# Patient Record
Sex: Female | Born: 1935
Health system: Southern US, Community
[De-identification: ages and names within clinical notes are randomized; demographics above are authoritative.]

## PROBLEM LIST (undated history)

## (undated) DIAGNOSIS — I4819 Other persistent atrial fibrillation: Secondary | ICD-10-CM

## (undated) DIAGNOSIS — E785 Hyperlipidemia, unspecified: Secondary | ICD-10-CM

## (undated) DIAGNOSIS — Z1211 Encounter for screening for malignant neoplasm of colon: Secondary | ICD-10-CM

## (undated) DIAGNOSIS — F329 Major depressive disorder, single episode, unspecified: Secondary | ICD-10-CM

## (undated) DIAGNOSIS — C801 Malignant (primary) neoplasm, unspecified: Secondary | ICD-10-CM

## (undated) DIAGNOSIS — R011 Cardiac murmur, unspecified: Secondary | ICD-10-CM

## (undated) DIAGNOSIS — K5732 Diverticulitis of large intestine without perforation or abscess without bleeding: Secondary | ICD-10-CM

## (undated) DIAGNOSIS — F32A Depression, unspecified: Secondary | ICD-10-CM

## (undated) HISTORY — DX: Encounter for screening for malignant neoplasm of colon: Z12.11

## (undated) HISTORY — DX: Diverticulitis of large intestine without perforation or abscess without bleeding: K57.32

## (undated) HISTORY — DX: Cardiac murmur, unspecified: R01.1

## (undated) HISTORY — DX: Malignant (primary) neoplasm, unspecified: C80.1

## (undated) HISTORY — DX: Depression, unspecified: F32.A

## (undated) HISTORY — DX: Major depressive disorder, single episode, unspecified: F32.9

## (undated) HISTORY — PX: CARDIAC CATHETERIZATION: SHX172

## (undated) HISTORY — DX: Hyperlipidemia, unspecified: E78.5

## (undated) HISTORY — PX: EYE SURGERY: SHX253

## (undated) HISTORY — DX: Other persistent atrial fibrillation: I48.19

---
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
## 1977-04-30 HISTORY — PX: ABDOMINAL HYSTERECTOMY: SHX81

## 1987-05-01 DIAGNOSIS — C801 Malignant (primary) neoplasm, unspecified: Secondary | ICD-10-CM

## 1987-05-01 HISTORY — DX: Malignant (primary) neoplasm, unspecified: C80.1

## 1987-05-01 HISTORY — PX: BREAST SURGERY: SHX581

## 2006-01-28 DIAGNOSIS — K5732 Diverticulitis of large intestine without perforation or abscess without bleeding: Secondary | ICD-10-CM

## 2006-01-28 HISTORY — DX: Diverticulitis of large intestine without perforation or abscess without bleeding: K57.32

## 2007-01-28 ENCOUNTER — Ambulatory Visit: Payer: Self-pay | Admitting: Internal Medicine

## 2007-04-09 ENCOUNTER — Ambulatory Visit: Payer: Self-pay | Admitting: Internal Medicine

## 2007-12-23 ENCOUNTER — Ambulatory Visit: Payer: Self-pay | Admitting: Internal Medicine

## 2008-03-11 ENCOUNTER — Ambulatory Visit: Payer: Self-pay | Admitting: Cardiovascular Disease

## 2008-03-12 ENCOUNTER — Emergency Department: Payer: Self-pay | Admitting: Emergency Medicine

## 2008-10-15 LAB — HM COLONOSCOPY

## 2010-01-24 ENCOUNTER — Ambulatory Visit: Payer: Self-pay | Admitting: Internal Medicine

## 2010-04-30 HISTORY — PX: CATARACT EXTRACTION, BILATERAL: SHX1313

## 2010-09-17 ENCOUNTER — Emergency Department: Payer: Self-pay | Admitting: Unknown Physician Specialty

## 2011-02-16 ENCOUNTER — Other Ambulatory Visit: Payer: Self-pay | Admitting: Internal Medicine

## 2011-03-02 ENCOUNTER — Other Ambulatory Visit (INDEPENDENT_AMBULATORY_CARE_PROVIDER_SITE_OTHER): Payer: Medicare Other | Admitting: *Deleted

## 2011-03-02 ENCOUNTER — Telehealth: Payer: Self-pay | Admitting: Internal Medicine

## 2011-03-02 DIAGNOSIS — N390 Urinary tract infection, site not specified: Secondary | ICD-10-CM

## 2011-03-02 LAB — POCT URINALYSIS DIPSTICK
Protein, UA: NEGATIVE
Spec Grav, UA: 1.025
Urobilinogen, UA: 0.2

## 2011-03-02 NOTE — Telephone Encounter (Signed)
Please call her back let her know that it is not a urinary tract infection.  If the back pain is severe and radiates to either leg,  She should be given an acute slot for next week. If it is not severe, and does not radiate, ask her to try tylenol 500 mg every 6 hours as needed for pain and one alleve daily and keep apt for 11/12

## 2011-03-02 NOTE — Telephone Encounter (Signed)
See below. Pt is coming in today to leave urine sample but wanted an appointment for her back pain.  She has not been the office here i made her an appointment on 03/12/11 pt wanted to know if she could be seen sooner

## 2011-03-02 NOTE — Telephone Encounter (Signed)
Patient came in for urine specimen, results have been entered and  it was neg.  Tried calling patient, but the only number that is listed in the chart has been disconnected.

## 2011-03-02 NOTE — Telephone Encounter (Signed)
[phone]   Pt called lower back pain.  Pt thinks it might be kidney infection

## 2011-03-05 NOTE — Telephone Encounter (Signed)
Patient notified. She says that the pain is too severe for her to wait until the 12th to be seen. I \\scheduled  for her to come in on wed this week to see Dr. Darrick Huntsman

## 2011-03-07 ENCOUNTER — Encounter: Payer: Self-pay | Admitting: Internal Medicine

## 2011-03-07 ENCOUNTER — Ambulatory Visit: Payer: Self-pay | Admitting: Internal Medicine

## 2011-03-07 ENCOUNTER — Ambulatory Visit: Payer: Medicare Other | Admitting: Internal Medicine

## 2011-03-07 ENCOUNTER — Ambulatory Visit (INDEPENDENT_AMBULATORY_CARE_PROVIDER_SITE_OTHER): Payer: Medicare Other | Admitting: Internal Medicine

## 2011-03-07 DIAGNOSIS — M25559 Pain in unspecified hip: Secondary | ICD-10-CM

## 2011-03-07 DIAGNOSIS — M545 Low back pain, unspecified: Secondary | ICD-10-CM

## 2011-03-07 DIAGNOSIS — E785 Hyperlipidemia, unspecified: Secondary | ICD-10-CM

## 2011-03-07 DIAGNOSIS — I10 Essential (primary) hypertension: Secondary | ICD-10-CM | POA: Insufficient documentation

## 2011-03-07 DIAGNOSIS — M810 Age-related osteoporosis without current pathological fracture: Secondary | ICD-10-CM | POA: Insufficient documentation

## 2011-03-07 DIAGNOSIS — M25551 Pain in right hip: Secondary | ICD-10-CM

## 2011-03-07 MED ORDER — TRAMADOL HCL 50 MG PO TABS: 50.0000 mg | ORAL_TABLET | Freq: Four times a day (QID) | ORAL | Status: AC | PRN

## 2011-03-07 MED ORDER — TRAMADOL HCL 50 MG PO TABS: 50.0000 mg | ORAL_TABLET | Freq: Four times a day (QID) | ORAL | Status: DC | PRN

## 2011-03-07 NOTE — Progress Notes (Signed)
  Subjective:    Patient ID: [name], female    DOB: [birth year], 77 y.o.   MRN: [id number]  HPI 77YO female with h/o osteoporosis presents with right hip and lower back pain. Pain had sudden onset when pt was sitting in reclining chair 1 week ago.  No known trauma to area. Pain in right lower back and posterior right hip. Radiates down right leg. No numbness or weakness noted. No loss of bowel or bladder continence. Pain described as severe, sharp, alleviated partially by ibuprofen.  No improvement in symptoms over last week. No other symptoms. No fever, chills.   Outpatient Encounter Prescriptions as of 03/07/2011  Medication Sig Dispense Refill  . acetaminophen (TYLENOL) 325 MG tablet Take 325 mg by mouth 3 (three) times daily.        Marland Kitchen alendronate (FOSAMAX) 70 MG tablet Take 70 mg by mouth every 7 (seven) days. Take with a full glass of water on an empty stomach.       Marland Kitchen amLODipine (NORVASC) 5 MG tablet Take 5 mg by mouth daily.        Marland Kitchen docusate sodium (COLACE) 100 MG capsule Take 100 mg by mouth daily.        . enalapril (VASOTEC) 20 MG tablet Take 20 mg by mouth daily.        Marland Kitchen ibuprofen (ADVIL,MOTRIN) 600 MG tablet Take 600 mg by mouth 3 (three) times daily as needed.        . NON FORMULARY Fiber cap one daily       . pravastatin (PRAVACHOL) 40 MG tablet Take 40 mg by mouth daily.        . benzonatate (TESSALON) 200 MG capsule TAKE ONE CAPSULE BY MOUTH THREE TIMES DAILY AS NEEDED FOR COUGH  60 capsule  1     Review of Systems  Constitutional: Negative for fever, chills and fatigue.  Respiratory: Negative for shortness of breath.   Cardiovascular: Negative for chest pain, palpitations and leg swelling.  Gastrointestinal: Negative for abdominal pain, diarrhea and constipation.  Genitourinary: Negative for dysuria, frequency and difficulty urinating.  Musculoskeletal: Positive for myalgias, back pain and arthralgias.  Skin: Negative for rash and wound.  Neurological: Negative  for dizziness and weakness.   BP 140/78  Pulse 95  Temp(Src) 97.7 F (36.5 C) (Oral)  Wt 126 lb (57.153 kg)  SpO2 96%     Objective:   Physical Exam  Constitutional: She appears well-developed and well-nourished. No distress.  HENT:  Head: Normocephalic and atraumatic.  Eyes: EOM are normal.  Neck: Normal range of motion.  Pulmonary/Chest: Effort normal and breath sounds normal.  Musculoskeletal:       Lumbar back: She exhibits decreased range of motion, tenderness and pain.       Back:  Neurological: She is alert. She has normal strength.  Reflex Scores:      Patellar reflexes are 2+ on the right side and 2+ on the left side. Skin: She is not diaphoretic.          Assessment & Plan:  1. Right hip and lower back pain - Sudden onset concerning for vertebral compression fracture. Will get plain films of lumbosacral spine and right hip.  Will add tramadol for breakthrough pain.  Will call pt with results.

## 2011-03-08 ENCOUNTER — Telehealth: Payer: Self-pay | Admitting: Internal Medicine

## 2011-03-08 DIAGNOSIS — R109 Unspecified abdominal pain: Secondary | ICD-10-CM

## 2011-03-08 NOTE — Telephone Encounter (Signed)
Xray of right hip and lumbar spine showed mild degenerative changes.  It also showed kidney stones in the right kidney. I question whether this might be causing her right hip/flank pain. I would like to get a urinalysis and a non-contrast CT abdomen to look at where this stone is and see if obstructive.

## 2011-03-08 NOTE — Telephone Encounter (Signed)
Patient informed, she wants to get urine at f/u OV on Monday. Referral entered.

## 2011-03-09 ENCOUNTER — Encounter: Payer: Self-pay | Admitting: Internal Medicine

## 2011-03-12 ENCOUNTER — Ambulatory Visit (INDEPENDENT_AMBULATORY_CARE_PROVIDER_SITE_OTHER): Payer: Medicare Other | Admitting: Internal Medicine

## 2011-03-12 ENCOUNTER — Encounter: Payer: Self-pay | Admitting: Internal Medicine

## 2011-03-12 VITALS — BP 156/68 | HR 90 | Temp 98.0°F | Resp 14 | Ht 67.0 in | Wt 124.5 lb

## 2011-03-12 DIAGNOSIS — M533 Sacrococcygeal disorders, not elsewhere classified: Secondary | ICD-10-CM

## 2011-03-12 DIAGNOSIS — Z1211 Encounter for screening for malignant neoplasm of colon: Secondary | ICD-10-CM | POA: Insufficient documentation

## 2011-03-12 DIAGNOSIS — F32A Depression, unspecified: Secondary | ICD-10-CM

## 2011-03-12 DIAGNOSIS — Z853 Personal history of malignant neoplasm of breast: Secondary | ICD-10-CM | POA: Insufficient documentation

## 2011-03-12 DIAGNOSIS — F329 Major depressive disorder, single episode, unspecified: Secondary | ICD-10-CM

## 2011-03-12 MED ORDER — CITALOPRAM HYDROBROMIDE 10 MG PO TABS: 10.0000 mg | ORAL_TABLET | Freq: Every day | ORAL | Status: DC

## 2011-03-12 NOTE — Patient Instructions (Signed)
Your pain is coming from your sacroiliac joint which appears to be having arthritis .  There are no signs of cancer on your x rays.   I suggest taking one ibuprofen 600 mg  And one acetominophen 325 mg every 6 hours if needed during the day .Marland Kitchen And save the tramadol for bedtime, which you may cut in half.   We are going to try low dose celexa to improve your appetite and mood.  Start with 1/2 tablet daily for one week, then increase to 1 tablet daily,     As long as you are not taking a laxative called bisacodyl,  Your bowel will not get addicted , but the only thing you should take on a daily basis is a fiber supplement like Metamucil, Fibercon, Miralax, , citrucel or Benefiber.  Try daily regimen of Fibercon and prunes for your bowels.

## 2011-03-12 NOTE — Progress Notes (Signed)
Subjective:    Patient ID: [name], female    DOB: 1931/10/16, 77 y.o.   MRN: [id number]  HPI  77 yo white female with histoyr of osteoporosis presents with sudden onset of severe stabbing pain in the Right flank/hip which occurring while sitting in her rocking chair 10 days ago,   Saw Dr. Dan Humphreys on Thursday and she was given tramadol for pain which has made her very drowsy but  allowed her to rest.  However she was having trouble functioning  during the day,  Has been using acetominophen alternatively .  Her pain has improved since last week and is episodic now, aggravated by prolonged stooping and sitting, and in the back of her mind is concerned about bone cancer since she has a history of breast cancer and has a strong FH of CA.  Her second complaint is lack of appetite. It is chronic and accompanied by dysphoric mood.  Past Medical History  Diagnosis Date  . Heart murmur   . Diverticulitis of colon Oct 2007    hosptialized at [facility], no surgery  . Cardiac arrhythmia   . Screening for colon cancer     last colonoscopy 2008: 2 polyps found, repeat due 2012  . Hyperlipidemia   . breast cancer 1989    s/p left mastectomy/chemo/tamoxifen x 6 yrs  . Osteoporosis 2005    Current Outpatient Prescriptions on File Prior to Visit  Medication Sig Dispense Refill  . acetaminophen (TYLENOL) 325 MG tablet Take 325 mg by mouth 3 (three) times daily.        Marland Kitchen alendronate (FOSAMAX) 70 MG tablet Take 70 mg by mouth every 7 (seven) days. Take with a full glass of water on an empty stomach.       Marland Kitchen amLODipine (NORVASC) 5 MG tablet Take 5 mg by mouth daily.        Marland Kitchen aspirin 81 MG tablet Take 81 mg by mouth daily.        . benzonatate (TESSALON) 200 MG capsule TAKE ONE CAPSULE BY MOUTH THREE TIMES DAILY AS NEEDED FOR COUGH  60 capsule  1  . calcium carbonate (OS-CAL) 600 MG TABS Take 600 mg by mouth 2 (two) times daily with a meal.        . cholecalciferol (VITAMIN D) 1000 UNITS tablet Take 1,000  Units by mouth daily.        Marland Kitchen docusate sodium (COLACE) 100 MG capsule Take 100 mg by mouth daily.        . enalapril (VASOTEC) 20 MG tablet Take 20 mg by mouth daily.        . eszopiclone (LUNESTA) 2 MG TABS Take 2 mg by mouth at bedtime. Take immediately before bedtime       . ibuprofen (ADVIL,MOTRIN) 600 MG tablet Take 600 mg by mouth 3 (three) times daily as needed.        . NON FORMULARY Fiber cap one daily       . nystatin (MYCOSTATIN) ointment Apply 1 application topically as needed.        Marland Kitchen omeprazole (PRILOSEC) 20 MG capsule Take 20 mg by mouth daily.        . pravastatin (PRAVACHOL) 40 MG tablet Take 40 mg by mouth daily.        Marland Kitchen triamcinolone (KENALOG) 0.1 % cream Apply 1 application topically 2 (two) times daily.          Review of Systems  Constitutional: Negative for fever, chills and fatigue.  Respiratory: Negative for shortness of breath.   Cardiovascular: Negative for chest pain, palpitations and leg swelling.  Gastrointestinal: Negative for abdominal pain, diarrhea and constipation.  Genitourinary: Negative for dysuria, frequency and difficulty urinating.  Musculoskeletal: Positive for myalgias, back pain and arthralgias.  Skin: Negative for rash and wound.  Neurological: Negative for dizziness and weakness.     BP 156/68  Pulse 90  Temp(Src) 98 F (36.7 C) (Oral)  Resp 14  Ht 5\' 7"  (1.702 m)  Wt 124 lb 8 oz (56.473 kg)  BMI 19.50 kg/m2  SpO2 97%  Objective:   Physical Exam  Constitutional: She appears well-developed and well-nourished. No distress.  HENT:  Head: Normocephalic and atraumatic.  Eyes: EOM are normal.  Neck: Normal range of motion.  Pulmonary/Chest: Effort normal and breath sounds normal.  Musculoskeletal:       Lumbar back: She exhibits decreased range of motion, tenderness and pain.       Back:  Neurological: She is alert. She has normal strength.  Reflex Scores:      Patellar reflexes are 2+ on the right side and 2+ on the left  side. Skin: She is not diaphoretic.          Assessment & Plan:  Sacroiliac joint pain:  Reassurance provided that there were no signs of cancer on her x ray, which was her primary concern.  Recommended regimen of analgesics and walking,  Avoidance of activities that require stopping or bending over. PT referral offered.   depressionL with anhedonia, loss of appetite.  Trial of celexa low dose. Return in one month.

## 2011-03-19 ENCOUNTER — Encounter: Payer: Self-pay | Admitting: Internal Medicine

## 2011-03-19 ENCOUNTER — Other Ambulatory Visit: Payer: Self-pay | Admitting: Internal Medicine

## 2011-03-19 DIAGNOSIS — R109 Unspecified abdominal pain: Secondary | ICD-10-CM

## 2011-03-21 ENCOUNTER — Telehealth: Payer: Self-pay | Admitting: Internal Medicine

## 2011-03-21 NOTE — Telephone Encounter (Signed)
A CT of what?

## 2011-03-21 NOTE — Telephone Encounter (Signed)
I have spoke with Cheyenne Torres, and she states she isn't having any pain so I will cancel her CT.

## 2011-03-21 NOTE — Telephone Encounter (Signed)
Sorry per the phone message on 03/08/11 Dr. Dan Humphreys, had sent her for an  Xray of right hip and lumbar spine showed mild degenerative changes. It also showed kidney stones in the right kidney. I question whether this might be causing her right hip/flank pain. I would like to get a urinalysis and a non-contrast CT abdomen to look at where this stone is and see if obstructive.  The patient wanted to make sure that you also agree with Dr. Dan Humphreys.

## 2011-03-21 NOTE — Telephone Encounter (Signed)
If she continues to have pain that is on one side or the other that is crampy, I would go ahead with the Ct abdomen and prlvis w/o contrast to rule out stones.

## 2011-03-28 ENCOUNTER — Encounter: Payer: Self-pay | Admitting: Internal Medicine

## 2011-04-05 ENCOUNTER — Other Ambulatory Visit: Payer: Self-pay | Admitting: Internal Medicine

## 2011-04-16 ENCOUNTER — Encounter: Payer: Self-pay | Admitting: Internal Medicine

## 2011-04-16 ENCOUNTER — Ambulatory Visit (INDEPENDENT_AMBULATORY_CARE_PROVIDER_SITE_OTHER): Payer: Medicare Other | Admitting: Internal Medicine

## 2011-04-16 VITALS — BP 138/72 | HR 84 | Temp 97.7°F | Ht 67.0 in | Wt 119.8 lb

## 2011-04-16 DIAGNOSIS — Z79899 Other long term (current) drug therapy: Secondary | ICD-10-CM

## 2011-04-16 DIAGNOSIS — E785 Hyperlipidemia, unspecified: Secondary | ICD-10-CM

## 2011-04-16 DIAGNOSIS — F320 Major depressive disorder, single episode, mild: Secondary | ICD-10-CM

## 2011-04-16 DIAGNOSIS — R5383 Other fatigue: Secondary | ICD-10-CM

## 2011-04-16 DIAGNOSIS — F329 Major depressive disorder, single episode, unspecified: Secondary | ICD-10-CM

## 2011-04-16 DIAGNOSIS — F32A Depression, unspecified: Secondary | ICD-10-CM

## 2011-04-16 DIAGNOSIS — R5381 Other malaise: Secondary | ICD-10-CM

## 2011-04-16 DIAGNOSIS — I10 Essential (primary) hypertension: Secondary | ICD-10-CM

## 2011-04-16 LAB — LIPID PANEL
Cholesterol: 139 mg/dL (ref 0–200)
HDL: 57.2 mg/dL
LDL Cholesterol: 60 mg/dL (ref 0–99)
Total CHOL/HDL Ratio: 2
Triglycerides: 107 mg/dL (ref 0.0–149.0)
VLDL: 21.4 mg/dL (ref 0.0–40.0)

## 2011-04-16 LAB — COMPREHENSIVE METABOLIC PANEL WITH GFR
ALT: 12 U/L (ref 0–35)
AST: 17 U/L (ref 0–37)
Albumin: 4.4 g/dL (ref 3.5–5.2)
Alkaline Phosphatase: 61 U/L (ref 39–117)
BUN: 27 mg/dL — ABNORMAL HIGH (ref 6–23)
CO2: 28 meq/L (ref 19–32)
Calcium: 9.9 mg/dL (ref 8.4–10.5)
Chloride: 103 meq/L (ref 96–112)
Creatinine, Ser: 0.9 mg/dL (ref 0.4–1.2)
GFR: 68.87 mL/min
Glucose, Bld: 132 mg/dL — ABNORMAL HIGH (ref 70–99)
Potassium: 4.2 meq/L (ref 3.5–5.1)
Sodium: 140 meq/L (ref 135–145)
Total Bilirubin: 0.5 mg/dL (ref 0.3–1.2)
Total Protein: 8.1 g/dL (ref 6.0–8.3)

## 2011-04-16 LAB — CBC WITH DIFFERENTIAL/PLATELET
Basophils Absolute: 0 10*3/uL (ref 0.0–0.1)
Eosinophils Relative: 0.8 % (ref 0.0–5.0)
HCT: 35.5 % — ABNORMAL LOW (ref 36.0–46.0)
Lymphocytes Relative: 31.4 % (ref 12.0–46.0)
Monocytes Relative: 5.7 % (ref 3.0–12.0)
Neutrophils Relative %: 61.6 % (ref 43.0–77.0)
Platelets: 231 10*3/uL (ref 150.0–400.0)
RDW: 13.5 % (ref 11.5–14.6)
WBC: 8.1 10*3/uL (ref 4.5–10.5)

## 2011-04-16 MED ORDER — TRIAMCINOLONE ACETONIDE 0.1 % EX CREA: 1.0000 "application " | TOPICAL_CREAM | Freq: Two times a day (BID) | CUTANEOUS | Status: DC

## 2011-04-16 MED ORDER — CITALOPRAM HYDROBROMIDE 10 MG PO TABS: 10.0000 mg | ORAL_TABLET | Freq: Every day | ORAL | Status: DC

## 2011-04-16 MED ORDER — CYANOCOBALAMIN 1000 MCG/ML IJ SOLN: 1000.0000 ug | INTRAMUSCULAR | Status: DC

## 2011-04-16 NOTE — Assessment & Plan Note (Addendum)
Managed with pravastatin, goal LDL < 100.  Repeat lipids and CMET  are due today.

## 2011-04-16 NOTE — Patient Instructions (Signed)
If you decided to taper yourself off oc citalopram this is how you do it:   Your blood pressure is fine,  No refills needed.  If the B12 shots give you energy,  Keep getting them once a month at the health Dept .  Ask the health Dept about a TDaP vaccine.

## 2011-04-16 NOTE — Assessment & Plan Note (Addendum)
improved symptomatology with trial of celexa.  She is sleeping better but continues to have fatigue.  Contineu celexa, encouraged tiral of b12 injections and daily exercise.

## 2011-04-16 NOTE — Progress Notes (Signed)
Subjective:    Patient ID: Cheyenne Torres, female    DOB: 1931-05-04, 77 y.o.   MRN: [id number]  HPI  Cheyenne Torres  returns for follow up on multiple chronic issues including hypertension,  DHD hip, and chronic fatigue with normla serologic workup.  At last visit I recommended a trial of celexa to see if the fatigue was really depression with anxiety features.  She is toleraing the celexa and feels tha it is helping her sleep.  With Christmas next week, she has No holiday optimism or cheer bc she always  Has to feed 20 realtives a  breakfast on Christmas Eve and worries that her daughter won't get to her seconda stop in time afterward.  States that it tires her out just thinking about it and she always sleeps the resot of the day.  Her hip pain has resolved, and is no longer giving her tourble.   She now worried about the present of asymptomatic renal calculi seen on recent study..   Past Medical History  Diagnosis Date  . Heart murmur   . Diverticulitis of colon Oct 2007    hosptialized at [facility], no surgery  . Cardiac arrhythmia   . Screening for colon cancer     last colonoscopy 2008: 2 polyps found, repeat due 2012  . Hyperlipidemia   . breast cancer 1989    s/p left mastectomy/chemo/tamoxifen x 6 yrs  . Osteoporosis 2005   Current Outpatient Prescriptions on File Prior to Visit  Medication Sig Dispense Refill  . acetaminophen (TYLENOL) 325 MG tablet Take 325 mg by mouth as needed.       Marland Kitchen alendronate (FOSAMAX) 70 MG tablet Take 70 mg by mouth every 7 (seven) days. Take with a full glass of water on an empty stomach.       Marland Kitchen amLODipine (NORVASC) 5 MG tablet Take 5 mg by mouth daily.        Marland Kitchen aspirin 81 MG tablet Take 81 mg by mouth daily.        . calcium carbonate (OS-CAL) 600 MG TABS Take 600 mg by mouth 2 (two) times daily with a meal.        . cholecalciferol (VITAMIN D) 1000 UNITS tablet Take 1,000 Units by mouth daily.        Marland Kitchen docusate sodium (COLACE) 100 MG capsule Take 100 mg by  mouth daily.        . enalapril (VASOTEC) 20 MG tablet Take 20 mg by mouth daily.        . NON FORMULARY Fiber cap one daily       . nystatin (MYCOSTATIN) ointment Apply 1 application topically as needed.        . pravastatin (PRAVACHOL) 40 MG tablet TAKE ONE TABLET BY MOUTH EVERY DAY AT BEDTIME  90 tablet  3  . benzonatate (TESSALON) 200 MG capsule TAKE ONE CAPSULE BY MOUTH THREE TIMES DAILY AS NEEDED FOR COUGH  60 capsule  1  . eszopiclone (LUNESTA) 2 MG TABS Take 2 mg by mouth at bedtime. Take immediately before bedtime       . ibuprofen (ADVIL,MOTRIN) 600 MG tablet Take 600 mg by mouth 3 (three) times daily as needed.        Marland Kitchen omeprazole (PRILOSEC) 20 MG capsule Take 20 mg by mouth daily.            Review of Systems  Constitutional: Positive for fatigue. Negative for fever, chills and unexpected weight change.  HENT: Negative  for hearing loss, ear pain, nosebleeds, congestion, sore throat, facial swelling, rhinorrhea, sneezing, mouth sores, trouble swallowing, neck pain, neck stiffness, voice change, postnasal drip, sinus pressure, tinnitus and ear discharge.   Eyes: Negative for pain, discharge, redness and visual disturbance.  Respiratory: Negative for cough, chest tightness, shortness of breath, wheezing and stridor.   Cardiovascular: Negative for chest pain, palpitations and leg swelling.  Musculoskeletal: Negative for myalgias and arthralgias.  Skin: Negative for color change and rash.  Neurological: Negative for dizziness, weakness, light-headedness and headaches.  Hematological: Negative for adenopathy.  Psychiatric/Behavioral: Negative for sleep disturbance.        BP 138/72  Pulse 84  Temp(Src) 97.7 F (36.5 C) (Oral)  Ht 5\' 7"  (1.702 m)  Wt 119 lb 12 oz (54.318 kg)  BMI 18.76 kg/m2     Objective:   Physical Exam  Constitutional: She is oriented to person, place, and time. She appears well-developed and well-nourished.  HENT:  Mouth/Throat: Oropharynx is clear  and moist.  Eyes: EOM are normal. Pupils are equal, round, and reactive to light. No scleral icterus.  Neck: Normal range of motion. Neck supple. No JVD present. No thyromegaly present.  Cardiovascular: Normal rate, regular rhythm, normal heart sounds and intact distal pulses.   Pulmonary/Chest: Effort normal and breath sounds normal.  Abdominal: Soft. Bowel sounds are normal. She exhibits no mass. There is no tenderness.  Musculoskeletal: Normal range of motion. She exhibits no edema.  Lymphadenopathy:    She has no cervical adenopathy.  Neurological: She is alert and oriented to person, place, and time.  Skin: Skin is warm and dry.  Psychiatric: She has a normal mood and affect.       Assessment & Plan:   Hyperlipidemia Managed with pravastatin, goal LDL < 100.  Repeat lipids and CMET  are due today.   Hypertension Well controlled with no side effects and ACE INhibitor and amlodipine  Depression, major, single episode, mild improved symptomatology with trial of celexa.  She is sleeping better but continues to have fatigue.  Contineu celexa, encouraged tiral of b12 injections and daily exercise.     Updated Medication List Outpatient Encounter Prescriptions as of 04/16/2011  Medication Sig Dispense Refill  . acetaminophen (TYLENOL) 325 MG tablet Take 325 mg by mouth as needed.       Marland Kitchen alendronate (FOSAMAX) 70 MG tablet Take 70 mg by mouth every 7 (seven) days. Take with a full glass of water on an empty stomach.       Marland Kitchen amLODipine (NORVASC) 5 MG tablet Take 5 mg by mouth daily.        Marland Kitchen aspirin 81 MG tablet Take 81 mg by mouth daily.        . calcium carbonate (OS-CAL) 600 MG TABS Take 600 mg by mouth 2 (two) times daily with a meal.        . cholecalciferol (VITAMIN D) 1000 UNITS tablet Take 1,000 Units by mouth daily.        . citalopram (CELEXA) 10 MG tablet Take 1 tablet (10 mg total) by mouth daily.  30 tablet  6  . docusate sodium (COLACE) 100 MG capsule Take 100 mg by  mouth daily.        . enalapril (VASOTEC) 20 MG tablet Take 20 mg by mouth daily.        . NON FORMULARY Fiber cap one daily       . nystatin (MYCOSTATIN) ointment Apply 1 application topically as needed.        Marland Kitchen  pravastatin (PRAVACHOL) 40 MG tablet TAKE ONE TABLET BY MOUTH EVERY DAY AT BEDTIME  90 tablet  3  . triamcinolone cream (KENALOG) 0.1 % Apply 1 application topically 2 (two) times daily. As needed for itching  30 g  3  . DISCONTD: citalopram (CELEXA) 10 MG tablet Take 1 tablet (10 mg total) by mouth daily.  30 tablet  2  . DISCONTD: triamcinolone (KENALOG) 0.1 % cream Apply 1 application topically 2 (two) times daily.        . benzonatate (TESSALON) 200 MG capsule TAKE ONE CAPSULE BY MOUTH THREE TIMES DAILY AS NEEDED FOR COUGH  60 capsule  1  . cyanocobalamin (,VITAMIN B-12,) 1000 MCG/ML injection Inject 1 mL (1,000 mcg total) into the muscle every 30 (thirty) days.  10 mL  2  . eszopiclone (LUNESTA) 2 MG TABS Take 2 mg by mouth at bedtime. Take immediately before bedtime       . ibuprofen (ADVIL,MOTRIN) 600 MG tablet Take 600 mg by mouth 3 (three) times daily as needed.        Marland Kitchen omeprazole (PRILOSEC) 20 MG capsule Take 20 mg by mouth daily.

## 2011-04-16 NOTE — Assessment & Plan Note (Signed)
Well controlled with no side effects and ACE INhibitor and amlodipine

## 2011-04-17 ENCOUNTER — Encounter: Payer: Self-pay | Admitting: Internal Medicine

## 2011-05-02 ENCOUNTER — Other Ambulatory Visit (INDEPENDENT_AMBULATORY_CARE_PROVIDER_SITE_OTHER): Payer: Medicare Other | Admitting: *Deleted

## 2011-05-02 DIAGNOSIS — E119 Type 2 diabetes mellitus without complications: Secondary | ICD-10-CM

## 2011-05-02 LAB — HEMOGLOBIN A1C
Hgb A1c MFr Bld: 6.5 % — ABNORMAL HIGH (ref <5.7)
Mean Plasma Glucose: 140 mg/dL — ABNORMAL HIGH (ref <117)

## 2011-05-02 NOTE — Progress Notes (Signed)
Addended by: Jobie Quaker on: 05/02/2011 10:53 AM   Modules accepted: Orders

## 2011-05-03 LAB — URINALYSIS
Glucose, UA: NEGATIVE mg/dL
Leukocytes, UA: NEGATIVE
Protein, ur: NEGATIVE mg/dL
pH: 6 (ref 5.0–8.0)

## 2011-05-03 LAB — MICROALBUMIN / CREATININE URINE RATIO
Creatinine, Urine: 189.8 mg/dL
Microalb Creat Ratio: 23.6 mg/g (ref 0.0–30.0)
Microalb, Ur: 4.47 mg/dL — ABNORMAL HIGH (ref 0.00–1.89)

## 2011-05-07 ENCOUNTER — Ambulatory Visit: Payer: Self-pay | Admitting: Internal Medicine

## 2011-06-09 ENCOUNTER — Emergency Department: Payer: Self-pay | Admitting: Emergency Medicine

## 2011-06-09 LAB — CBC
HCT: 33.3 % — ABNORMAL LOW (ref 35.0–47.0)
HGB: 11.2 g/dL — ABNORMAL LOW (ref 12.0–16.0)
MCHC: 33.5 g/dL (ref 32.0–36.0)
MCV: 87 fL (ref 80–100)
Platelet: 196 10*3/uL (ref 150–440)
RBC: 3.85 10*6/uL (ref 3.80–5.20)
WBC: 8.6 10*3/uL (ref 3.6–11.0)

## 2011-06-09 LAB — URINALYSIS, COMPLETE
Bacteria: NONE SEEN
Ketone: NEGATIVE
Ph: 5 (ref 4.5–8.0)
Protein: 30
RBC,UR: 35 /HPF (ref 0–5)
Specific Gravity: 1.018 (ref 1.003–1.030)

## 2011-06-09 LAB — COMPREHENSIVE METABOLIC PANEL
Albumin: 4 g/dL (ref 3.4–5.0)
Alkaline Phosphatase: 58 U/L (ref 50–136)
Anion Gap: 11 (ref 7–16)
BUN: 20 mg/dL — ABNORMAL HIGH (ref 7–18)
Bilirubin,Total: 0.4 mg/dL (ref 0.2–1.0)
Calcium, Total: 8.8 mg/dL (ref 8.5–10.1)
Co2: 28 mmol/L (ref 21–32)
Creatinine: 0.82 mg/dL (ref 0.60–1.30)
EGFR (Non-African Amer.): >60
Glucose: 123 mg/dL — ABNORMAL HIGH (ref 65–99)
Osmolality: 285 (ref 275–301)
Potassium: 3.4 mmol/L — ABNORMAL LOW (ref 3.5–5.1)
Sodium: 141 mmol/L (ref 136–145)
Total Protein: 7.9 g/dL (ref 6.4–8.2)

## 2011-06-09 LAB — LIPASE, BLOOD: Lipase: 67 U/L — ABNORMAL LOW (ref 73–393)

## 2011-06-18 ENCOUNTER — Ambulatory Visit (INDEPENDENT_AMBULATORY_CARE_PROVIDER_SITE_OTHER): Payer: Medicare Other | Admitting: Internal Medicine

## 2011-06-18 ENCOUNTER — Encounter: Payer: Self-pay | Admitting: Internal Medicine

## 2011-06-18 VITALS — BP 122/50 | HR 80 | Temp 98.2°F | Wt 119.0 lb

## 2011-06-18 DIAGNOSIS — F32A Depression, unspecified: Secondary | ICD-10-CM

## 2011-06-18 DIAGNOSIS — F329 Major depressive disorder, single episode, unspecified: Secondary | ICD-10-CM

## 2011-06-18 DIAGNOSIS — N200 Calculus of kidney: Secondary | ICD-10-CM

## 2011-06-18 DIAGNOSIS — K8020 Calculus of gallbladder without cholecystitis without obstruction: Secondary | ICD-10-CM

## 2011-06-18 HISTORY — DX: Calculus of kidney: N20.0

## 2011-06-18 MED ORDER — ESZOPICLONE 2 MG PO TABS: 2.0000 mg | ORAL_TABLET | Freq: Every day | ORAL | Status: DC

## 2011-06-18 MED ORDER — AMLODIPINE BESYLATE 5 MG PO TABS: 5.0000 mg | ORAL_TABLET | Freq: Every day | ORAL | Status: DC

## 2011-06-18 MED ORDER — CITALOPRAM HYDROBROMIDE 10 MG PO TABS: 10.0000 mg | ORAL_TABLET | Freq: Every day | ORAL | Status: DC

## 2011-06-18 NOTE — Progress Notes (Signed)
Subjective:    Patient ID: Cheyenne Torres, female    DOB: [birth year], 78 y.o.   MRN: [id number]  HPI  Ms. Cheyenne Torres is a 78 yr old white female with a history of hypertension and diabetes who presents for an ER followup.  On Saturday Feb 9she went to the ER with suprapubic pain and dysuria and was evaluated  For presumed kidney stone.  She was treated with tramadol and ciprofloxacin for pyuria.  Noncontrasted CT did not reveal any renal calculi or diverticulitis.  Gallstones were noted but her pain was not RUQ and LFTs were normal .   Past Medical History  Diagnosis Date  . Heart murmur   . Diverticulitis of colon Oct 2007    hosptialized at [facility], no surgery  . Cardiac arrhythmia   . Screening for colon cancer     last colonoscopy 2008: 2 polyps found, repeat due 2012  . Hyperlipidemia   . breast cancer 1989    s/p left mastectomy/chemo/tamoxifen x 6 yrs  . Osteoporosis 2005   Current Outpatient Prescriptions on File Prior to Visit  Medication Sig Dispense Refill  . acetaminophen (TYLENOL) 325 MG tablet Take 325 mg by mouth as needed.       Marland Kitchen alendronate (FOSAMAX) 70 MG tablet Take 70 mg by mouth every 7 (seven) days. Take with a full glass of water on an empty stomach.       Marland Kitchen aspirin 81 MG tablet Take 81 mg by mouth daily.        . benzonatate (TESSALON) 200 MG capsule TAKE ONE CAPSULE BY MOUTH THREE TIMES DAILY AS NEEDED FOR COUGH  60 capsule  1  . calcium carbonate (OS-CAL) 600 MG TABS Take 600 mg by mouth 2 (two) times daily with a meal.        . cholecalciferol (VITAMIN D) 1000 UNITS tablet Take 1,000 Units by mouth daily.        . cyanocobalamin (,VITAMIN B-12,) 1000 MCG/ML injection Inject 1 mL (1,000 mcg total) into the muscle every 30 (thirty) days.  10 mL  2  . docusate sodium (COLACE) 100 MG capsule Take 100 mg by mouth daily.        . enalapril (VASOTEC) 20 MG tablet Take 20 mg by mouth daily.        Marland Kitchen ibuprofen (ADVIL,MOTRIN) 600 MG tablet Take 600 mg by mouth 3 (three) times  daily as needed.        . NON FORMULARY Fiber cap one daily       . nystatin (MYCOSTATIN) ointment Apply 1 application topically as needed.        Marland Kitchen omeprazole (PRILOSEC) 20 MG capsule Take 20 mg by mouth daily.        . pravastatin (PRAVACHOL) 40 MG tablet TAKE ONE TABLET BY MOUTH EVERY DAY AT BEDTIME  90 tablet  3  . triamcinolone cream (KENALOG) 0.1 % Apply 1 application topically 2 (two) times daily. As needed for itching  30 g  3    Review of Systems  Constitutional: Negative for fever, chills and unexpected weight change.  HENT: Negative for hearing loss, ear pain, nosebleeds, congestion, sore throat, facial swelling, rhinorrhea, sneezing, mouth sores, trouble swallowing, neck pain, neck stiffness, voice change, postnasal drip, sinus pressure, tinnitus and ear discharge.   Eyes: Negative for pain, discharge, redness and visual disturbance.  Respiratory: Negative for cough, chest tightness, shortness of breath, wheezing and stridor.   Cardiovascular: Negative for chest pain, palpitations and leg  swelling.  Musculoskeletal: Negative for myalgias and arthralgias.  Skin: Negative for color change and rash.  Neurological: Negative for dizziness, weakness, light-headedness and headaches.  Hematological: Negative for adenopathy.       Objective:   Physical Exam  Constitutional: She is oriented to person, place, and time. She appears well-developed and well-nourished.  HENT:  Mouth/Throat: Oropharynx is clear and moist.  Eyes: EOM are normal. Pupils are equal, round, and reactive to light. No scleral icterus.  Neck: Normal range of motion. Neck supple. No JVD present. No thyromegaly present.  Cardiovascular: Normal rate, regular rhythm, normal heart sounds and intact distal pulses.   Pulmonary/Chest: Effort normal and breath sounds normal.  Abdominal: Soft. Bowel sounds are normal. She exhibits no mass. There is no tenderness.  Musculoskeletal: Normal range of motion. She exhibits no  edema.  Lymphadenopathy:    She has no cervical adenopathy.  Neurological: She is alert and oriented to person, place, and time.  Skin: Skin is warm and dry.  Psychiatric: She has a normal mood and affect.      Assessment & Plan:   Nephrolithiasis Suspected.  I reviewed the CT scan and labs done by ER physician, She was presumed to have passed the stone prior to ER evaluation with CT scan given abnormal UA , signs and symptoms and negative urine culture. Advised to maintain hydration.  Gallstones without obstruction of gallbladder Incidental finding on CT done Feb 9 during ER visit for suprapubic pain .  Reassurance provided.    Updated Medication List Outpatient Encounter Prescriptions as of 06/18/2011  Medication Sig Dispense Refill  . acetaminophen (TYLENOL) 325 MG tablet Take 325 mg by mouth as needed.       Marland Kitchen alendronate (FOSAMAX) 70 MG tablet Take 70 mg by mouth every 7 (seven) days. Take with a full glass of water on an empty stomach.       Marland Kitchen amLODipine (NORVASC) 5 MG tablet Take 1 tablet (5 mg total) by mouth daily.  90 tablet  3  . aspirin 81 MG tablet Take 81 mg by mouth daily.        . benzonatate (TESSALON) 200 MG capsule TAKE ONE CAPSULE BY MOUTH THREE TIMES DAILY AS NEEDED FOR COUGH  60 capsule  1  . calcium carbonate (OS-CAL) 600 MG TABS Take 600 mg by mouth 2 (two) times daily with a meal.        . cholecalciferol (VITAMIN D) 1000 UNITS tablet Take 1,000 Units by mouth daily.        . ciprofloxacin (CIPRO) 500 MG tablet Take 500 mg by mouth 2 (two) times daily.      . citalopram (CELEXA) 10 MG tablet Take 1 tablet (10 mg total) by mouth daily.  90 tablet  3  . cyanocobalamin (,VITAMIN B-12,) 1000 MCG/ML injection Inject 1 mL (1,000 mcg total) into the muscle every 30 (thirty) days.  10 mL  2  . docusate sodium (COLACE) 100 MG capsule Take 100 mg by mouth daily.        . enalapril (VASOTEC) 20 MG tablet Take 20 mg by mouth daily.        . eszopiclone (LUNESTA) 2 MG TABS  Take 1 tablet (2 mg total) by mouth at bedtime. Take immediately before bedtime  90 tablet  2  . ibuprofen (ADVIL,MOTRIN) 600 MG tablet Take 600 mg by mouth 3 (three) times daily as needed.        . NON FORMULARY Fiber cap one daily       .  nystatin (MYCOSTATIN) ointment Apply 1 application topically as needed.        Marland Kitchen omeprazole (PRILOSEC) 20 MG capsule Take 20 mg by mouth daily.        . pravastatin (PRAVACHOL) 40 MG tablet TAKE ONE TABLET BY MOUTH EVERY DAY AT BEDTIME  90 tablet  3  . traMADol (ULTRAM) 50 MG tablet Take 50 mg by mouth every 6 (six) hours as needed.      . triamcinolone cream (KENALOG) 0.1 % Apply 1 application topically 2 (two) times daily. As needed for itching  30 g  3  . DISCONTD: amLODipine (NORVASC) 5 MG tablet Take 5 mg by mouth daily.        Marland Kitchen DISCONTD: citalopram (CELEXA) 10 MG tablet Take 1 tablet (10 mg total) by mouth daily.  30 tablet  6  . DISCONTD: eszopiclone (LUNESTA) 2 MG TABS Take 2 mg by mouth at bedtime. Take immediately before bedtime

## 2011-06-18 NOTE — Assessment & Plan Note (Addendum)
Suspected.  I reviewed the CT scan and labs done by ER physician, She was presumed to have passed the stone prior to ER evaluation with CT scan given abnormal UA , signs and symptoms and negative urine culture. Advised to maintain hydration.

## 2011-06-18 NOTE — Assessment & Plan Note (Signed)
Incidental finding on CT done Feb 9 during ER visit for suprapubic pain .  Reassurance provided.

## 2011-06-18 NOTE — Patient Instructions (Signed)
You evidently passed a kidney stone before you had your CT scan. You can stop the ciprofloxacin now,  Hold onto those remaining tablets for  the next time .    Buy some G2 (it is gatorade without al  The key to preventing more kidney stones is staying well hydrated

## 2011-10-07 LAB — HM DIABETES EYE EXAM: HM Diabetic Eye Exam: NORMAL

## 2011-10-16 ENCOUNTER — Ambulatory Visit (INDEPENDENT_AMBULATORY_CARE_PROVIDER_SITE_OTHER): Payer: Medicare Other | Admitting: Internal Medicine

## 2011-10-16 ENCOUNTER — Encounter: Payer: Self-pay | Admitting: Internal Medicine

## 2011-10-16 VITALS — BP 140/66 | HR 79 | Temp 98.0°F | Resp 16 | Wt 112.5 lb

## 2011-10-16 DIAGNOSIS — E785 Hyperlipidemia, unspecified: Secondary | ICD-10-CM

## 2011-10-16 DIAGNOSIS — E559 Vitamin D deficiency, unspecified: Secondary | ICD-10-CM

## 2011-10-16 DIAGNOSIS — Z8719 Personal history of other diseases of the digestive system: Secondary | ICD-10-CM

## 2011-10-16 DIAGNOSIS — C801 Malignant (primary) neoplasm, unspecified: Secondary | ICD-10-CM

## 2011-10-16 DIAGNOSIS — R634 Abnormal weight loss: Secondary | ICD-10-CM

## 2011-10-16 DIAGNOSIS — N951 Menopausal and female climacteric states: Secondary | ICD-10-CM

## 2011-10-16 DIAGNOSIS — I10 Essential (primary) hypertension: Secondary | ICD-10-CM

## 2011-10-16 DIAGNOSIS — K8020 Calculus of gallbladder without cholecystitis without obstruction: Secondary | ICD-10-CM

## 2011-10-16 DIAGNOSIS — E119 Type 2 diabetes mellitus without complications: Secondary | ICD-10-CM

## 2011-10-16 DIAGNOSIS — F320 Major depressive disorder, single episode, mild: Secondary | ICD-10-CM

## 2011-10-16 DIAGNOSIS — R232 Flushing: Secondary | ICD-10-CM

## 2011-10-16 LAB — CBC WITH DIFFERENTIAL/PLATELET
Basophils Relative: 0.7 % (ref 0.0–3.0)
Eosinophils Relative: 2.3 % (ref 0.0–5.0)
Hemoglobin: 11.7 g/dL — ABNORMAL LOW (ref 12.0–15.0)
Lymphocytes Relative: 34.8 % (ref 12.0–46.0)
MCHC: 33.2 g/dL (ref 30.0–36.0)
Monocytes Relative: 6.3 % (ref 3.0–12.0)
Neutro Abs: 3.8 10*3/uL (ref 1.4–7.7)
RBC: 4.06 Mil/uL (ref 3.87–5.11)

## 2011-10-16 LAB — COMPLETE METABOLIC PANEL WITH GFR
ALT: 9 U/L (ref 0–35)
CO2: 27 mEq/L (ref 19–32)
Creat: 0.83 mg/dL (ref 0.50–1.10)
GFR, Est African American: 79 mL/min
Total Bilirubin: 0.5 mg/dL (ref 0.3–1.2)

## 2011-10-16 LAB — TSH: TSH: 2.14 u[IU]/mL (ref 0.35–5.50)

## 2011-10-16 LAB — LIPID PANEL
HDL: 59.2 mg/dL (ref >39.00)
LDL Cholesterol: 45 mg/dL (ref 0–99)
Total CHOL/HDL Ratio: 2
Triglycerides: 137 mg/dL (ref 0.0–149.0)
VLDL: 27.4 mg/dL (ref 0.0–40.0)

## 2011-10-16 MED ORDER — ENALAPRIL MALEATE 20 MG PO TABS: 20.0000 mg | ORAL_TABLET | Freq: Every day | ORAL | Status: DC

## 2011-10-16 MED ORDER — CITALOPRAM HYDROBROMIDE 20 MG PO TABS: 20.0000 mg | ORAL_TABLET | Freq: Every day | ORAL | Status: DC

## 2011-10-16 MED ORDER — ALENDRONATE SODIUM 70 MG PO TABS: 70.0000 mg | ORAL_TABLET | ORAL | Status: DC

## 2011-10-16 NOTE — Patient Instructions (Addendum)
Increase your citalopram from 10 mg daily to 20 mg daily   I am sending you for a chest x ray  Because of your weight loss

## 2011-10-16 NOTE — Assessment & Plan Note (Addendum)
We discussing increasing her italopram to 20 mg daily and will have her return in one month

## 2011-10-16 NOTE — Progress Notes (Signed)
Patient ID: Cheyenne Torres, female   DOB: 09-08-1931, 78 y.o.   MRN: [id number] Patient Active Problem List  Diagnosis  . Osteoporosis  . Hypertension  . Hyperlipidemia  . Screening for colon cancer  . breast cancer  . Depression, major, single episode, mild  . Nephrolithiasis  . Gallstones without obstruction of gallbladder  . Weight loss, unintentional  . Diabetes mellitus type 2, diet-controlled    Subjective:  CC:   Chief Complaint  Patient presents with  . Follow-up    6 month    HPI:   Cheyenne Torres a 78 y.o. female who presents for 6 month follow up on depression/anxiety, hypertensio, diabetes, diet controlled and hyperlipidemia.   She has lost another 5 lbs,  Total of 14 since November.  Her appetite is  poor.  Chronic insomnia still a problem, she is reluctant to take her lunesta daily due to fear of addiction.  She is tolerating citalopram dose at 10 mg daily.  Started having hot flashes 2 months ago, and wakes up in the middle of the night. .  She had cataract surgery bilaterlaly with good results, by her eye  Dr. [place].   Past Medical History  Diagnosis Date  . Heart murmur   . Diverticulitis of colon Oct 2007    hosptialized at [facility], no surgery  . Cardiac arrhythmia   . Screening for colon cancer     last colonoscopy 2008: 2 polyps found, repeat due 2012  . Hyperlipidemia   . breast cancer 1989    s/p left mastectomy/chemo/tamoxifen x 6 yrs  . Osteoporosis 2005    Past Surgical History  Procedure Date  . Cosmetic surgery   . Breast surgery 1989    benign biopsy  . Appendectomy 1950  . Abdominal hysterectomy 1979         The following portions of the patient's history were reviewed and updated as appropriate: Allergies, current medications, and problem list.    Review of Systems:   12 Pt  review of systems was negative except those addressed in the HPI,     History   Social History  . Marital Status: Married    Spouse  Name: N/A    Number of Children: N/A  . Years of Education: N/A   Occupational History  . Not on file.   Social History Main Topics  . Smoking status: Never Smoker   . Smokeless tobacco: Never Used  . Alcohol Use: No  . Drug Use: No  . Sexually Active: Not on file   Other Topics Concern  . Not on file   Social History Narrative   Daily Caffeine Use:  NONERegular Exercise -  NO    Objective:  BP 140/66  Pulse 79  Temp 98 F (36.7 C) (Oral)  Resp 16  Wt 112 lb 8 oz (51.03 kg)  SpO2 96%  General appearance: alert, cooperative and appears stated age Ears: normal TM's and external ear canals both ears Throat: lips, mucosa, and tongue normal; teeth and gums normal Neck: no adenopathy, no carotid bruit, supple, symmetrical, trachea midline and thyroid not enlarged, symmetric, no tenderness/mass/nodules Back: symmetric, no curvature. ROM normal. No CVA tenderness. Lungs: clear to auscultation bilaterally Heart: regular rate and rhythm, S1, S2 normal, no murmur, click, rub or gallop Abdomen: soft, non-tender; bowel sounds normal; no masses,  no organomegaly Pulses: 2+ and symmetric Skin: Skin color, texture, turgor normal. No rashes or lesions Lymph nodes: Cervical, supraclavicular, and axillary nodes  normal.  Assessment and Plan: Depression, major, single episode, mild We discussed increasing  Her citalopram to 20 mg daily and will have her return in one month  breast cancer s/p left mastectomy/chemo/tamoxifen x 6 yrs.  Given her rapid wt loss I am concerned about metastasis and will order chest, abdomen and pelvic CT. Last known mammogram was Sept 2011   Weight loss, unintentional Given her history of  breast cancer 6 years ago, gallstones and rapid wt loss I am concerned about metastasis vs chronci cholecystitisand will order chest, abdomen and pelvic CT.    Hypertension Well controlled for age on current medications.  No changes today.  Hyperlipidemia Well  controlled on pravastatin,  LDL is 45.  No changes today.  Diabetes mellitus type 2, diet-controlled hgba1c was < 7.0 in January and is 6.5 today.   Gallstones without obstruction of gallbladder By CT done in Feb 2013 , noncontrasted, done due to history of right flank pain.  LFTS are normal today but with wt loss I am concerned that chronic cholecystitis may be occurring.  Ct ordered.    Updated Medication List Outpatient Encounter Prescriptions as of 10/16/2011  Medication Sig Dispense Refill  . acetaminophen (TYLENOL) 325 MG tablet Take 325 mg by mouth as needed.       Marland Kitchen alendronate (FOSAMAX) 70 MG tablet Take 1 tablet (70 mg total) by mouth every 7 (seven) days. Take with a full glass of water on an empty stomach.  4 tablet  6  . amLODipine (NORVASC) 5 MG tablet Take 1 tablet (5 mg total) by mouth daily.  90 tablet  3  . aspirin 81 MG tablet Take 81 mg by mouth daily.        . benzonatate (TESSALON) 200 MG capsule TAKE ONE CAPSULE BY MOUTH THREE TIMES DAILY AS NEEDED FOR COUGH  60 capsule  1  . calcium carbonate (OS-CAL) 600 MG TABS Take 600 mg by mouth 2 (two) times daily with a meal.        . cholecalciferol (VITAMIN D) 1000 UNITS tablet Take 1,000 Units by mouth daily.        Marland Kitchen docusate sodium (COLACE) 100 MG capsule Take 100 mg by mouth daily.        . enalapril (VASOTEC) 20 MG tablet Take 1 tablet (20 mg total) by mouth daily.  30 tablet  6  . eszopiclone (LUNESTA) 2 MG TABS Take 1 tablet (2 mg total) by mouth at bedtime. Take immediately before bedtime  90 tablet  2  . ibuprofen (ADVIL,MOTRIN) 600 MG tablet Take 600 mg by mouth 3 (three) times daily as needed.        . NON FORMULARY Fiber cap one daily       . nystatin (MYCOSTATIN) ointment Apply 1 application topically as needed.        Marland Kitchen omeprazole (PRILOSEC) 20 MG capsule Take 20 mg by mouth daily.        . pravastatin (PRAVACHOL) 40 MG tablet TAKE ONE TABLET BY MOUTH EVERY DAY AT BEDTIME  90 tablet  3  . traMADol (ULTRAM) 50  MG tablet Take 50 mg by mouth every 6 (six) hours as needed.      . triamcinolone cream (KENALOG) 0.1 % Apply 1 application topically 2 (two) times daily. As needed for itching  30 g  3  . DISCONTD: alendronate (FOSAMAX) 70 MG tablet Take 70 mg by mouth every 7 (seven) days. Take with a full glass of water on  an empty stomach.       . DISCONTD: citalopram (CELEXA) 10 MG tablet Take 1 tablet (10 mg total) by mouth daily.  90 tablet  3  . DISCONTD: enalapril (VASOTEC) 20 MG tablet Take 20 mg by mouth daily.        . citalopram (CELEXA) 20 MG tablet Take 1 tablet (20 mg total) by mouth daily.  90 tablet  3  . ketorolac (ACULAR) 0.4 % SOLN       . prednisoLONE acetate (PRED FORTE) 1 % ophthalmic suspension       . ZYMAXID 0.5 % SOLN       . DISCONTD: ciprofloxacin (CIPRO) 500 MG tablet Take 500 mg by mouth 2 (two) times daily.      Marland Kitchen DISCONTD: cyanocobalamin (,VITAMIN B-12,) 1000 MCG/ML injection Inject 1 mL (1,000 mcg total) into the muscle every 30 (thirty) days.  10 mL  2

## 2011-10-17 ENCOUNTER — Encounter: Payer: Self-pay | Admitting: Internal Medicine

## 2011-10-17 DIAGNOSIS — R634 Abnormal weight loss: Secondary | ICD-10-CM | POA: Insufficient documentation

## 2011-10-17 DIAGNOSIS — E559 Vitamin D deficiency, unspecified: Secondary | ICD-10-CM | POA: Insufficient documentation

## 2011-10-17 DIAGNOSIS — E1121 Type 2 diabetes mellitus with diabetic nephropathy: Secondary | ICD-10-CM | POA: Insufficient documentation

## 2011-10-17 MED ORDER — ERGOCALCIFEROL 1.25 MG (50000 UT) PO CAPS: 50000.0000 [IU] | ORAL_CAPSULE | ORAL | Status: AC

## 2011-10-17 NOTE — Assessment & Plan Note (Addendum)
s/p left mastectomy/chemo/tamoxifen x 6 yrs.  Given her rapid wt loss I am concerned about metastasis and will order chest, abdomen and pelvic CT. Last known mammogram was Sept 2011

## 2011-10-17 NOTE — Assessment & Plan Note (Signed)
Well controlled for age on current medications.  No changes today.

## 2011-10-17 NOTE — Addendum Note (Signed)
Addended by: Duncan Dull on: 10/17/2011 07:21 AM   Modules accepted: Orders

## 2011-10-17 NOTE — Assessment & Plan Note (Signed)
Well controlled on pravastatin,  LDL is 45.  No changes today.

## 2011-10-17 NOTE — Assessment & Plan Note (Signed)
By CT done in Feb 2013 , noncontrasted, done due to history of right flank pain.  LFTS are normal today but with wt loss I am concerned that chronic cholecystitis may be occurring.  Ct ordered.

## 2011-10-17 NOTE — Assessment & Plan Note (Addendum)
Given her histro fo breast cancer 6 years ago, gallstones and rapid wt loss I am concerned about metastasis vs chronci cholecystitisand will order chest, abdomen and pelvic CT.

## 2011-10-17 NOTE — Assessment & Plan Note (Addendum)
hgba1c was < 7.0 in January and is 6.5 today.

## 2011-10-19 ENCOUNTER — Telehealth: Payer: Self-pay | Admitting: Internal Medicine

## 2011-10-19 NOTE — Telephone Encounter (Signed)
Pt called checking on referral appointment wanted to know if the will cover her lower back..  She is having a lot of lower back pain

## 2011-10-22 ENCOUNTER — Telehealth: Payer: Self-pay | Admitting: Internal Medicine

## 2011-10-22 NOTE — Telephone Encounter (Signed)
Caller: Phyllis/Other, calling from [facility]; Phone Number: [phone]; Message from caller: States that pt has already had one CT scan that has been authorized, needs another CT scan authorized.  Please call Jamesetta So regarding this.

## 2011-10-23 NOTE — Telephone Encounter (Signed)
Patient went for her CT today, not sure that they will cover her lower back. Please advise patient.

## 2011-10-23 NOTE — Telephone Encounter (Signed)
I called and spoke with Cheyenne Torres at [phone] I advised her that when I called the insurance company they told me I only had to get the CT chest authorized, and that the CT Pelvis and Abdomen didn't require pre-cert.  I advised her that I had to fax in the CPT code and DX code along with office notes to [place] [facility].  She stated that on the website it did show that the CT Abd. And Pelvis did need authorization.  She said that she would call them instead of looking at the website to see if it did need a pre-cert.  She said that she will call me back if it did, if I don't hear from here then I don't have to get a pre-cert.

## 2011-10-24 ENCOUNTER — Ambulatory Visit: Payer: Self-pay | Admitting: Internal Medicine

## 2011-10-24 NOTE — Telephone Encounter (Signed)
I tried calling patient but her number seemed like it was disconnected.  I will try again.

## 2011-10-24 NOTE — Telephone Encounter (Signed)
Patient walked in the office and stated she was going to get the CT today and would talk to the tech about the scan.

## 2011-10-26 ENCOUNTER — Telehealth: Payer: Self-pay | Admitting: Internal Medicine

## 2011-10-26 ENCOUNTER — Other Ambulatory Visit: Payer: Self-pay | Admitting: Internal Medicine

## 2011-10-26 DIAGNOSIS — K6289 Other specified diseases of anus and rectum: Secondary | ICD-10-CM

## 2011-10-26 DIAGNOSIS — R634 Abnormal weight loss: Secondary | ICD-10-CM

## 2011-10-26 NOTE — Telephone Encounter (Signed)
Her ct detected a possible mass in the rectal wall.  I am referreing her to Dr Lemar Livings Evette Cristal [facility] ) for urgent colonoscopy.

## 2011-10-26 NOTE — Telephone Encounter (Signed)
Patient notified of message, I have requested the colonoscopy from [facility].

## 2011-10-26 NOTE — Assessment & Plan Note (Signed)
CT concerning for rectal mass.  Referral to DR. Sankar made.

## 2011-10-26 NOTE — Telephone Encounter (Signed)
See prior  message .  I have made her an appt for Tuesday July 2  With Dr Evette Cristal.,  Arrive at 8:15  Bring DL., insurance card and list of meds.  We need to get her last colonoscopy report from [facility] with the path reports ASAP so Dr Evette Cristal can reivew it .

## 2011-10-31 ENCOUNTER — Ambulatory Visit: Payer: Self-pay | Admitting: General Surgery

## 2011-11-16 ENCOUNTER — Encounter: Payer: Self-pay | Admitting: Internal Medicine

## 2011-11-16 ENCOUNTER — Ambulatory Visit (INDEPENDENT_AMBULATORY_CARE_PROVIDER_SITE_OTHER): Payer: Medicare Other | Admitting: Internal Medicine

## 2011-11-16 VITALS — BP 112/74 | HR 73 | Temp 97.6°F | Wt 114.0 lb

## 2011-11-16 DIAGNOSIS — F320 Major depressive disorder, single episode, mild: Secondary | ICD-10-CM

## 2011-11-16 DIAGNOSIS — C801 Malignant (primary) neoplasm, unspecified: Secondary | ICD-10-CM

## 2011-11-16 DIAGNOSIS — R634 Abnormal weight loss: Secondary | ICD-10-CM

## 2011-11-16 NOTE — Progress Notes (Addendum)
Patient ID: Cheyenne Torres, female   DOB: 1931/07/19, 78 y.o.   MRN: [id number] Patient Active Problem List  Diagnosis  . Osteoporosis  . Hypertension  . Hyperlipidemia  . Screening for colon cancer  . breast cancer  . Depression, major, single episode, mild  . Nephrolithiasis  . Gallstones without obstruction of gallbladder  . Weight loss, unintentional  . Diabetes mellitus type 2, diet-controlled  . Vitamin d deficiency    Subjective:  CC:   Chief Complaint  Patient presents with  . Follow-up    1 month F/U    HPI:   Cheyenne Torres a 78 y.o. female who presents for follow up on abnormal CT of abdomen and pelvis, which was done because of a 14 pound weight loss since November 2012 in the setting of a remote history of breast cancer and colonic polyps by colonoscopy in 2008 at done at [facility].. CT scan suggested a possible malignancy in the rectum due to rectal wall thickening. She was referred to Dr. Marlyce Huge for evaluation with colonoscopy. She has met with him and her her fecal occult blood test was positive. She is scheduled for colonoscopy on Wednesday. She is understandably anxious but her husband is even more anxious and she has resorted to withholding information from him since he reacts so  Dramatically. She has actually gained 2 pounds since her last visit which is encouraging. She is making a concerted effort to supplement her meals with Ensure and boost other she continues to have anorexia. She has had no change in her bowel movements, no hot flashes no pain and no obvious blood in her stools.   Past Medical History  Diagnosis Date  . Heart murmur   . Diverticulitis of colon Oct 2007    hosptialized at [facility], no surgery  . Cardiac arrhythmia   . Screening for colon cancer     last colonoscopy 2008: 2 polyps found, repeat due 2012  . Hyperlipidemia   . breast cancer 1989    s/p left mastectomy/chemo/tamoxifen x 6 yrs  . Osteoporosis 2005    Past Surgical History    Procedure Date  . Appendectomy 1950  . Abdominal hysterectomy 1979  . Cataract extraction, bilateral 2012  . Eye surgery   . Breast surgery 1989    mastectomy,         The following portions of the patient's history were reviewed and updated as appropriate: Allergies, current medications, and problem list.    Review of Systems:   Review of Systems  Constitutional: Negative for fever and weight loss.  Eyes: Negative.   Respiratory: Negative for cough, hemoptysis and shortness of breath.   Cardiovascular: Negative for chest pain and orthopnea.  Gastrointestinal: Negative for heartburn, abdominal pain, constipation and blood in stool.  Genitourinary: Negative.   Musculoskeletal: Negative.   Skin: Negative.   Neurological: Positive for headaches.  Endo/Heme/Allergies: Negative.   Psychiatric/Behavioral: Positive for depression. The patient has insomnia.         History   Social History  . Marital Status: Married    Spouse Name: N/A    Number of Children: N/A  . Years of Education: N/A   Occupational History  . Not on file.   Social History Main Topics  . Smoking status: Never Smoker   . Smokeless tobacco: Never Used  . Alcohol Use: No  . Drug Use: No  . Sexually Active: Not on file   Other Topics Concern  . Not on file  Social History Narrative   Daily Caffeine Use:  NONERegular Exercise -  NO    Objective:  BP 112/74  Pulse 73  Temp 97.6 F (36.4 C) (Oral)  Wt 114 lb (51.71 kg)  SpO2 97%  General appearance: alert, cooperative and appears stated age Ears: normal TM's and external ear canals both ears Throat: lips, mucosa, and tongue normal; teeth and gums normal Neck: no adenopathy, no carotid bruit, supple, symmetrical, trachea midline and thyroid not enlarged, symmetric, no tenderness/mass/nodules Back: symmetric, no curvature. ROM normal. No CVA tenderness. Lungs: clear to auscultation bilaterally Heart: regular rate and rhythm, S1, S2  normal, no murmur, click, rub or gallop Abdomen: soft, non-tender; bowel sounds normal; no masses,  no organomegaly Pulses: 2+ and symmetric Skin: Skin color, texture, turgor normal. No rashes or lesions Lymph nodes: Cervical, supraclavicular, and axillary nodes normal.  Assessment and Plan:  Weight loss, unintentional With unintentional weight loss of 12 pounds since November, mild anemia positive FOBT and a CT concerning for rectal malignancy . She does have a history of polyps by her last colonoscopy in 2008 she is scheduled to have a colonoscopy this week with Dr. Evette Cristal .  Depression, major, single episode, mild Currently managed with SSRI of. Symptoms are stable her appetite is not improving but she has gained some weight. Continue current therapy.  breast cancer Remote diagnosed in 1989. Status post left mastectomy chemotherapy and tamoxifen x6 years. She is up-to-date on annual mammograms on the right side   Updated Medication List Outpatient Encounter Prescriptions as of 11/16/2011  Medication Sig Dispense Refill  . acetaminophen (TYLENOL) 325 MG tablet Take 325 mg by mouth as needed.       Marland Kitchen alendronate (FOSAMAX) 70 MG tablet Take 1 tablet (70 mg total) by mouth every 7 (seven) days. Take with a full glass of water on an empty stomach.  4 tablet  6  . amLODipine (NORVASC) 5 MG tablet Take 1 tablet (5 mg total) by mouth daily.  90 tablet  3  . aspirin 81 MG tablet Take 81 mg by mouth daily.        . benzonatate (TESSALON) 200 MG capsule TAKE ONE CAPSULE BY MOUTH THREE TIMES DAILY AS NEEDED FOR COUGH  60 capsule  1  . calcium carbonate (OS-CAL) 600 MG TABS Take 600 mg by mouth 2 (two) times daily with a meal.        . cholecalciferol (VITAMIN D) 1000 UNITS tablet Take 1,000 Units by mouth daily.        . citalopram (CELEXA) 20 MG tablet Take 1 tablet (20 mg total) by mouth daily.  90 tablet  3  . docusate sodium (COLACE) 100 MG capsule Take 100 mg by mouth daily.        .  enalapril (VASOTEC) 20 MG tablet Take 1 tablet (20 mg total) by mouth daily.  30 tablet  6  . eszopiclone (LUNESTA) 2 MG TABS Take 1 tablet (2 mg total) by mouth at bedtime. Take immediately before bedtime  90 tablet  2  . ibuprofen (ADVIL,MOTRIN) 600 MG tablet Take 600 mg by mouth 3 (three) times daily as needed.        Marland Kitchen ketorolac (ACULAR) 0.4 % SOLN       . NON FORMULARY Fiber cap one daily       . nystatin (MYCOSTATIN) ointment Apply 1 application topically as needed.        Marland Kitchen omeprazole (PRILOSEC) 20 MG capsule Take 20 mg  by mouth daily.        . pravastatin (PRAVACHOL) 40 MG tablet TAKE ONE TABLET BY MOUTH EVERY DAY AT BEDTIME  90 tablet  3  . prednisoLONE acetate (PRED FORTE) 1 % ophthalmic suspension       . traMADol (ULTRAM) 50 MG tablet Take 50 mg by mouth every 6 (six) hours as needed.      . triamcinolone cream (KENALOG) 0.1 % Apply 1 application topically 2 (two) times daily. As needed for itching  30 g  3  . ZYMAXID 0.5 % SOLN       . ergocalciferol (DRISDOL) 50000 UNITS capsule Take 1 capsule (50,000 Units total) by mouth once a week.  4 capsule  0     Orders Placed This Encounter  Procedures  . HM PAP SMEAR    No Follow-up on file.

## 2011-11-18 ENCOUNTER — Encounter: Payer: Self-pay | Admitting: Internal Medicine

## 2011-11-18 NOTE — Assessment & Plan Note (Addendum)
With unintentional weight loss of 12 pounds since November, mild anemia positive FOBT and a CT concerning for rectal malignancy . She does have a history of polyps by her last colonoscopy in 2008 she is scheduled to have a colonoscopy this week with Dr. Evette Cristal .

## 2011-11-18 NOTE — Assessment & Plan Note (Signed)
Currently managed with SSRI of. Symptoms are stable her appetite is not improving but she has gained some weight. Continue current therapy.

## 2011-11-18 NOTE — Assessment & Plan Note (Signed)
Remote diagnosed in 1989. Status post left mastectomy chemotherapy and tamoxifen x6 years. She is up-to-date on annual mammograms on the right side

## 2011-11-21 ENCOUNTER — Ambulatory Visit: Payer: Self-pay | Admitting: General Surgery

## 2011-11-22 LAB — PATHOLOGY REPORT

## 2011-12-04 ENCOUNTER — Encounter: Payer: Self-pay | Admitting: Internal Medicine

## 2012-01-24 ENCOUNTER — Ambulatory Visit (INDEPENDENT_AMBULATORY_CARE_PROVIDER_SITE_OTHER): Payer: Medicare Other | Admitting: Internal Medicine

## 2012-01-24 ENCOUNTER — Encounter: Payer: Self-pay | Admitting: Internal Medicine

## 2012-01-24 VITALS — BP 104/68 | HR 64 | Temp 97.6°F | Resp 14 | Wt 118.5 lb

## 2012-01-24 DIAGNOSIS — L900 Lichen sclerosus et atrophicus: Secondary | ICD-10-CM

## 2012-01-24 DIAGNOSIS — R300 Dysuria: Secondary | ICD-10-CM

## 2012-01-24 DIAGNOSIS — L940 Localized scleroderma [morphea]: Secondary | ICD-10-CM

## 2012-01-24 DIAGNOSIS — R634 Abnormal weight loss: Secondary | ICD-10-CM

## 2012-01-24 LAB — POCT URINALYSIS DIPSTICK
Bilirubin, UA: NEGATIVE
Glucose, UA: NEGATIVE
Ketones, UA: NEGATIVE
Leukocytes, UA: NEGATIVE
Nitrite, UA: NEGATIVE
pH, UA: 7

## 2012-01-24 MED ORDER — CLOBETASOL PROPIONATE 0.05 % EX OINT: TOPICAL_OINTMENT | Freq: Two times a day (BID) | CUTANEOUS | Status: DC

## 2012-01-24 MED ORDER — DICYCLOMINE HCL 10 MG PO CAPS: 10.0000 mg | ORAL_CAPSULE | Freq: Every day | ORAL | Status: DC

## 2012-01-24 NOTE — Patient Instructions (Signed)
Check to see if the health dept or a local pharmacy can give you your TdaP for less than $110.00; that's what it will cost here. It is $40 to administer the flu shot and $20 for the actual vaccine  I will send a prescirpiton for the abdominal cramping and a topical sterid to your pharmacy.    Lichen Sclerosus Lichen sclerosus is a skin problem. It can happen on any part of the body, but it commonly involves the anal or genital areas. Lichen sclerosus is not an infection or a fungus. Girls and women are more commonly affected than boys and men. CAUSES The cause is not known. It could be the result of an overactive immune system or a lack of certain hormones. Lichen sclerosus is not passed from one person to another (not contagious). SYMPTOMS Your skin may have:  Thin, wrinkled, white areas.   Thickened white areas.   Red and swollen patches.   Tears or cracks.   Bruising.   Blood blisters.   Severe itching.  You may also have pain, itching, or burning with urination. Constipation is also common in people with lichen sclerosus. DIAGNOSIS Your caregiver will do a physical exam. Sometimes, a tissue sample (biopsy) may be sent for testing. TREATMENT Treatment may involve putting a thin layer of medicated cream (topical steroid) over the areas with lichen sclerosus. Use the cream only as directed by your caregiver.   HOME CARE INSTRUCTIONS  Only take over-the-counter or prescription medicines as directed by your caregiver.   Keep the vaginal area as clean and dry as possible.  SEEK MEDICAL CARE IF: You develop increasing pain, swelling, or redness. Document Released: 09/06/2010 Document Revised: 04/05/2011 Document Reviewed: 09/06/2010 [facility] Patient Information 2012 [place], [place].

## 2012-01-24 NOTE — Progress Notes (Signed)
Patient ID: Cheyenne Torres, female   DOB: 1932/03/16, 78 y.o.   MRN: [id number]  Patient Active Problem List  Diagnosis  . Osteoporosis  . Hypertension  . Hyperlipidemia  . Screening for colon cancer  . breast cancer  . Depression, major, single episode, mild  . Nephrolithiasis  . Gallstones without obstruction of gallbladder  . Weight loss, unintentional  . Diabetes mellitus type 2, diet-controlled  . Vitamin d deficiency  . Lichen sclerosus et atrophicus    Subjective:  CC:   Chief Complaint  Patient presents with  . Urinary Burning    HPI:   Cheyenne Torres a 78 y.o. female who presents Followup on recent evaluation for weight loss. She had a CT of the abdomen and pelvis was which suggested rectal wall thickening. She was sent for diagnostic  colonoscopy and Dr. Evette Cristal found a small polyp and diverticulosis only. She has been having recurrent lower abdominal cramping since her colonoscopy which was July 24. The cramping is mild brought daily and is relieved by stooling. She has not seen any blood in her stools. she has a new complaint of vaginal itching and burning which has been going on for over a week. She does not have any vaginal discharge. She is not sexually active. She has no history diabetes but she is postmenopausal She has gained 4 lbs since last visit by adding daily Ensure to diet.   She is worried about her husband who has continually refused medications for hyperlipidemia and hypertension. His brother recently dropped dead of a heart attack.    Past Medical History  Diagnosis Date  . Heart murmur   . Diverticulitis of colon Oct 2007    hosptialized at [facility], no surgery  . Cardiac arrhythmia   . Screening for colon cancer     last colonoscopy 2008: 2 polyps found, repeat due 2012  . Hyperlipidemia   . breast cancer 1989    s/p left mastectomy/chemo/tamoxifen x 6 yrs  . Osteoporosis 2005    Past Surgical History  Procedure Date  . Appendectomy 1950  .  Abdominal hysterectomy 1979  . Cataract extraction, bilateral 2012  . Eye surgery   . Breast surgery 1989    mastectomy,         The following portions of the patient's history were reviewed and updated as appropriate: Allergies, current medications, and problem list.    Review of Systems:   12 Pt  review of systems was negative except those addressed in the HPI,     History   Social History  . Marital Status: Married    Spouse Name: N/A    Number of Children: N/A  . Years of Education: N/A   Occupational History  . Not on file.   Social History Main Topics  . Smoking status: Never Smoker   . Smokeless tobacco: Never Used  . Alcohol Use: No  . Drug Use: No  . Sexually Active: Not on file   Other Topics Concern  . Not on file   Social History Narrative   Daily Caffeine Use:  NONERegular Exercise -  NO    Objective:  BP 104/68  Pulse 64  Temp 97.6 F (36.4 C) (Oral)  Resp 14  Wt 118 lb 8 oz (53.751 kg)  SpO2 96%  General appearance: alert, cooperative and appears stated age Ears: normal TM's and external ear canals both ears Throat: lips, mucosa, and tongue normal; teeth and gums normal Neck: no adenopathy, no carotid  bruit, supple, symmetrical, trachea midline and thyroid not enlarged, symmetric, no tenderness/mass/nodules Back: symmetric, no curvature. ROM normal. No CVA tenderness. Lungs: clear to auscultation bilaterally Heart: regular rate and rhythm, S1, S2 normal, no murmur, click, rub or gallop Abdomen: soft, non-tender; bowel sounds normal; no masses,  no organomegaly Pulses: 2+ and symmetric GYN: vaginal atrophy, lichenification of vulva.  Skin: Skin color, texture, turgor normal. No rashes or lesions Lymph nodes: Cervical, supraclavicular, and axillary nodes normal.  Assessment and Plan:  Lichen sclerosus et atrophicus Suggested by pelvic exam done today for persistent symptoms of itching and burning. Vaginal vault  was without  discharge but did have the  characteristics of atrophy and lichenification. Trial of clobetasol ointment twice daily as needed.  Weight loss, unintentional She has gained 4 pounds since her weight loss was addressed with a comprehensive evaluation including abdominal and pelvic CT, followed by colonoscopy. Her abdominal cramping this far out from her colonoscopy may be due to irritable bowel syndrome. I suggested a trial of  Bentyl to take once daily at bedtime since her symptoms are only occurring in the morning prior to her bowel movement. Updated Medication List Outpatient Encounter Prescriptions as of 01/24/2012  Medication Sig Dispense Refill  . acetaminophen (TYLENOL) 325 MG tablet Take 325 mg by mouth as needed.       Marland Kitchen alendronate (FOSAMAX) 70 MG tablet Take 1 tablet (70 mg total) by mouth every 7 (seven) days. Take with a full glass of water on an empty stomach.  4 tablet  6  . amLODipine (NORVASC) 5 MG tablet Take 1 tablet (5 mg total) by mouth daily.  90 tablet  3  . aspirin 81 MG tablet Take 81 mg by mouth daily.        . benzonatate (TESSALON) 200 MG capsule TAKE ONE CAPSULE BY MOUTH THREE TIMES DAILY AS NEEDED FOR COUGH  60 capsule  1  . calcium carbonate (OS-CAL) 600 MG TABS Take 600 mg by mouth 2 (two) times daily with a meal.        . cholecalciferol (VITAMIN D) 1000 UNITS tablet Take 1,000 Units by mouth daily.        . citalopram (CELEXA) 20 MG tablet Take 1 tablet (20 mg total) by mouth daily.  90 tablet  3  . docusate sodium (COLACE) 100 MG capsule Take 100 mg by mouth daily.        . enalapril (VASOTEC) 20 MG tablet Take 1 tablet (20 mg total) by mouth daily.  30 tablet  6  . ergocalciferol (DRISDOL) 50000 UNITS capsule Take 1 capsule (50,000 Units total) by mouth once a week.  4 capsule  0  . eszopiclone (LUNESTA) 2 MG TABS Take 1 tablet (2 mg total) by mouth at bedtime. Take immediately before bedtime  90 tablet  2  . ibuprofen (ADVIL,MOTRIN) 600 MG tablet Take 600 mg by  mouth 3 (three) times daily as needed.        Marland Kitchen ketorolac (ACULAR) 0.4 % SOLN       . NON FORMULARY Fiber cap one daily       . nystatin (MYCOSTATIN) ointment Apply 1 application topically as needed.        Marland Kitchen omeprazole (PRILOSEC) 20 MG capsule Take 20 mg by mouth daily.        . pravastatin (PRAVACHOL) 40 MG tablet TAKE ONE TABLET BY MOUTH EVERY DAY AT BEDTIME  90 tablet  3  . prednisoLONE acetate (PRED FORTE) 1 %  ophthalmic suspension       . traMADol (ULTRAM) 50 MG tablet Take 50 mg by mouth every 6 (six) hours as needed.      . triamcinolone cream (KENALOG) 0.1 % Apply 1 application topically 2 (two) times daily. As needed for itching  30 g  3  . ZYMAXID 0.5 % SOLN       . clobetasol ointment (TEMOVATE) 0.05 % Apply topically 2 (two) times daily.  30 g  0  . dicyclomine (BENTYL) 10 MG capsule Take 1 capsule (10 mg total) by mouth daily before breakfast.  30 capsule  3     Orders Placed This Encounter  Procedures  . POCT urinalysis dipstick  . HM COLONOSCOPY    No Follow-up on file.

## 2012-01-27 DIAGNOSIS — L900 Lichen sclerosus et atrophicus: Secondary | ICD-10-CM | POA: Insufficient documentation

## 2012-01-27 NOTE — Assessment & Plan Note (Signed)
She has gained 4 pounds since her weight loss was addressed with a comprehensive evaluation including abdominal and pelvic CT, followed by colonoscopy.

## 2012-01-27 NOTE — Assessment & Plan Note (Signed)
Suggested by pelvic exam done today for persistent symptoms of itching and burning. Vaginal vault  was without discharge but did have the  characteristics of atrophy and lichenification. Trial of clobetasol ointment twice daily as needed.

## 2012-04-05 IMAGING — CT CT STONE STUDY
1 of 2 series · 15 of 32 positions shown, 19 images · non-contrast
Comparison: none

REASON FOR EXAM: right flank pain
COMMENTS:

[Series 2: stone · axial · 0.60mm/px · z∈[-436,-70]mm · 15 of 139 slices shown, 19 images]
[im 11/139  soft-tissue]
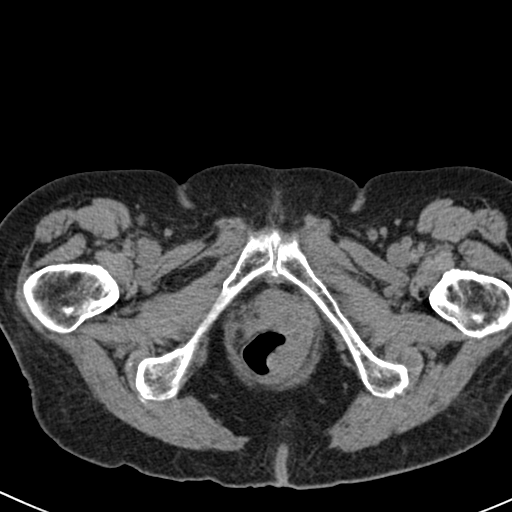
[im 11/139  bone]
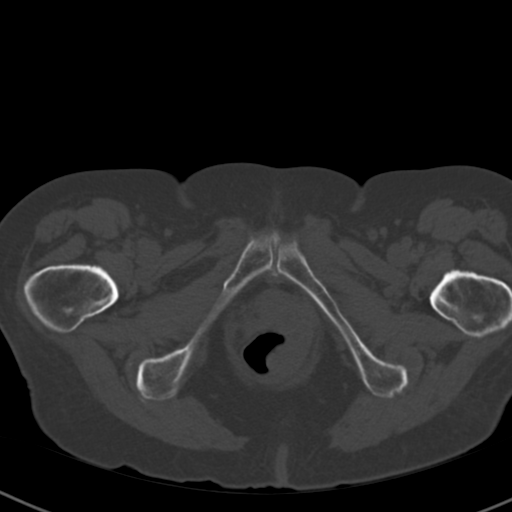
[im 21/139  soft-tissue]
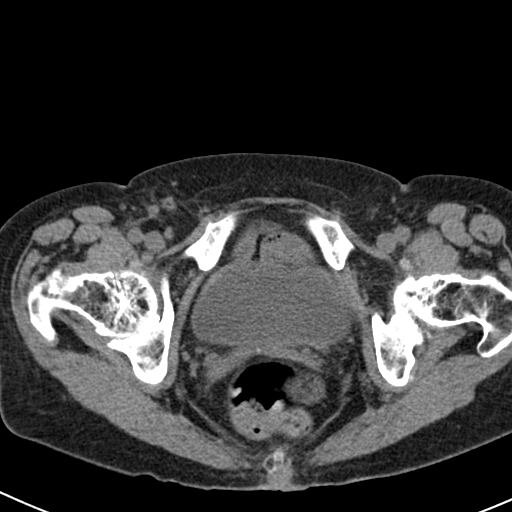
[im 31/139  soft-tissue]
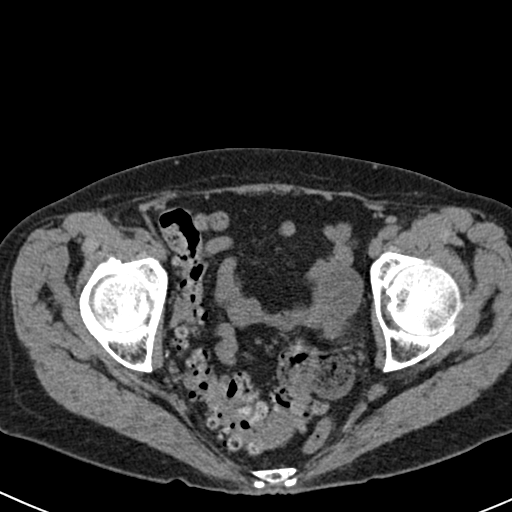
[im 41/139  soft-tissue]
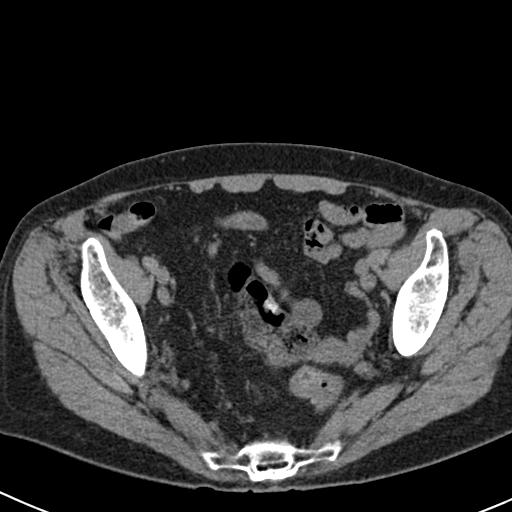
[im 52/139  soft-tissue]
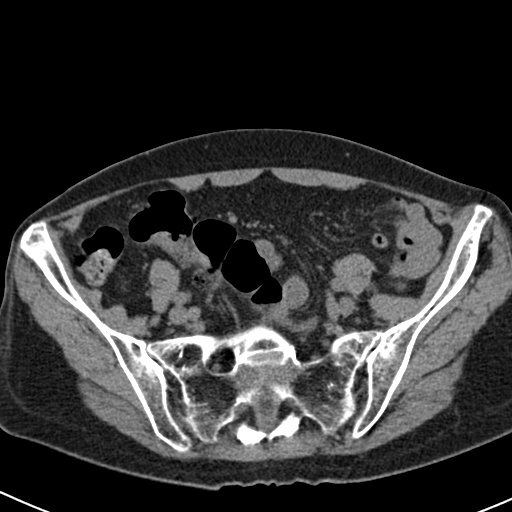
[im 62/139  soft-tissue]
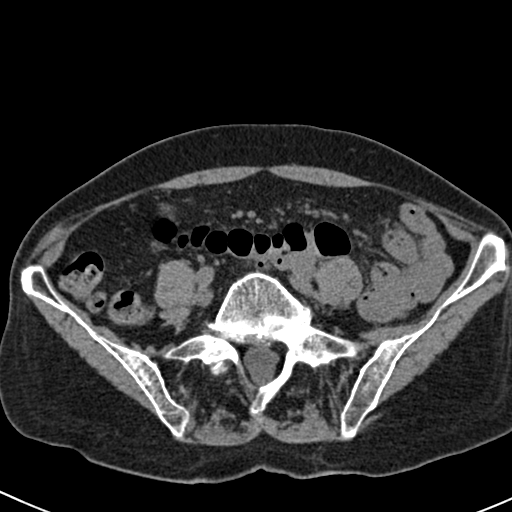
[im 72/139  soft-tissue]
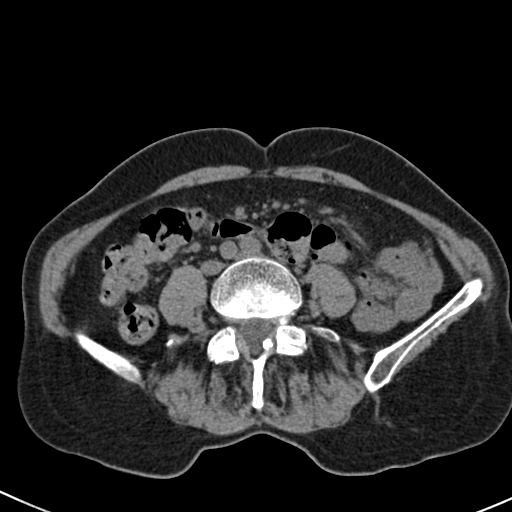
[im 82/139  soft-tissue]
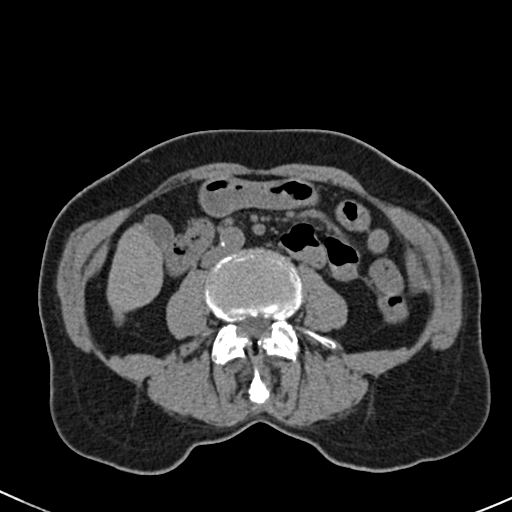
[im 93/139  soft-tissue]
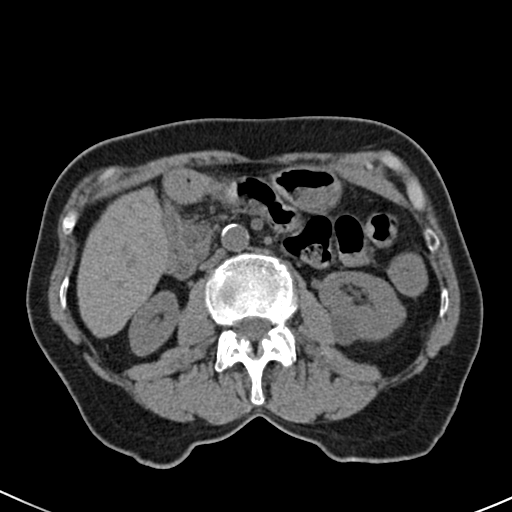
[im 93/139  bone]
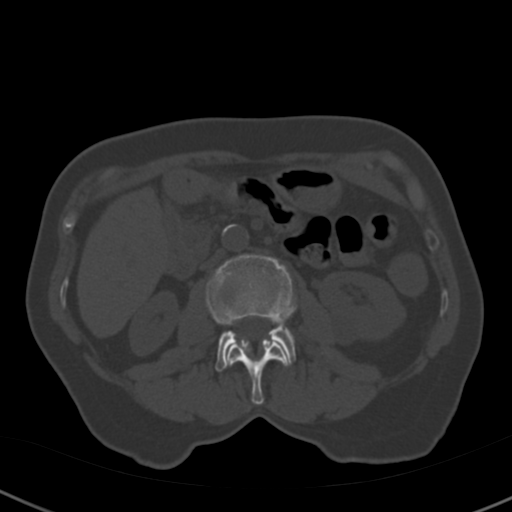
[im 103/139  soft-tissue]
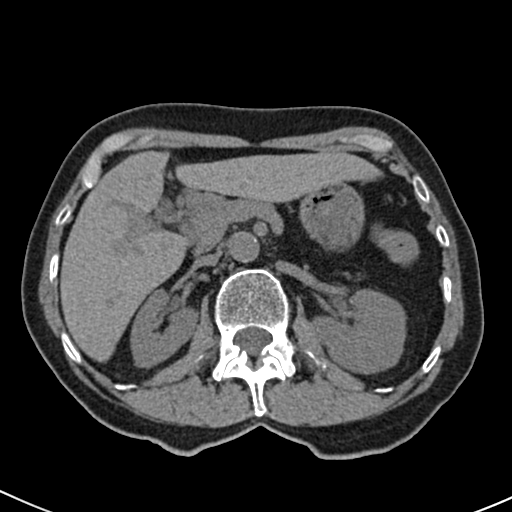
[im 113/139  soft-tissue]
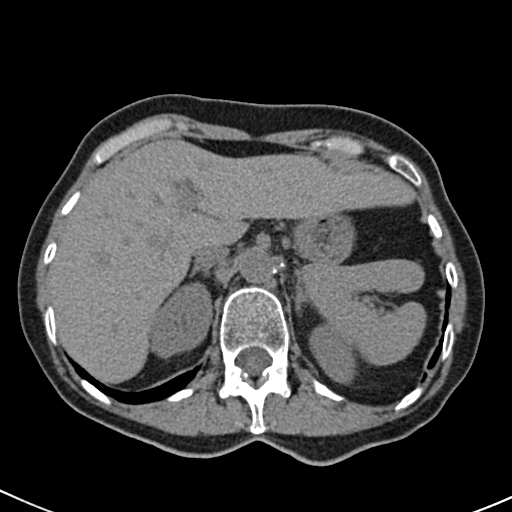
[im 118/139  lung]
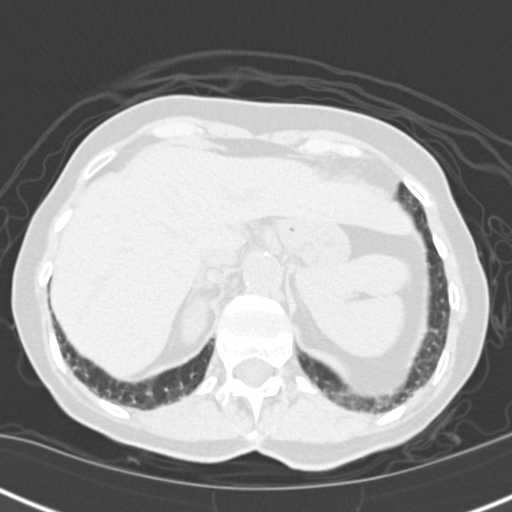
[im 123/139  soft-tissue]
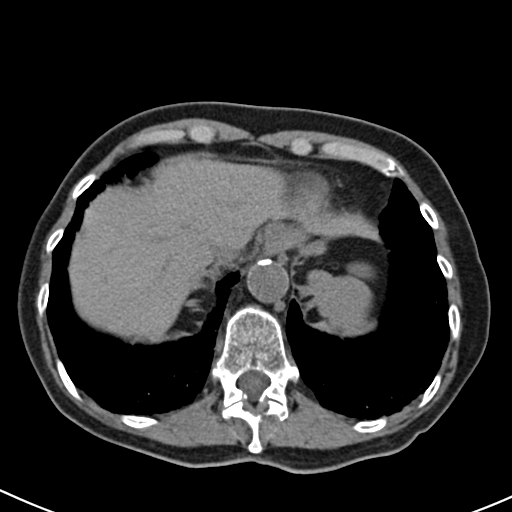
[im 123/139  lung]
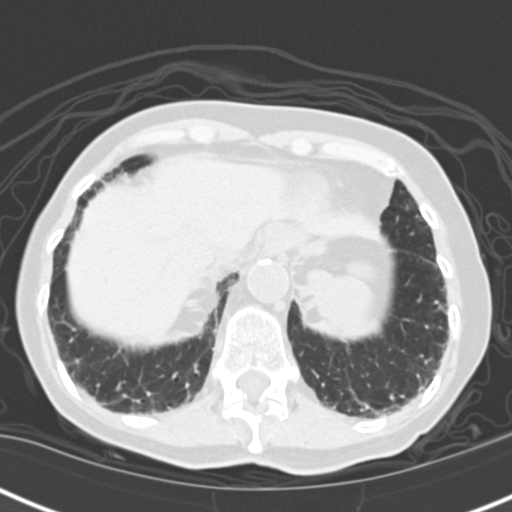
[im 128/139  lung]
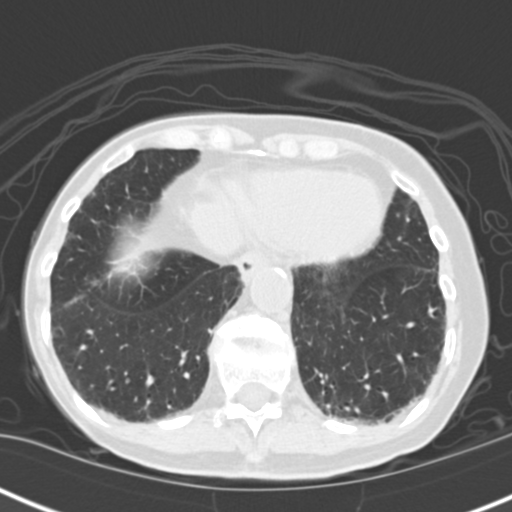
[im 133/139  soft-tissue]
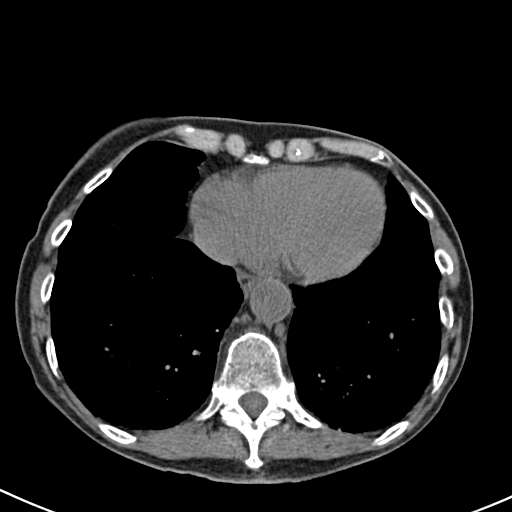
[im 133/139  lung]
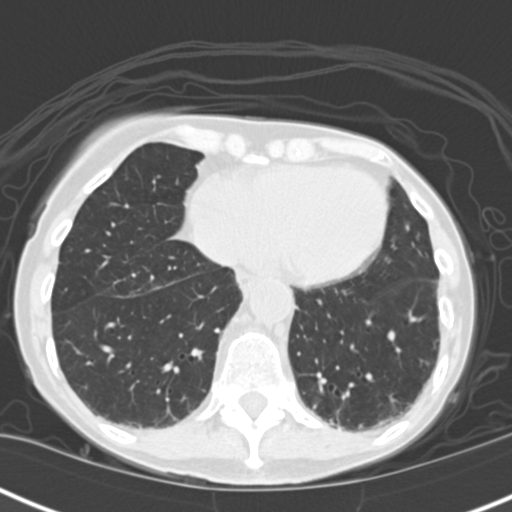

[15 of 32 positions shown; findings below may reference images not displayed]

PROCEDURE:     CT  - CT ABDOMEN /PELVIS WO (STONE)  - June 09, 2011 [DATE]

RESULT:     Axial noncontrast CT scanning was performed through the abdomen
and pelvis with reconstructions at 3 mm intervals and slice thicknesses.
Review of multiplanar reconstructed images was performed separately on the
VIA monitor.

The patient has undergone previous left mastectomy. The lung bases exhibit
mild emphysematous changes. The kidneys exhibit no evidence of obstruction.
I do not see calcified stones. On the left there is a cortically based
hypodensity exophytic from the the lower pole most compatible with a cyst.
Along the expected course of the ureters I do not see abnormal
calcifications. The partially distended urinary bladder is normal in
appearance.

Within the pelvis there are exuberant sigmoid diverticula. I do not see
objective evidence of acute diverticulitis. There is no evidence of an
abscess or free fluid or free air. The remainder of the colon exhibits no
significant abnormality. I see no small bowel abnormality.

The liver, gallbladder, spleen, and adrenal glandsThe pancreas does appear
largely fatty replaced. The pancreatic head is poorly defined . The
gallbladder is somewhat inferiorly positioned and contains radiodense
material most compatible with stones. There are calcifications at the
origins of the superior mesenteric artery and renal arteries. The caliber of
the abdominal aorta is normal. I see no evidence of ascites. The lumbar
vertebral bodies are preserved in height.
IMPRESSION: 1. The study is limited without IV and oral contrast. There is sigmoid
diverticulosis without objective evidence of acute diverticulitis. I see no
evidence of colitis or enteritis.
2. I do not see evidence of urinary tract stones nor obstruction.
3. There do appear to be coarse calcified gallstones.
4. The pancreatic is poorly defined and the body and tail may be atrophic.
Correlation with any clinical or laboratory findings that may reflect focal
pancreatitis is needed.

## 2012-04-16 ENCOUNTER — Ambulatory Visit (INDEPENDENT_AMBULATORY_CARE_PROVIDER_SITE_OTHER): Payer: Medicare Other | Admitting: Internal Medicine

## 2012-04-16 ENCOUNTER — Encounter: Payer: Self-pay | Admitting: Internal Medicine

## 2012-04-16 VITALS — BP 118/52 | HR 73 | Temp 97.4°F | Resp 12 | Ht 67.5 in | Wt 119.0 lb

## 2012-04-16 DIAGNOSIS — Z1331 Encounter for screening for depression: Secondary | ICD-10-CM

## 2012-04-16 DIAGNOSIS — E559 Vitamin D deficiency, unspecified: Secondary | ICD-10-CM

## 2012-04-16 DIAGNOSIS — R5383 Other fatigue: Secondary | ICD-10-CM

## 2012-04-16 DIAGNOSIS — I10 Essential (primary) hypertension: Secondary | ICD-10-CM

## 2012-04-16 DIAGNOSIS — F329 Major depressive disorder, single episode, unspecified: Secondary | ICD-10-CM | POA: Insufficient documentation

## 2012-04-16 DIAGNOSIS — F320 Major depressive disorder, single episode, mild: Secondary | ICD-10-CM

## 2012-04-16 DIAGNOSIS — R110 Nausea: Secondary | ICD-10-CM

## 2012-04-16 DIAGNOSIS — R5381 Other malaise: Secondary | ICD-10-CM

## 2012-04-16 DIAGNOSIS — E119 Type 2 diabetes mellitus without complications: Secondary | ICD-10-CM

## 2012-04-16 DIAGNOSIS — F32A Depression, unspecified: Secondary | ICD-10-CM | POA: Insufficient documentation

## 2012-04-16 DIAGNOSIS — R634 Abnormal weight loss: Secondary | ICD-10-CM

## 2012-04-16 LAB — POCT URINALYSIS DIPSTICK
Blood, UA: NEGATIVE
Glucose, UA: NEGATIVE
Ketones, UA: NEGATIVE
Protein, UA: NEGATIVE
Spec Grav, UA: 1.015

## 2012-04-16 MED ORDER — PRAVASTATIN SODIUM 40 MG PO TABS: 40.0000 mg | ORAL_TABLET | Freq: Every day | ORAL | Status: DC

## 2012-04-16 MED ORDER — FLUTICASONE PROPIONATE 50 MCG/ACT NA SUSP: 2.0000 | Freq: Every day | NASAL | Status: DC

## 2012-04-16 MED ORDER — ALENDRONATE SODIUM 70 MG PO TABS: 70.0000 mg | ORAL_TABLET | ORAL | Status: DC

## 2012-04-16 MED ORDER — PROMETHAZINE HCL 12.5 MG PO TABS: 12.5000 mg | ORAL_TABLET | Freq: Three times a day (TID) | ORAL | Status: DC | PRN

## 2012-04-16 NOTE — Patient Instructions (Addendum)
I am prescribing promethazine for your nausea to use as needed  I am prescribing flonase nasal spray for your congestion and dizziness  2 squirts on each side daily   If your urnie suggests and infection I will let you know and will call in an antibiotic

## 2012-04-16 NOTE — Progress Notes (Signed)
Patient ID: Cheyenne Torres, female   DOB: 1931-12-11, 78 y.o.   MRN: [id number]  Patient Active Problem List  Diagnosis  . Osteoporosis  . Hypertension  . Hyperlipidemia  . Screening for colon cancer  . breast cancer  . Depression, major, single episode, mild  . Nephrolithiasis  . Gallstones without obstruction of gallbladder  . Weight loss, unintentional  . Diabetes mellitus type 2, diet-controlled  . Vitamin D deficiency  . Lichen sclerosus et atrophicus  . Nausea in adult    Subjective:  CC:   Chief Complaint  Patient presents with  . Follow-up    HPI:   Cheyenne Torres a 78 y.o. female who presents 6 month follow up on chronic conditions including depression, diet controlled diabetes and hypertension.  She reports feeling poorly today secondary to sudden onset of nausea and chills which started this morning after waking up .  She denies flank pain, dysuria and myalgias.  She ae canned pears to help the nausea . No headaches but occasional left ear pain .    Past Medical History  Diagnosis Date  . Heart murmur   . Diverticulitis of colon Oct 2007    hosptialized at [facility], no surgery  . Cardiac arrhythmia   . Screening for colon cancer     last colonoscopy 2008: 2 polyps found, repeat due 2012  . Hyperlipidemia   . breast cancer 1989    s/p left mastectomy/chemo/tamoxifen x 6 yrs  . Osteoporosis 2005  . Depression     Past Surgical History  Procedure Date  . Appendectomy 1950  . Abdominal hysterectomy 1979  . Cataract extraction, bilateral 2012  . Eye surgery   . Breast surgery 1989    mastectomy,         The following portions of the patient's history were reviewed and updated as appropriate: Allergies, current medications, and problem list.    Review of Systems:   Patient denies headache, fevers, unintentional weight loss, skin rash, eye pain, sinus congestion and sinus pain, sore throat, dysphagia,  hemoptysis , cough, dyspnea, wheezing, chest  pain, palpitations, orthopnea, edema, abdominal pain, melena, diarrhea, constipation, flank pain, dysuria, hematuria, urinary  Frequency, nocturia, numbness, tingling, seizures,  Focal weakness, Loss of consciousness,  Tremor, insomnia, depression, anxiety, and suicidal ideation.       History   Social History  . Marital Status: Married    Spouse Name: N/A    Number of Children: N/A  . Years of Education: N/A   Occupational History  . Not on file.   Social History Main Topics  . Smoking status: Never Smoker   . Smokeless tobacco: Never Used  . Alcohol Use: No  . Drug Use: No  . Sexually Active: Not on file   Other Topics Concern  . Not on file   Social History Narrative   Daily Caffeine Use:  NONERegular Exercise -  NO    Objective:  BP 118/52  Pulse 73  Temp 97.4 F (36.3 C) (Oral)  Resp 12  Ht 5' 7.5" (1.715 m)  Wt 119 lb (53.978 kg)  BMI 18.36 kg/m2  SpO2 97%  General appearance: alert, cooperative and appears stated age Ears: right TM fullness without erythema, normal left TM and external ear canals both ears Throat: lips, mucosa, and tongue normal; teeth and gums normal Neck: no adenopathy, no carotid bruit, supple, symmetrical, trachea midline and thyroid not enlarged, symmetric, no tenderness/mass/nodules Back: symmetric, no curvature. ROM normal. No CVA tenderness. Lungs:  clear to auscultation bilaterally Heart: regular rate and rhythm, S1, S2 normal, no murmur, click, rub or gallop Abdomen: soft, non-tender; bowel sounds normal; no masses,  no organomegaly Pulses: 2+ and symmetric Skin: Skin color, texture, turgor normal. No rashes or lesions Lymph nodes: Cervical, supraclavicular, and axillary nodes normal.  Assessment and Plan:  Nausea in adult No signs of UTI or cholecystitis on exam.. symptoms are likely viral and may be due to inner ear.  flonase and phenergan prescribed.  Hypertension Well controlled on current regimen. Renal function stable,  no changes today.  Depression, major, single episode, mild Improved with SSRI.  Weight  Loss stopped and she is feeling better.   Diabetes mellitus type 2, diet-controlled Controlled with diet historically,  a1c was 6.5 in June.   On an aspirin,  Statin and ACE Inhibitor.   Weight loss, unintentional Secondary to depression.  CT of chest abdomen and pelvis was normal.    Updated Medication List Outpatient Encounter Prescriptions as of 04/16/2012  Medication Sig Dispense Refill  . acetaminophen (TYLENOL) 325 MG tablet Take 325 mg by mouth as needed.       Marland Kitchen alendronate (FOSAMAX) 70 MG tablet Take 1 tablet (70 mg total) by mouth every 7 (seven) days. Take with a full glass of water on an empty stomach.  4 tablet  3  . amLODipine (NORVASC) 5 MG tablet Take 1 tablet (5 mg total) by mouth daily.  90 tablet  3  . aspirin 81 MG tablet Take 81 mg by mouth daily.        . calcium carbonate (OS-CAL) 600 MG TABS Take 600 mg by mouth 2 (two) times daily with a meal.        . cholecalciferol (VITAMIN D) 1000 UNITS tablet Take 1,000 Units by mouth daily.        . citalopram (CELEXA) 20 MG tablet Take 1 tablet (20 mg total) by mouth daily.  90 tablet  3  . clobetasol ointment (TEMOVATE) 0.05 % Apply topically 2 (two) times daily.  30 g  0  . docusate sodium (COLACE) 100 MG capsule Take 100 mg by mouth daily.        . enalapril (VASOTEC) 20 MG tablet Take 1 tablet (20 mg total) by mouth daily.  30 tablet  6  . ergocalciferol (DRISDOL) 50000 UNITS capsule Take 1 capsule (50,000 Units total) by mouth once a week.  4 capsule  0  . ketorolac (ACULAR) 0.4 % SOLN       . NON FORMULARY Fiber cap one daily       . nystatin (MYCOSTATIN) ointment Apply 1 application topically as needed.        . pravastatin (PRAVACHOL) 40 MG tablet Take 1 tablet (40 mg total) by mouth daily.  90 tablet  3  . prednisoLONE acetate (PRED FORTE) 1 % ophthalmic suspension       . traMADol (ULTRAM) 50 MG tablet Take 50 mg by mouth  every 6 (six) hours as needed.      Marland Kitchen ZYMAXID 0.5 % SOLN       . [DISCONTINUED] alendronate (FOSAMAX) 70 MG tablet Take 1 tablet (70 mg total) by mouth every 7 (seven) days. Take with a full glass of water on an empty stomach.  4 tablet  6  . [DISCONTINUED] pravastatin (PRAVACHOL) 40 MG tablet TAKE ONE TABLET BY MOUTH EVERY DAY AT BEDTIME  90 tablet  3  . benzonatate (TESSALON) 200 MG capsule TAKE ONE CAPSULE BY  MOUTH THREE TIMES DAILY AS NEEDED FOR COUGH  60 capsule  1  . dicyclomine (BENTYL) 10 MG capsule Take 1 capsule (10 mg total) by mouth daily before breakfast.  30 capsule  3  . eszopiclone (LUNESTA) 2 MG TABS Take 1 tablet (2 mg total) by mouth at bedtime. Take immediately before bedtime  90 tablet  2  . fluticasone (FLONASE) 50 MCG/ACT nasal spray Place 2 sprays into the nose daily.  16 g  6  . ibuprofen (ADVIL,MOTRIN) 600 MG tablet Take 600 mg by mouth 3 (three) times daily as needed.        Marland Kitchen omeprazole (PRILOSEC) 20 MG capsule Take 20 mg by mouth daily.        . promethazine (PHENERGAN) 12.5 MG tablet Take 1 tablet (12.5 mg total) by mouth every 8 (eight) hours as needed for nausea.  20 tablet  0  . triamcinolone cream (KENALOG) 0.1 % Apply 1 application topically 2 (two) times daily. As needed for itching  30 g  3

## 2012-04-17 ENCOUNTER — Telehealth: Payer: Self-pay | Admitting: Internal Medicine

## 2012-04-17 DIAGNOSIS — E119 Type 2 diabetes mellitus without complications: Secondary | ICD-10-CM

## 2012-04-17 DIAGNOSIS — E559 Vitamin D deficiency, unspecified: Secondary | ICD-10-CM

## 2012-04-17 MED ORDER — VITAMIN D (ERGOCALCIFEROL) 1.25 MG (50000 UNIT) PO CAPS: 50000.0000 [IU] | ORAL_CAPSULE | ORAL | Status: DC

## 2012-04-17 NOTE — Telephone Encounter (Signed)
Cheyenne Torres can you add a [id number] to yesterday's lab draw/ thanks

## 2012-04-17 NOTE — Telephone Encounter (Signed)
Spoke to patient adivsed her normal lab

## 2012-04-17 NOTE — Telephone Encounter (Signed)
Labs normal except vit d is very low.  Will send rx for drisdol to take WEEKLY for the winter months

## 2012-04-18 LAB — CULTURE, URINE COMPREHENSIVE
Colony Count: NO GROWTH
Organism ID, Bacteria: NO GROWTH

## 2012-04-20 ENCOUNTER — Encounter: Payer: Self-pay | Admitting: Internal Medicine

## 2012-04-20 DIAGNOSIS — R110 Nausea: Secondary | ICD-10-CM | POA: Insufficient documentation

## 2012-04-20 NOTE — Assessment & Plan Note (Addendum)
No signs of UTI or cholecystitis on exam.. symptoms are likely viral and may be due to inner ear.  flonase and phenergan prescribed.

## 2012-04-20 NOTE — Assessment & Plan Note (Signed)
Improved with SSRI.  Weight  Loss stopped and she is feeling better.

## 2012-04-20 NOTE — Assessment & Plan Note (Addendum)
Controlled with diet historically,  a1c was 6.5 in June.   On an aspirin,  Statin and ACE Inhibitor.

## 2012-04-20 NOTE — Assessment & Plan Note (Signed)
Secondary to depression.  CT of chest abdomen and pelvis was normal.

## 2012-04-20 NOTE — Assessment & Plan Note (Signed)
Well controlled on current regimen. Renal function stable, no changes today. 

## 2012-09-16 ENCOUNTER — Other Ambulatory Visit: Payer: Self-pay | Admitting: Internal Medicine

## 2012-09-16 NOTE — Telephone Encounter (Signed)
Rx sent to pharmacy by escript. Has appt 10/06/12

## 2012-10-06 ENCOUNTER — Encounter: Payer: Self-pay | Admitting: Internal Medicine

## 2012-10-06 ENCOUNTER — Ambulatory Visit (INDEPENDENT_AMBULATORY_CARE_PROVIDER_SITE_OTHER): Payer: Medicare Other | Admitting: Internal Medicine

## 2012-10-06 VITALS — BP 128/58 | HR 77 | Temp 97.8°F | Resp 14 | Wt 118.5 lb

## 2012-10-06 DIAGNOSIS — R296 Repeated falls: Secondary | ICD-10-CM | POA: Insufficient documentation

## 2012-10-06 DIAGNOSIS — Z9181 History of falling: Secondary | ICD-10-CM

## 2012-10-06 DIAGNOSIS — D649 Anemia, unspecified: Secondary | ICD-10-CM

## 2012-10-06 DIAGNOSIS — R634 Abnormal weight loss: Secondary | ICD-10-CM

## 2012-10-06 DIAGNOSIS — Z79899 Other long term (current) drug therapy: Secondary | ICD-10-CM

## 2012-10-06 DIAGNOSIS — Z87828 Personal history of other (healed) physical injury and trauma: Secondary | ICD-10-CM

## 2012-10-06 DIAGNOSIS — S0990XA Unspecified injury of head, initial encounter: Secondary | ICD-10-CM | POA: Insufficient documentation

## 2012-10-06 DIAGNOSIS — R29898 Other symptoms and signs involving the musculoskeletal system: Secondary | ICD-10-CM | POA: Insufficient documentation

## 2012-10-06 DIAGNOSIS — E119 Type 2 diabetes mellitus without complications: Secondary | ICD-10-CM

## 2012-10-06 LAB — COMPREHENSIVE METABOLIC PANEL
AST: 14 U/L (ref 0–37)
Alkaline Phosphatase: 64 U/L (ref 39–117)
BUN: 25 mg/dL — ABNORMAL HIGH (ref 6–23)
Glucose, Bld: 110 mg/dL — ABNORMAL HIGH (ref 70–99)
Total Bilirubin: 0.5 mg/dL (ref 0.3–1.2)

## 2012-10-06 LAB — CBC WITH DIFFERENTIAL/PLATELET
Basophils Relative: 0.6 % (ref 0.0–3.0)
Eosinophils Relative: 1.5 % (ref 0.0–5.0)
HCT: 33.3 % — ABNORMAL LOW (ref 36.0–46.0)
Hemoglobin: 11.2 g/dL — ABNORMAL LOW (ref 12.0–15.0)
Lymphs Abs: 2.4 10*3/uL (ref 0.7–4.0)
MCV: 86.8 fl (ref 78.0–100.0)
Monocytes Relative: 5.6 % (ref 3.0–12.0)
Neutro Abs: 5 10*3/uL (ref 1.4–7.7)
RBC: 3.84 Mil/uL — ABNORMAL LOW (ref 3.87–5.11)
WBC: 8 10*3/uL (ref 4.5–10.5)

## 2012-10-06 LAB — TSH: TSH: 2.47 u[IU]/mL (ref 0.35–5.50)

## 2012-10-06 LAB — CK: Total CK: 28 U/L (ref 7–177)

## 2012-10-06 NOTE — Patient Instructions (Addendum)
Stop the Lunesta  No more couch potato behavior  Trial of alprazolam for early morning insomnia *you can  Take it at 2 am if needed)  Physical therapy  MRI of Brain   Labs today

## 2012-10-06 NOTE — Assessment & Plan Note (Signed)
She has had 3 falls in the last 6 weeks which resulted in blunt trauma to the head. She has a healing ecchymosis over her right eye. MRI of brain ordered to rule out subdural hematoma and space-occupying mass.

## 2012-10-06 NOTE — Assessment & Plan Note (Signed)
Normal DTRs, distal leg strength, and notable muscle atrophy in the hip flexors and extenders. Checking TSH muscle enzymes, CBC, and see metastases. Referral to [place] physical therapy for strength training. If she fails to gain strength with physical therapy she will neurology evaluation occult causes of falls.

## 2012-10-06 NOTE — Assessment & Plan Note (Signed)
Her gait is normal. She has a normal neurologic exam. I believe her falls are due to proximal muscle weakness due to inactivity. Refer to PT.

## 2012-10-06 NOTE — Progress Notes (Signed)
Patient ID: Cheyenne Torres, female   DOB: [birth year], 79 y.o.   MRN: [id number]   Patient Active Problem List   Diagnosis Date Noted  . Recurrent falls 10/06/2012  . Proximal leg weakness 10/06/2012  . Head injury, closed, without LOC 10/06/2012  . Nausea in adult 04/20/2012  . Lichen sclerosus et atrophicus 01/27/2012  . Weight loss, unintentional 10/17/2011  . Diabetes mellitus type 2, diet-controlled 10/17/2011  . Vitamin D deficiency 10/17/2011  . Nephrolithiasis 06/18/2011  . Gallstones without obstruction of gallbladder 06/18/2011  . Depression, major, single episode, mild 04/16/2011  . Screening for colon cancer   . breast cancer   . Osteoporosis 03/07/2011  . Hypertension 03/07/2011  . Hyperlipidemia 03/07/2011    Subjective:  CC:   Chief Complaint  Patient presents with  . Follow-up    6 month follow up, no appetite, weight loss,  . Fall    3 falls last 6 weeks    HPI:   Cheyenne Torres a 79 y.o. female who presents 6 month follow up.  She is accompanied by her son,  Laquonda Welby, who lives in [place] and is retired ([phone]). He is concerned because she has had 3 falls in the last 6 weeks at home.  1) middle of the night fell into the bathtub  4 to5 weeks ago .  Hit her  head iin bathtub 2) making the bed  Tripped on the bedsheets,  Hit the dresser.  Same week  3) fell up the stairs going to the back door from the patio. This occurred while carrying a tray of steaks and. She fell forward,  Struck eye on cornerpost .  Black eye  Last Sunday (1 week ago)  No LOC ,   Not witnessed but husband was in the house,  And found her conscious on all 3 occasions. She denies dizziness. She had a headache for an hour or 2 after the first fall into the bathtub but has not been plagued with headaches. No visual changes. Her son has noted decreased activityand a largely sedentary nature.  And finders her and and  Curled  up in the recliner with a blanket. He is  concerned that she has become weak.  Cataract extraction one year ago bilaterally.,  Some dimming noted of vision  Dry eyes  rx'd     Past Medical History  Diagnosis Date  . Heart murmur   . Diverticulitis of colon Oct 2007    hosptialized at [facility], no surgery  . Cardiac arrhythmia   . Screening for colon cancer     last colonoscopy 2008: 2 polyps found, repeat due 2012  . Hyperlipidemia   . breast cancer 1989    s/p left mastectomy/chemo/tamoxifen x 6 yrs  . Osteoporosis 2005  . Depression     Past Surgical History  Procedure Laterality Date  . Appendectomy  1950  . Abdominal hysterectomy  1979  . Cataract extraction, bilateral  2012  . Eye surgery    . Breast surgery  1989    mastectomy,       The following portions of the patient's history were reviewed and updated as appropriate: Allergies, current medications, and problem list.    Review of Systems:   Patient denies headache, fevers, malaise, unintentional weight loss, skin rash, eye pain, sinus congestion and sinus pain, sore throat, dysphagia,  hemoptysis , cough, dyspnea, wheezing, chest pain, palpitations, orthopnea, edema, abdominal pain, nausea, melena, diarrhea, constipation, flank pain, dysuria, hematuria,  urinary  Frequency, nocturia, numbness, tingling, seizures,  Focal weakness, Loss of consciousness,  Tremor, insomnia, depression, anxiety, and suicidal ideation.     History   Social History  . Marital Status: Married    Spouse Name: N/A    Number of Children: N/A  . Years of Education: N/A   Occupational History  . Not on file.   Social History Main Topics  . Smoking status: Never Smoker   . Smokeless tobacco: Never Used  . Alcohol Use: No  . Drug Use: No  . Sexually Active: Not on file   Other Topics Concern  . Not on file   Social History Narrative   Daily Caffeine Use:  NONE   Regular Exercise -  NO          Objective:  BP 128/58  Pulse 77  Temp(Src) 97.8 F (36.6 C)  (Oral)  Resp 14  Wt 118 lb 8 oz (53.751 kg)  BMI 18.28 kg/m2  SpO2 97%  General appearance: alert, cooperative and appears stated age Ears: normal TM's and external ear canals both ears Throat: lips, mucosa, and tongue normal; teeth and gums normal Neck: no adenopathy, no carotid bruit, supple, symmetrical, trachea midline and thyroid not enlarged, symmetric, no tenderness/mass/nodules Back: symmetric, no curvature. ROM normal. No CVA tenderness. Lungs: clear to auscultation bilaterally Heart: regular rate and rhythm, S1, S2 normal, no murmur, click, rub or gallop Abdomen: soft, non-tender; bowel sounds normal; no masses,  no organomegaly Pulses: 2+ and symmetric Skin: Skin color, texture, turgor normal. No rashes or lesions Lymph nodes: Cervical, supraclavicular, and axillary nodes normal. Neuro: Grossly nonfocal. Normal DTRs. Normal finger to nose. Negative pronator drift. Cannot rise from seated position without use of arms. Cannot do one legged calf raises  . Assessment and Plan:  Proximal leg weakness Normal DTRs, distal leg strength, and notable muscle atrophy in the hip flexors and extenders. Checking TSH muscle enzymes, CBC, and see metastases. Referral to [place] physical therapy for strength training. If she fails to gain strength with physical therapy she will neurology evaluation occult causes of falls.  Recurrent falls Her gait is normal. She has a normal neurologic exam. I believe her falls are due to proximal muscle weakness due to inactivity. Refer to PT.  Head injury, closed, without LOC She has had 3 falls in the last 6 weeks which resulted in blunt trauma to the head. She has a healing ecchymosis over her right eye. MRI of brain ordered to rule out subdural hematoma and space-occupying mass.   A total of 40 minutes was spent with patient more than half of which was spent in counseling, reviewing records from other prviders and coordination of care. Updated  Medication List Outpatient Encounter Prescriptions as of 10/06/2012  Medication Sig Dispense Refill  . acetaminophen (TYLENOL) 325 MG tablet Take 325 mg by mouth as needed.       Marland Kitchen alendronate (FOSAMAX) 70 MG tablet Take 1 tablet (70 mg total) by mouth every 7 (seven) days. Take with a full glass of water on an empty stomach.  4 tablet  3  . amLODipine (NORVASC) 5 MG tablet Take 1 tablet (5 mg total) by mouth daily.  90 tablet  3  . aspirin 81 MG tablet Take 81 mg by mouth daily.        . calcium carbonate (OS-CAL) 600 MG TABS Take 600 mg by mouth 2 (two) times daily with a meal.        .  citalopram (CELEXA) 20 MG tablet Take 1 tablet (20 mg total) by mouth daily.  90 tablet  3  . clobetasol ointment (TEMOVATE) 0.05 % Apply topically 2 (two) times daily.  30 g  0  . docusate sodium (COLACE) 100 MG capsule Take 100 mg by mouth daily.        . enalapril (VASOTEC) 20 MG tablet TAKE ONE TABLET BY MOUTH EVERY DAY  30 tablet  0  . eszopiclone (LUNESTA) 2 MG TABS Take 1 tablet (2 mg total) by mouth at bedtime. Take immediately before bedtime  90 tablet  2  . fluticasone (FLONASE) 50 MCG/ACT nasal spray Place 2 sprays into the nose daily.  16 g  6  . ibuprofen (ADVIL,MOTRIN) 600 MG tablet Take 600 mg by mouth 3 (three) times daily as needed.        . pravastatin (PRAVACHOL) 40 MG tablet Take 1 tablet (40 mg total) by mouth daily.  90 tablet  3  . promethazine (PHENERGAN) 12.5 MG tablet Take 1 tablet (12.5 mg total) by mouth every 8 (eight) hours as needed for nausea.  20 tablet  0  . traMADol (ULTRAM) 50 MG tablet Take 50 mg by mouth every 6 (six) hours as needed.      . benzonatate (TESSALON) 200 MG capsule TAKE ONE CAPSULE BY MOUTH THREE TIMES DAILY AS NEEDED FOR COUGH  60 capsule  1  . cholecalciferol (VITAMIN D) 1000 UNITS tablet Take 1,000 Units by mouth daily.        Marland Kitchen dicyclomine (BENTYL) 10 MG capsule Take 1 capsule (10 mg total) by mouth daily before breakfast.  30 capsule  3  . ergocalciferol  (DRISDOL) 50000 UNITS capsule Take 1 capsule (50,000 Units total) by mouth once a week.  4 capsule  0  . ketorolac (ACULAR) 0.4 % SOLN       . NON FORMULARY Fiber cap one daily       . nystatin (MYCOSTATIN) ointment Apply 1 application topically as needed.        Marland Kitchen omeprazole (PRILOSEC) 20 MG capsule Take 20 mg by mouth daily.        . prednisoLONE acetate (PRED FORTE) 1 % ophthalmic suspension       . triamcinolone cream (KENALOG) 0.1 % Apply 1 application topically 2 (two) times daily. As needed for itching  30 g  3  . Vitamin D, Ergocalciferol, (DRISDOL) 50000 UNITS CAPS Take 1 capsule (50,000 Units total) by mouth every 7 (seven) days.  4 capsule  3  . ZYMAXID 0.5 % SOLN        No facility-administered encounter medications on file as of 10/06/2012.     Orders Placed This Encounter  Procedures  . MR Brain Wo Contrast  . CBC with Differential  . Comprehensive metabolic panel  . TSH  . CK  . Ambulatory referral to Physical Therapy    No Follow-up on file.

## 2012-10-07 NOTE — Addendum Note (Signed)
Addended by: Sherlene Shams on: 10/07/2012 06:51 AM   Modules accepted: Orders

## 2012-10-09 ENCOUNTER — Telehealth: Payer: Self-pay | Admitting: *Deleted

## 2012-10-09 MED ORDER — ESZOPICLONE 2 MG PO TABS: 2.0000 mg | ORAL_TABLET | Freq: Every day | ORAL | Status: DC

## 2012-10-09 MED ORDER — PROMETHAZINE HCL 12.5 MG PO TABS: 12.5000 mg | ORAL_TABLET | Freq: Three times a day (TID) | ORAL | Status: DC | PRN

## 2012-10-09 NOTE — Telephone Encounter (Signed)
Patient con cernedabout MRI scheduling will notify patient waiting for insurance approval. Patient also called about husbands script for glipizide which is at [facility] confirmed. Cheyenne Torres stated you were going to refill her Lunesta and promethazine for nausea please advise.

## 2012-10-09 NOTE — Telephone Encounter (Signed)
Refills authorized,  You can call in lunesta or fax if signed.

## 2012-10-10 NOTE — Telephone Encounter (Signed)
Script called to pharmacy

## 2012-10-13 LAB — HM DIABETES EYE EXAM: HM Diabetic Eye Exam: NORMAL

## 2012-10-15 ENCOUNTER — Telehealth: Payer: Self-pay | Admitting: Internal Medicine

## 2012-10-15 MED ORDER — ALPRAZOLAM 0.5 MG PO TABS: 0.5000 mg | ORAL_TABLET | Freq: Every evening | ORAL | Status: DC | PRN

## 2012-10-15 NOTE — Telephone Encounter (Signed)
Patient son states you were going to try Ambien and Alfonso Patten was called in. After visit summary states to stop Lunesta but Ambien is not seen in med list. Please advise.

## 2012-10-15 NOTE — Telephone Encounter (Signed)
Patient notified script faxed.

## 2012-10-15 NOTE — Telephone Encounter (Signed)
Ok to call in alprazolam per chart to [place] [facility]

## 2012-10-15 NOTE — Telephone Encounter (Signed)
We discussed stopping the lunesta and using alprazolam, not ambien, for early  morning insomnia.  If she accepterd The lunesta, she can use it but it  was refilled bc a refill request was received. i will dc the refills on it. Call when she is runnign out and we will try the alprazolam as dicussed

## 2012-10-15 NOTE — Telephone Encounter (Signed)
The patient was switched to generic Ambien by Dr. Darrick Huntsman ,but the Alfonso Patten was called into the pharmacy.

## 2012-10-15 NOTE — Telephone Encounter (Signed)
Patient did not pick up the Lunesta , patient does need the Alprazolam for early morning insomnia.

## 2012-10-17 ENCOUNTER — Ambulatory Visit: Payer: Self-pay | Admitting: Internal Medicine

## 2012-10-17 ENCOUNTER — Other Ambulatory Visit: Payer: Medicare Other

## 2012-10-20 ENCOUNTER — Other Ambulatory Visit (INDEPENDENT_AMBULATORY_CARE_PROVIDER_SITE_OTHER): Payer: Medicare Other

## 2012-10-20 ENCOUNTER — Telehealth: Payer: Self-pay | Admitting: Internal Medicine

## 2012-10-20 ENCOUNTER — Ambulatory Visit: Payer: Self-pay | Admitting: Internal Medicine

## 2012-10-20 DIAGNOSIS — E119 Type 2 diabetes mellitus without complications: Secondary | ICD-10-CM

## 2012-10-20 DIAGNOSIS — D649 Anemia, unspecified: Secondary | ICD-10-CM

## 2012-10-20 LAB — VITAMIN B12: Vitamin B-12: 184 pg/mL — ABNORMAL LOW (ref 211–911)

## 2012-10-20 LAB — MICROALBUMIN / CREATININE URINE RATIO: Microalb Creat Ratio: 1.9 mg/g (ref 0.0–30.0)

## 2012-10-20 LAB — HEMOGLOBIN A1C: Hgb A1c MFr Bld: 6.6 % — ABNORMAL HIGH (ref 4.6–6.5)

## 2012-10-20 NOTE — Telephone Encounter (Signed)
Opened in error

## 2012-10-20 NOTE — Telephone Encounter (Signed)
MRI of the brain was normal.

## 2012-10-21 ENCOUNTER — Encounter: Payer: Self-pay | Admitting: Internal Medicine

## 2012-10-21 DIAGNOSIS — D519 Vitamin B12 deficiency anemia, unspecified: Secondary | ICD-10-CM | POA: Insufficient documentation

## 2012-10-21 LAB — IRON AND TIBC
Iron: 56 ug/dL (ref 42–145)
TIBC: 298 ug/dL (ref 250–470)
UIBC: 242 ug/dL (ref 125–400)

## 2012-10-22 ENCOUNTER — Other Ambulatory Visit: Payer: Self-pay | Admitting: Internal Medicine

## 2012-10-24 ENCOUNTER — Telehealth: Payer: Self-pay | Admitting: Internal Medicine

## 2012-10-24 NOTE — Telephone Encounter (Signed)
Maurine Minister called he wanted to leave info about Cheyenne Torres getting her b12 shots done locally where she lives [facility] [place] [address] [phone]  Cheyenne Lindie Spruce needs the rx sent to her to get Cheyenne Dooly started her on her shots

## 2012-10-24 NOTE — Telephone Encounter (Signed)
Patient notified

## 2012-10-24 NOTE — Telephone Encounter (Signed)
Left message to call back  

## 2012-10-25 ENCOUNTER — Other Ambulatory Visit: Payer: Self-pay | Admitting: Internal Medicine

## 2012-10-27 MED ORDER — CYANOCOBALAMIN 1000 MCG/ML IJ SOLN: 1000.0000 ug | Freq: Once | INTRAMUSCULAR | Status: DC

## 2012-10-27 MED ORDER — FERROUS FUM-IRON POLYSACCH 162-115.2 MG PO CAPS: 1.0000 | ORAL_CAPSULE | Freq: Every day | ORAL | Status: DC

## 2012-10-27 NOTE — Addendum Note (Signed)
Addended by: Sherlene Shams on: 10/27/2012 11:29 AM   Modules accepted: Orders

## 2012-10-27 NOTE — Telephone Encounter (Signed)
Did you want her to continue this? 

## 2012-10-30 NOTE — Telephone Encounter (Signed)
Script called in patient notified

## 2012-11-12 ENCOUNTER — Other Ambulatory Visit: Payer: Self-pay | Admitting: Internal Medicine

## 2012-11-13 ENCOUNTER — Telehealth: Payer: Self-pay | Admitting: Internal Medicine

## 2012-11-13 NOTE — Telephone Encounter (Signed)
Pt called questioning her appointment.  Pt stated on her after visit summary it says to come back in 6 months. But when she checked out her appointment was made for 01/06/18 for an annual wellness. Pt would like to know what appointment she needs

## 2012-11-14 NOTE — Telephone Encounter (Signed)
Left message for patient to return call, as to patient confusion on reason for visit on 01/06/13 is medicare wellness, patient is looking at cause for previous visit on 10/06/12

## 2012-11-26 ENCOUNTER — Telehealth: Payer: Self-pay | Admitting: *Deleted

## 2012-11-26 NOTE — Telephone Encounter (Signed)
Patient is taking iron supplement and is to do a FOB she is concerned it may cause a false positive.

## 2012-12-01 NOTE — Telephone Encounter (Signed)
It will not,  The new ones check for blood with DNA not iron

## 2012-12-02 NOTE — Telephone Encounter (Signed)
Patient notified patient also requested new kit for husband Zakeria Kulzer mailed this date to patient.

## 2012-12-17 ENCOUNTER — Encounter: Payer: Self-pay | Admitting: Internal Medicine

## 2012-12-17 ENCOUNTER — Other Ambulatory Visit: Payer: Medicare Other

## 2012-12-17 LAB — FECAL OCCULT BLOOD, IMMUNOCHEMICAL: Fecal Occult Bld: NEGATIVE

## 2013-01-05 ENCOUNTER — Encounter: Payer: Self-pay | Admitting: *Deleted

## 2013-01-06 ENCOUNTER — Ambulatory Visit (INDEPENDENT_AMBULATORY_CARE_PROVIDER_SITE_OTHER): Payer: Medicare Other | Admitting: Internal Medicine

## 2013-01-06 ENCOUNTER — Encounter: Payer: Self-pay | Admitting: Internal Medicine

## 2013-01-06 VITALS — BP 138/66 | HR 81 | Temp 98.0°F | Resp 12 | Ht 67.0 in | Wt 123.5 lb

## 2013-01-06 DIAGNOSIS — R29898 Other symptoms and signs involving the musculoskeletal system: Secondary | ICD-10-CM

## 2013-01-06 DIAGNOSIS — D519 Vitamin B12 deficiency anemia, unspecified: Secondary | ICD-10-CM

## 2013-01-06 DIAGNOSIS — D509 Iron deficiency anemia, unspecified: Secondary | ICD-10-CM

## 2013-01-06 DIAGNOSIS — Z0000 Encounter for general adult medical examination without abnormal findings: Secondary | ICD-10-CM

## 2013-01-06 DIAGNOSIS — Z1239 Encounter for other screening for malignant neoplasm of breast: Secondary | ICD-10-CM

## 2013-01-06 DIAGNOSIS — R634 Abnormal weight loss: Secondary | ICD-10-CM

## 2013-01-06 DIAGNOSIS — D518 Other vitamin B12 deficiency anemias: Secondary | ICD-10-CM

## 2013-01-06 DIAGNOSIS — E559 Vitamin D deficiency, unspecified: Secondary | ICD-10-CM

## 2013-01-06 LAB — CBC WITH DIFFERENTIAL/PLATELET
Basophils Relative: 0.6 % (ref 0.0–3.0)
Eosinophils Absolute: 0.1 10*3/uL (ref 0.0–0.7)
Eosinophils Relative: 0.9 % (ref 0.0–5.0)
HCT: 34.7 % — ABNORMAL LOW (ref 36.0–46.0)
Hemoglobin: 11.7 g/dL — ABNORMAL LOW (ref 12.0–15.0)
MCHC: 33.8 g/dL (ref 30.0–36.0)
MCV: 85.1 fl (ref 78.0–100.0)
Monocytes Absolute: 0.5 10*3/uL (ref 0.1–1.0)
Neutro Abs: 5.6 10*3/uL (ref 1.4–7.7)
RBC: 4.07 Mil/uL (ref 3.87–5.11)
WBC: 9.2 10*3/uL (ref 4.5–10.5)

## 2013-01-06 LAB — HM DIABETES FOOT EXAM: HM Diabetic Foot Exam: NORMAL

## 2013-01-06 LAB — FECAL OCCULT BLOOD, GUAIAC: Fecal Occult Blood: NEGATIVE

## 2013-01-06 MED ORDER — ZOSTER VACCINE LIVE 19400 UNT/0.65ML ~~LOC~~ SOLR: 0.6500 mL | Freq: Once | SUBCUTANEOUS | Status: DC

## 2013-01-06 MED ORDER — TETANUS-DIPHTH-ACELL PERTUSSIS 5-2.5-18.5 LF-MCG/0.5 IM SUSP: 0.5000 mL | Freq: Once | INTRAMUSCULAR | Status: DC

## 2013-01-06 MED ORDER — GLUCOSE BLOOD VI STRP: ORAL_STRIP | Status: DC

## 2013-01-06 NOTE — Assessment & Plan Note (Signed)
Improved with PT but she is not motivated to continue daily exercise

## 2013-01-06 NOTE — Assessment & Plan Note (Addendum)
Improved, with 5 lb wt gain since last visit, BMI now 19.

## 2013-01-06 NOTE — Assessment & Plan Note (Signed)
Advised to contineu monthly injections and increase to weekly if she perceives a benefit.

## 2013-01-06 NOTE — Assessment & Plan Note (Signed)
Annual comprehensive exam was done including breast, excluding pelvic and PAP smear. All screenings have been addressed .  

## 2013-01-06 NOTE — Progress Notes (Addendum)
Patient ID: Cheyenne Torres, female   DOB: [birth year], 79 y.o.   MRN: [id number]  The patient is here for annual Medicare wellness examination and management of other chronic and acute problems.  Feeling better since the B12 deficiency was identified and treated with weekly b12 injections .  Receiving monthly injectiions by local pharmacy.  She completed 4 weeks of physical therapy for proximal muscle weakness and history of falls but has not taken the initiative to started a walking program.  Her husband walks daily but she has not joined him.  She cites allergy to mosquito bites as the reason.   Weight loss:  She has gained 5 lbs since last visit by increasing her consumption of dairy products.    DM: her glucometer has not been working .   Her test strips are a year out of date.   The risk factors are reflected in the social history.  The roster of all physicians providing medical care to patient - is listed in the Snapshot section of the chart.  Activities of daily living:  The patient is 100% independent in all ADLs: dressing, toileting, feeding as well as independent mobility  Home safety : The patient has smoke detectors in the home. They wear seatbelts.  There are no firearms at home. There is no violence in the home.   There is no risks for hepatitis, STDs or HIV. There is no   history of blood transfusion. They have no travel history to infectious disease endemic areas of the world.  The patient has seen their dentist in the last six month. They have seen their eye doctor in the last year. They admit to slight hearing difficulty with regard to whispered voices and some television programs.  They have deferred audiologic testing in the last year.  They do not  have excessive sun exposure. Discussed the need for sun protection: hats, long sleeves and use of sunscreen if there is significant sun exposure.   Diet: the importance of a healthy diet is discussed. They do have a healthy  diet.  The benefits of regular aerobic exercise were discussed. She walks 4 times per week ,  20 minutes.   Depression screen: there are no signs or vegative symptoms of depression- irritability, change in appetite, anhedonia, sadness/tearfullness.  Cognitive assessment: the patient manages all their financial and personal affairs and is actively engaged. They could relate day,date,year and events; recalled 2/3 objects at 3 minutes; performed clock-face test normally.  The following portions of the patient's history were reviewed and updated as appropriate: allergies, current medications, past family history, past medical history,  past surgical history, past social history  and problem list.  Visual acuity was not assessed per patient preference since she has regular follow up with her ophthalmologist. Hearing and body mass index were assessed and reviewed.   During the course of the visit the patient was educated and counseled about appropriate screening and preventive services including : fall prevention , diabetes screening, nutrition counseling, colorectal cancer screening, and recommended immunizations.    Objective:  BP 138/66  Pulse 81  Temp(Src) 98 F (36.7 C) (Oral)  Resp 12  Ht 5\' 7"  (1.702 m)  Wt 123 lb 8 oz (56.019 kg)  BMI 19.34 kg/m2  SpO2 95%  General Appearance:    Alert, cooperative, no distress, appears stated age  Head:    Normocephalic, without obvious abnormality, atraumatic  Eyes:    PERRL, conjunctiva/corneas clear, EOM's intact, fundi  benign, both eyes  Ears:    Normal TM's and external ear canals, both ears  Nose:   Nares normal, septum midline, mucosa normal, no drainage    or sinus tenderness  Throat:   Lips, mucosa, and tongue normal; teeth and gums normal  Neck:   Supple, symmetrical, trachea midline, no adenopathy;    thyroid:  no enlargement/tenderness/nodules; no carotid   bruit or JVD  Back:     Symmetric, no curvature, ROM normal, no CVA  tenderness  Lungs:     Clear to auscultation bilaterally, respirations unlabored  Chest Wall:    No tenderness or deformity   Heart:    Regular rate and rhythm, S1 and S2 normal, no murmur, rub   or gallop  Breast Exam:    No tenderness, masses, or nipple abnormality on the right , left mastectomy scar , no masses  Abdomen:     Soft, non-tender, bowel sounds active all four quadrants,    no masses, no organomegaly     Extremities:   Extremities normal, atraumatic, no cyanosis or edema  Pulses:   2+ and symmetric all extremities  Skin:   Skin color, texture, turgor normal, no rashes or lesions  Lymph nodes:   Cervical, supraclavicular, and axillary nodes normal  Neurologic:   CNII-XII intact, normal strength, sensation and reflexes    Throughout Foot exam:  Nails are well trimmed,  No callouses,  Sensation intact to microfilament    Assessment and Plan:  Weight loss, unintentional Improved, with 5 lb wt gain since last visit, BMI now 19.    B12 deficiency anemia Advised to contineu monthly injections and increase to weekly if she perceives a benefit.   Proximal leg weakness Improved with PT but she is not motivated to continue daily exercise  Routine general medical examination at a health care facility Annual comprehensive exam was done including breast, excluding pelvic and PAP smear. All screenings have been addressed .   Unspecified vitamin D deficiency Drisdol prescribed. Vit d was 21.   Iron deficiency anemia Improving. Continue iron supplements once daily.   Updated Medication List Outpatient Encounter Prescriptions as of 01/06/2013  Medication Sig Dispense Refill  . acetaminophen (TYLENOL) 325 MG tablet Take 325 mg by mouth as needed.       Marland Kitchen alendronate (FOSAMAX) 70 MG tablet Take 1 tablet (70 mg total) by mouth every 7 (seven) days. Take with a full glass of water on an empty stomach.  4 tablet  3  . ALPRAZolam (XANAX) 0.5 MG tablet Take 1 tablet (0.5 mg total) by  mouth at bedtime as needed for sleep.  30 tablet  3  . amLODipine (NORVASC) 5 MG tablet TAKE ONE TABLET BY MOUTH EVERY DAY  90 tablet  1  . aspirin 81 MG tablet Take 81 mg by mouth daily.        . calcium carbonate (OS-CAL) 600 MG TABS Take 600 mg by mouth 2 (two) times daily with a meal.        . cholecalciferol (VITAMIN D) 1000 UNITS tablet Take 1,000 Units by mouth daily.        . clobetasol ointment (TEMOVATE) 0.05 % Apply topically 2 (two) times daily.  30 g  0  . cyanocobalamin (,VITAMIN B-12,) 1000 MCG/ML injection Inject 1 mL (1,000 mcg total) into the muscle once. I ml IM injection  weekly for 3 weeks,  Then monthly  10 mL  2  . dicyclomine (BENTYL) 10 MG capsule Take  1 capsule (10 mg total) by mouth daily before breakfast.  30 capsule  3  . docusate sodium (COLACE) 100 MG capsule Take 100 mg by mouth daily.        . enalapril (VASOTEC) 20 MG tablet TAKE ONE TABLET BY MOUTH ONCE DAILY  30 tablet  5  . eszopiclone (LUNESTA) 2 MG TABS tablet Take 2 mg by mouth at bedtime as needed. Take immediately before bedtime      . ferrous fumarate-iron polysaccharide complex (TANDEM) 162-115.2 MG CAPS Take 1 capsule by mouth daily with breakfast.  30 capsule  3  . fluticasone (FLONASE) 50 MCG/ACT nasal spray Place 2 sprays into the nose daily.  16 g  6  . pravastatin (PRAVACHOL) 40 MG tablet Take 1 tablet (40 mg total) by mouth daily.  90 tablet  3  . promethazine (PHENERGAN) 12.5 MG tablet Take 1 tablet (12.5 mg total) by mouth every 8 (eight) hours as needed for nausea.  20 tablet  0  . traMADol (ULTRAM) 50 MG tablet Take 50 mg by mouth every 6 (six) hours as needed.      . Vitamin D, Ergocalciferol, (DRISDOL) 50000 UNITS CAPS capsule TAKE ONE CAPSULE BY MOUTH ONCE A WEEK  4 capsule  3  . [DISCONTINUED] ibuprofen (ADVIL,MOTRIN) 600 MG tablet Take 600 mg by mouth 3 (three) times daily as needed.        . [DISCONTINUED] Vitamin D, Ergocalciferol, (DRISDOL) 50000 UNITS CAPS TAKE ONE CAPSULE BY MOUTH  ONCE A WEEK  4 capsule  0  . citalopram (CELEXA) 20 MG tablet TAKE ONE TABLET BY MOUTH EVERY DAY  90 tablet  0  . glucose blood (FREESTYLE LITE) test strip Use once daily to  check blood sugars   250.00  90 day supply  100 each  3  . ketorolac (ACULAR) 0.4 % SOLN       . NON FORMULARY Fiber cap one daily       . nystatin (MYCOSTATIN) ointment Apply 1 application topically as needed.        Marland Kitchen omeprazole (PRILOSEC) 20 MG capsule Take 20 mg by mouth daily.        . prednisoLONE acetate (PRED FORTE) 1 % ophthalmic suspension       . TDaP (BOOSTRIX) 5-2.5-18.5 LF-MCG/0.5 injection Inject 0.5 mLs into the muscle once.  0.5 mL  0  . triamcinolone cream (KENALOG) 0.1 % Apply 1 application topically 2 (two) times daily. As needed for itching  30 g  3  . zoster vaccine live, PF, (ZOSTAVAX) 14782 UNT/0.65ML injection Inject 19,400 Units into the skin once.  1 each  0  . ZYMAXID 0.5 % SOLN       . [DISCONTINUED] alendronate (FOSAMAX) 70 MG tablet TAKE ONE TABLET BY MOUTH ONCE A WEEK IN THE MORNING WITH A GLASS OF WATER, 30 MINUTES BEFORE A MEAL OR BEVERAGE.  REMAIN UPRIGHT  4 tablet  11  . [DISCONTINUED] benzonatate (TESSALON) 200 MG capsule TAKE ONE CAPSULE BY MOUTH THREE TIMES DAILY AS NEEDED FOR COUGH  60 capsule  1  . [DISCONTINUED] zoster vaccine live, PF, (ZOSTAVAX) 95621 UNT/0.65ML injection Inject 19,400 Units into the skin once.  1 each  0   No facility-administered encounter medications on file as of 01/06/2013.

## 2013-01-06 NOTE — Patient Instructions (Signed)
You had your annual Medicare wellness exam today  We will schedule your mammogram and call you with the appt     You need to have a TDaP vaccine and a Shingles vaccine.  I have given you prescriptions for thses because they will be cheaper at the health Dept or at your  local pharmacy because Medicare will not reimburse for them.   You received the influenza  We will contact you with the bloodwork results  Return in December for diabetes checkup.  Please get labs (fasting) a day or two prior

## 2013-01-07 LAB — IRON AND TIBC: UIBC: 264 ug/dL (ref 125–400)

## 2013-01-08 ENCOUNTER — Encounter: Payer: Self-pay | Admitting: Internal Medicine

## 2013-01-08 DIAGNOSIS — E559 Vitamin D deficiency, unspecified: Secondary | ICD-10-CM | POA: Insufficient documentation

## 2013-01-08 DIAGNOSIS — D509 Iron deficiency anemia, unspecified: Secondary | ICD-10-CM | POA: Insufficient documentation

## 2013-01-08 MED ORDER — VITAMIN D (ERGOCALCIFEROL) 1.25 MG (50000 UNIT) PO CAPS: ORAL_CAPSULE | ORAL | Status: DC

## 2013-01-08 NOTE — Addendum Note (Signed)
Addended by: Sherlene Shams on: 01/08/2013 07:00 AM   Modules accepted: Orders, Level of Service

## 2013-01-08 NOTE — Assessment & Plan Note (Signed)
Drisdol prescribed. Vit d was 21.

## 2013-01-08 NOTE — Assessment & Plan Note (Signed)
Improving. Continue iron supplements once daily.

## 2013-01-15 ENCOUNTER — Ambulatory Visit: Payer: Self-pay | Admitting: Internal Medicine

## 2013-01-30 ENCOUNTER — Encounter: Payer: Self-pay | Admitting: Internal Medicine

## 2013-02-27 ENCOUNTER — Telehealth: Payer: Self-pay | Admitting: *Deleted

## 2013-02-27 MED ORDER — FERROUS FUM-IRON POLYSACCH 162-115.2 MG PO CAPS: 1.0000 | ORAL_CAPSULE | Freq: Every day | ORAL | Status: DC

## 2013-03-05 ENCOUNTER — Other Ambulatory Visit: Payer: Self-pay

## 2013-03-10 NOTE — Telephone Encounter (Signed)
Opened by mistake.

## 2013-03-11 ENCOUNTER — Other Ambulatory Visit: Payer: Self-pay | Admitting: Internal Medicine

## 2013-03-11 NOTE — Telephone Encounter (Signed)
Refill

## 2013-03-13 ENCOUNTER — Other Ambulatory Visit: Payer: Self-pay | Admitting: Internal Medicine

## 2013-04-01 ENCOUNTER — Other Ambulatory Visit: Payer: Medicare Other

## 2013-04-07 ENCOUNTER — Telehealth: Payer: Self-pay | Admitting: *Deleted

## 2013-04-07 DIAGNOSIS — E119 Type 2 diabetes mellitus without complications: Secondary | ICD-10-CM

## 2013-04-07 NOTE — Telephone Encounter (Signed)
Pt is coming in tomorrow what labs and dX? 

## 2013-04-08 ENCOUNTER — Ambulatory Visit: Payer: Medicare Other | Admitting: Internal Medicine

## 2013-04-08 ENCOUNTER — Other Ambulatory Visit (INDEPENDENT_AMBULATORY_CARE_PROVIDER_SITE_OTHER): Payer: Medicare Other

## 2013-04-08 DIAGNOSIS — E119 Type 2 diabetes mellitus without complications: Secondary | ICD-10-CM

## 2013-04-08 LAB — COMPREHENSIVE METABOLIC PANEL
ALT: 13 U/L (ref 0–35)
Alkaline Phosphatase: 58 U/L (ref 39–117)
BUN: 32 mg/dL — ABNORMAL HIGH (ref 6–23)
CO2: 25 mEq/L (ref 19–32)
Calcium: 9.5 mg/dL (ref 8.4–10.5)
GFR: 64.14 mL/min (ref >60.00)
Glucose, Bld: 151 mg/dL — ABNORMAL HIGH (ref 70–99)
Sodium: 141 mEq/L (ref 135–145)
Total Bilirubin: 0.4 mg/dL (ref 0.3–1.2)
Total Protein: 8.3 g/dL (ref 6.0–8.3)

## 2013-04-08 LAB — MICROALBUMIN / CREATININE URINE RATIO: Microalb, Ur: 9.7 mg/dL — ABNORMAL HIGH (ref 0.0–1.9)

## 2013-04-08 LAB — LIPID PANEL
HDL: 50.3 mg/dL (ref >39.00)
LDL Cholesterol: 64 mg/dL (ref 0–99)
Total CHOL/HDL Ratio: 3
Triglycerides: 103 mg/dL (ref 0.0–149.0)

## 2013-04-10 ENCOUNTER — Encounter: Payer: Self-pay | Admitting: Internal Medicine

## 2013-04-14 ENCOUNTER — Ambulatory Visit (INDEPENDENT_AMBULATORY_CARE_PROVIDER_SITE_OTHER): Payer: Medicare Other | Admitting: Internal Medicine

## 2013-04-14 VITALS — BP 118/70 | HR 90 | Temp 97.8°F | Wt 124.0 lb

## 2013-04-14 DIAGNOSIS — E559 Vitamin D deficiency, unspecified: Secondary | ICD-10-CM

## 2013-04-14 DIAGNOSIS — D518 Other vitamin B12 deficiency anemias: Secondary | ICD-10-CM

## 2013-04-14 DIAGNOSIS — D509 Iron deficiency anemia, unspecified: Secondary | ICD-10-CM

## 2013-04-14 DIAGNOSIS — Z853 Personal history of malignant neoplasm of breast: Secondary | ICD-10-CM

## 2013-04-14 DIAGNOSIS — F320 Major depressive disorder, single episode, mild: Secondary | ICD-10-CM

## 2013-04-14 DIAGNOSIS — E785 Hyperlipidemia, unspecified: Secondary | ICD-10-CM

## 2013-04-14 DIAGNOSIS — I10 Essential (primary) hypertension: Secondary | ICD-10-CM

## 2013-04-14 DIAGNOSIS — D519 Vitamin B12 deficiency anemia, unspecified: Secondary | ICD-10-CM

## 2013-04-14 DIAGNOSIS — R29898 Other symptoms and signs involving the musculoskeletal system: Secondary | ICD-10-CM

## 2013-04-14 DIAGNOSIS — E119 Type 2 diabetes mellitus without complications: Secondary | ICD-10-CM

## 2013-04-14 MED ORDER — CYANOCOBALAMIN 1000 MCG SL SUBL: SUBLINGUAL_TABLET | SUBLINGUAL | Status: DC

## 2013-04-14 NOTE — Patient Instructions (Addendum)
For your low vitamin D  finish the weekly megadose all  Refills, , then start taking OTC Vit D3 2000 units daily until spring   Check your blood pressure and your blood pressure the next time you feel weak   BP should be 100 to 140/ 70 to 80   If it is recurrently low we will reduce your amlodipine   We are switching your B12 to a sublingual tablet that you take daily    You received your last pneumonia vaccine today  Keep moving!!  I want you walking for 15 minutes every day

## 2013-04-14 NOTE — Progress Notes (Signed)
Pre visit review using our clinic review tool, if applicable. No additional management support is needed unless otherwise documented below in the visit note. 

## 2013-04-16 ENCOUNTER — Encounter: Payer: Self-pay | Admitting: Internal Medicine

## 2013-04-16 NOTE — Assessment & Plan Note (Signed)
Improved with PT and B12 supplementation. However she continues to avoid regular daily exercise. I've counseled her again on the need for daily exercise to prevent deterioration.

## 2013-04-16 NOTE — Assessment & Plan Note (Signed)
Managed with alendronate and supplementation of vitamin D. By Monday level will be rechecked next visit.

## 2013-04-16 NOTE — Assessment & Plan Note (Signed)
Improved with SSRI.  Weight  Loss stopped and she is feeling better.

## 2013-04-16 NOTE — Assessment & Plan Note (Signed)
Well controlled. She is taking an aspirin,  Statin and ACE Inhibitor. Foot exam is normal. She has had episodes of malaise but have not been evaluated with Accu-Cheks. She's been given a new meter today and advised to check her blood sugar the next time she has an episode  Lab Results  Component Value Date   HGBA1C 6.9* 04/08/2013   Lab Results  Component Value Date   MICROALBUR 9.7* 04/08/2013
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
## 2013-04-16 NOTE — Assessment & Plan Note (Signed)
Status post left mastectomy remotely in 1989 followed by chemo and tamoxifen x 6 yrs. . Mammogram was normal in September 2014

## 2013-04-16 NOTE — Assessment & Plan Note (Addendum)
Improving. Continue iron supplements once daily. Diagnostic colonoscopy in July 2013 showed diverticulosis and polyp.  Lab Results  Component Value Date   WBC 9.2 01/06/2013   HGB 11.7* 01/06/2013   HCT 34.7* 01/06/2013   MCV 85.1 01/06/2013   PLT 282.0 01/06/2013

## 2013-04-16 NOTE — Assessment & Plan Note (Addendum)
Found during screening for leg weakness and falls. June 2014. I have advised her to Continue monthly injections for as long as she is willing to do so but she would like to transition to sublingual tablets .  We will  rechecking a B12 level in several months

## 2013-04-16 NOTE — Assessment & Plan Note (Signed)
Using the Framingham risk calculator,  her 10 year risk of coronary artery disease is 17%.and LDL is < 70 on pravastatin.. New ACC guidelines recommend starting patients aged 77 or higher on moderate intensity statin therapy for diabetes and concurrent LDL between 70-189. No changes made today. Lab Results  Component Value Date   CHOL 135 04/08/2013   HDL 50.30 04/08/2013   LDLCALC 64 04/08/2013   TRIG 103.0 04/08/2013   CHOLHDL 3 04/08/2013

## 2013-04-16 NOTE — Assessment & Plan Note (Addendum)
Well controlled on current regimen. Renal function stable. We discussed reducing her amlodipine to 2.5 mg daily if she continues to have episodes of malaise. Lab Results  Component Value Date   NA 141 04/08/2013   K 4.5 04/08/2013   CL 106 04/08/2013   CO2 25 04/08/2013   Lab Results  Component Value Date   CREATININE 0.9 04/08/2013

## 2013-04-17 ENCOUNTER — Other Ambulatory Visit: Payer: Self-pay | Admitting: Internal Medicine

## 2013-05-02 LAB — HM DIABETES EYE EXAM

## 2013-05-14 ENCOUNTER — Other Ambulatory Visit: Payer: Self-pay | Admitting: Internal Medicine

## 2013-06-09 ENCOUNTER — Other Ambulatory Visit: Payer: Self-pay | Admitting: Internal Medicine

## 2013-06-30 ENCOUNTER — Other Ambulatory Visit: Payer: Self-pay | Admitting: Internal Medicine

## 2013-09-01 ENCOUNTER — Telehealth: Payer: Self-pay | Admitting: Internal Medicine

## 2013-09-01 MED ORDER — SULFAMETHOXAZOLE-TRIMETHOPRIM 800-160 MG PO TABS: 1.0000 | ORAL_TABLET | Freq: Two times a day (BID) | ORAL | Status: DC

## 2013-09-01 NOTE — Telephone Encounter (Signed)
Pt left vm.  Having a hard time urinating.  Asking for a call.

## 2013-09-01 NOTE — Telephone Encounter (Signed)
I do not treat local patients without seeing.  Can see drop off a urine ir is she > 15 minutes away?

## 2013-09-01 NOTE — Telephone Encounter (Signed)
Urgency, but then cannot go , patient discomfort in lower abdomen, burning urination.

## 2013-09-01 NOTE — Telephone Encounter (Signed)
Disregard prior message.  Septra DS sent to [place]

## 2013-09-01 NOTE — Telephone Encounter (Signed)
Patient notified ABX called to pharmacy and that she needs to take a probiotic with the ABX.

## 2013-10-05 ENCOUNTER — Encounter: Payer: Self-pay | Admitting: Internal Medicine

## 2013-10-05 ENCOUNTER — Ambulatory Visit (INDEPENDENT_AMBULATORY_CARE_PROVIDER_SITE_OTHER): Payer: Medicare HMO | Admitting: Internal Medicine

## 2013-10-05 VITALS — BP 98/66 | HR 72 | Temp 97.6°F | Resp 14 | Ht 67.0 in | Wt 121.5 lb

## 2013-10-05 DIAGNOSIS — R29898 Other symptoms and signs involving the musculoskeletal system: Secondary | ICD-10-CM

## 2013-10-05 DIAGNOSIS — R634 Abnormal weight loss: Secondary | ICD-10-CM

## 2013-10-05 DIAGNOSIS — N390 Urinary tract infection, site not specified: Secondary | ICD-10-CM

## 2013-10-05 DIAGNOSIS — D126 Benign neoplasm of colon, unspecified: Secondary | ICD-10-CM | POA: Insufficient documentation

## 2013-10-05 DIAGNOSIS — R32 Unspecified urinary incontinence: Secondary | ICD-10-CM

## 2013-10-05 DIAGNOSIS — E559 Vitamin D deficiency, unspecified: Secondary | ICD-10-CM

## 2013-10-05 DIAGNOSIS — D509 Iron deficiency anemia, unspecified: Secondary | ICD-10-CM

## 2013-10-05 DIAGNOSIS — Z23 Encounter for immunization: Secondary | ICD-10-CM

## 2013-10-05 DIAGNOSIS — I10 Essential (primary) hypertension: Secondary | ICD-10-CM

## 2013-10-05 DIAGNOSIS — E119 Type 2 diabetes mellitus without complications: Secondary | ICD-10-CM

## 2013-10-05 DIAGNOSIS — R1310 Dysphagia, unspecified: Secondary | ICD-10-CM | POA: Insufficient documentation

## 2013-10-05 DIAGNOSIS — D519 Vitamin B12 deficiency anemia, unspecified: Secondary | ICD-10-CM

## 2013-10-05 DIAGNOSIS — D518 Other vitamin B12 deficiency anemias: Secondary | ICD-10-CM

## 2013-10-05 LAB — CBC WITH DIFFERENTIAL/PLATELET
BASOS PCT: 0.6 % (ref 0.0–3.0)
Basophils Absolute: 0.1 10*3/uL (ref 0.0–0.1)
EOS PCT: 2.3 % (ref 0.0–5.0)
Eosinophils Absolute: 0.2 10*3/uL (ref 0.0–0.7)
HEMATOCRIT: 34.2 % — AB (ref 36.0–46.0)
Hemoglobin: 11 g/dL — ABNORMAL LOW (ref 12.0–15.0)
Lymphocytes Relative: 33 % (ref 12.0–46.0)
Lymphs Abs: 2.9 10*3/uL (ref 0.7–4.0)
MCHC: 32.2 g/dL (ref 30.0–36.0)
MCV: 88.1 fl (ref 78.0–100.0)
MONO ABS: 0.5 10*3/uL (ref 0.1–1.0)
MONOS PCT: 5.9 % (ref 3.0–12.0)
Neutro Abs: 5.1 10*3/uL (ref 1.4–7.7)
Neutrophils Relative %: 58.2 % (ref 43.0–77.0)
PLATELETS: 278 10*3/uL (ref 150.0–400.0)
RBC: 3.89 Mil/uL (ref 3.87–5.11)
RDW: 14 % (ref 11.5–15.5)
WBC: 8.8 10*3/uL (ref 4.0–10.5)

## 2013-10-05 LAB — POCT URINALYSIS DIPSTICK
BILIRUBIN UA: NEGATIVE
Glucose, UA: NEGATIVE
Ketones, UA: NEGATIVE
Nitrite, UA: NEGATIVE
Protein, UA: NEGATIVE
SPEC GRAV UA: 1.015
Urobilinogen, UA: 0.2
pH, UA: 7

## 2013-10-05 LAB — COMPREHENSIVE METABOLIC PANEL
ALT: 14 U/L (ref 0–35)
AST: 18 U/L (ref 0–37)
Albumin: 4.3 g/dL (ref 3.5–5.2)
Alkaline Phosphatase: 61 U/L (ref 39–117)
BILIRUBIN TOTAL: 0.5 mg/dL (ref 0.2–1.2)
BUN: 23 mg/dL (ref 6–23)
CO2: 26 mEq/L (ref 19–32)
Calcium: 10 mg/dL (ref 8.4–10.5)
Chloride: 100 mEq/L (ref 96–112)
Creatinine, Ser: 0.9 mg/dL (ref 0.4–1.2)
GFR: 67.51 mL/min (ref >60.00)
GLUCOSE: 117 mg/dL — AB (ref 70–99)
Potassium: 4.2 mEq/L (ref 3.5–5.1)
Sodium: 137 mEq/L (ref 135–145)
Total Protein: 8.1 g/dL (ref 6.0–8.3)

## 2013-10-05 LAB — VITAMIN B12: VITAMIN B 12: 554 pg/mL (ref 211–911)

## 2013-10-05 LAB — HEMOGLOBIN A1C: Hgb A1c MFr Bld: 6.4 % (ref 4.6–6.5)

## 2013-10-05 MED ORDER — CRANBERRY 500 MG PO CAPS: 1.0000 | ORAL_CAPSULE | Freq: Every day | ORAL | Status: DC

## 2013-10-05 NOTE — Assessment & Plan Note (Signed)
She is hypotensive and symptomatic on current regimen.we are stopping her amlodipine and continuing enalapril

## 2013-10-05 NOTE — Assessment & Plan Note (Signed)
Ordering esophageal study to rule out stricture and achalasia.

## 2013-10-05 NOTE — Patient Instructions (Addendum)
You may be getting dizzy bc your blood pressure is running too low   I recommend stopping the amlodipine   And continuing the enalapril.  If your blood pressure readings  stay over 140/80,  Resume the amlodipine at 1/2 tablet or call for new rx for 2.5 mg tablet   You need 3 16 ounce servings of water daily to maiintain hydration   To prevent bladder infections,  Start taking cranberry tablets    You received the new pneumonia vaccine today (Prevnar)
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
## 2013-10-05 NOTE — Assessment & Plan Note (Signed)
Discussed adding cranberry tablets daily to prevent infection.

## 2013-10-05 NOTE — Progress Notes (Signed)
Pre visit review using our clinic review tool, if applicable. No additional management support is needed unless otherwise documented below in the visit note. 

## 2013-10-05 NOTE — Assessment & Plan Note (Signed)
hgb has dropped slightly, and she is having dysphagia.  Esophageal study, iron studies need to be repeated.

## 2013-10-05 NOTE — Assessment & Plan Note (Signed)
Improved with PT and B12 supplementation. she abhors regular daily exercise but inssits that she is walking for 15 minutes daily in the house.  I've counseled her again on the need for daily exercise to prevent deterioration.

## 2013-10-05 NOTE — Assessment & Plan Note (Signed)
Well controlled. She is taking an aspirin,  Statin and ACE Inhibitor. Foot exam is normal. She has had episodes of malaise  And has used glucometer to rule out hypoglycemia .   Lab Results  Component Value Date   HGBA1C 6.4 10/05/2013   Lab Results  Component Value Date   MICROALBUR 9.7* 04/08/2013

## 2013-10-05 NOTE — Assessment & Plan Note (Signed)
She has lost 3 lbs again, likely due to new onset dysphagia for solid food.  Given her history of BRCa. Will puruse workup with esophageal study  

## 2013-10-06 LAB — URINE CULTURE: Colony Count: 30000

## 2013-10-06 LAB — IRON AND TIBC
%SAT: 17 % — AB (ref 20–55)
Iron: 60 ug/dL (ref 42–145)
TIBC: 349 ug/dL (ref 250–470)
UIBC: 289 ug/dL (ref 125–400)

## 2013-10-06 LAB — VITAMIN D 25 HYDROXY (VIT D DEFICIENCY, FRACTURES): VIT D 25 HYDROXY: 38 ng/mL (ref 30–89)

## 2013-10-07 ENCOUNTER — Encounter: Payer: Self-pay | Admitting: Internal Medicine

## 2013-10-08 ENCOUNTER — Other Ambulatory Visit: Payer: Self-pay | Admitting: Internal Medicine

## 2013-10-26 ENCOUNTER — Other Ambulatory Visit: Payer: Self-pay | Admitting: Internal Medicine

## 2013-12-17 ENCOUNTER — Other Ambulatory Visit: Payer: Self-pay | Admitting: Internal Medicine

## 2013-12-18 ENCOUNTER — Ambulatory Visit: Payer: Medicare HMO | Admitting: Family Medicine

## 2013-12-21 ENCOUNTER — Ambulatory Visit: Payer: Medicare HMO | Admitting: Adult Health

## 2013-12-29 ENCOUNTER — Other Ambulatory Visit (INDEPENDENT_AMBULATORY_CARE_PROVIDER_SITE_OTHER): Payer: Medicare HMO

## 2013-12-29 ENCOUNTER — Telehealth: Payer: Self-pay | Admitting: *Deleted

## 2013-12-29 DIAGNOSIS — E785 Hyperlipidemia, unspecified: Secondary | ICD-10-CM

## 2013-12-29 DIAGNOSIS — D518 Other vitamin B12 deficiency anemias: Secondary | ICD-10-CM

## 2013-12-29 DIAGNOSIS — D519 Vitamin B12 deficiency anemia, unspecified: Secondary | ICD-10-CM

## 2013-12-29 DIAGNOSIS — E119 Type 2 diabetes mellitus without complications: Secondary | ICD-10-CM

## 2013-12-29 DIAGNOSIS — E559 Vitamin D deficiency, unspecified: Secondary | ICD-10-CM

## 2013-12-29 DIAGNOSIS — M810 Age-related osteoporosis without current pathological fracture: Secondary | ICD-10-CM

## 2013-12-29 DIAGNOSIS — I10 Essential (primary) hypertension: Secondary | ICD-10-CM

## 2013-12-29 LAB — COMPREHENSIVE METABOLIC PANEL
ALT: 9 U/L (ref 0–35)
AST: 14 U/L (ref 0–37)
Albumin: 3.8 g/dL (ref 3.5–5.2)
Alkaline Phosphatase: 58 U/L (ref 39–117)
BUN: 16 mg/dL (ref 6–23)
CO2: 29 mEq/L (ref 19–32)
Calcium: 9.3 mg/dL (ref 8.4–10.5)
Chloride: 101 mEq/L (ref 96–112)
Creatinine, Ser: 0.8 mg/dL (ref 0.4–1.2)
GFR: 74.42 mL/min (ref >60.00)
Glucose, Bld: 107 mg/dL — ABNORMAL HIGH (ref 70–99)
POTASSIUM: 4.5 meq/L (ref 3.5–5.1)
SODIUM: 137 meq/L (ref 135–145)
Total Bilirubin: 0.8 mg/dL (ref 0.2–1.2)
Total Protein: 7.3 g/dL (ref 6.0–8.3)

## 2013-12-29 LAB — CBC WITH DIFFERENTIAL/PLATELET
BASOS ABS: 0 10*3/uL (ref 0.0–0.1)
BASOS PCT: 0.5 % (ref 0.0–3.0)
EOS PCT: 1.4 % (ref 0.0–5.0)
Eosinophils Absolute: 0.1 10*3/uL (ref 0.0–0.7)
HEMATOCRIT: 34.5 % — AB (ref 36.0–46.0)
HEMOGLOBIN: 11.3 g/dL — AB (ref 12.0–15.0)
LYMPHS ABS: 3.6 10*3/uL (ref 0.7–4.0)
LYMPHS PCT: 39 % (ref 12.0–46.0)
MCHC: 32.8 g/dL (ref 30.0–36.0)
MCV: 86.6 fl (ref 78.0–100.0)
MONOS PCT: 6.3 % (ref 3.0–12.0)
Monocytes Absolute: 0.6 10*3/uL (ref 0.1–1.0)
Neutro Abs: 4.9 10*3/uL (ref 1.4–7.7)
Neutrophils Relative %: 52.8 % (ref 43.0–77.0)
Platelets: 278 10*3/uL (ref 150.0–400.0)
RBC: 3.98 Mil/uL (ref 3.87–5.11)
RDW: 13.2 % (ref 11.5–15.5)
WBC: 9.2 10*3/uL (ref 4.0–10.5)

## 2013-12-29 LAB — LIPID PANEL
Cholesterol: 134 mg/dL (ref 0–200)
HDL: 52.1 mg/dL (ref >39.00)
LDL Cholesterol: 56 mg/dL (ref 0–99)
NonHDL: 81.9
Total CHOL/HDL Ratio: 3
Triglycerides: 132 mg/dL (ref 0.0–149.0)
VLDL: 26.4 mg/dL (ref 0.0–40.0)

## 2013-12-29 LAB — TSH: TSH: 4.88 u[IU]/mL — ABNORMAL HIGH (ref 0.35–4.50)

## 2013-12-29 LAB — VITAMIN D 25 HYDROXY (VIT D DEFICIENCY, FRACTURES): VITD: 29.88 ng/mL — ABNORMAL LOW (ref 30.00–100.00)

## 2013-12-29 NOTE — Telephone Encounter (Signed)
What labs and dx?  

## 2013-12-31 ENCOUNTER — Encounter: Payer: Self-pay | Admitting: Internal Medicine

## 2013-12-31 ENCOUNTER — Ambulatory Visit (INDEPENDENT_AMBULATORY_CARE_PROVIDER_SITE_OTHER): Payer: Medicare HMO | Admitting: Internal Medicine

## 2013-12-31 VITALS — BP 180/80 | HR 77 | Temp 97.8°F | Resp 14 | Ht 67.0 in | Wt 122.0 lb

## 2013-12-31 DIAGNOSIS — E119 Type 2 diabetes mellitus without complications: Secondary | ICD-10-CM

## 2013-12-31 DIAGNOSIS — F320 Major depressive disorder, single episode, mild: Secondary | ICD-10-CM

## 2013-12-31 DIAGNOSIS — D509 Iron deficiency anemia, unspecified: Secondary | ICD-10-CM

## 2013-12-31 DIAGNOSIS — R29898 Other symptoms and signs involving the musculoskeletal system: Secondary | ICD-10-CM

## 2013-12-31 DIAGNOSIS — R1310 Dysphagia, unspecified: Secondary | ICD-10-CM

## 2013-12-31 LAB — HM DIABETES FOOT EXAM: HM Diabetic Foot Exam: NORMAL

## 2013-12-31 MED ORDER — GLUCOSE BLOOD VI STRP: ORAL_STRIP | Status: DC

## 2013-12-31 NOTE — Assessment & Plan Note (Addendum)
Still having occasional dysphaga for solids and liquids.  She had an episode of wheezing associated with last episode of eating which lasted a  Minute or two. Swallow evaluation was ordered 3 months ago but never scheduled by office and patient did not call to follow up.  It has been reordered today . Likely achalasia since it is not consistent. No history of etoh or tobacco abuse adn no history of GERD .

## 2013-12-31 NOTE — Progress Notes (Signed)
Pre-visit discussion using our clinic review tool. No additional management support is needed unless otherwise documented below in the visit note.  
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
## 2013-12-31 NOTE — Patient Instructions (Signed)
Your diabetes remains under excellent control currently. .Please return in 3 months for follow up on diabetes and make sure you are seeing your eye doctor at least once a year.   Please continue to take a baby aspirin daily  Please start taking a famotidine 20 mg daily to prevent reflux (available OTC)  We will get the esophageal study ordered ., call in one week if you do not hear from [place]

## 2014-01-03 NOTE — Assessment & Plan Note (Signed)
Well controlled. She is taking an aspirin,  Statin and ACE Inhibitor. Foot exam is normal. She has had episodes of malaise  And has used glucometer to rule out hypoglycemia .   Lab Results  Component Value Date   HGBA1C 6.4 10/05/2013   Lab Results  Component Value Date   MICROALBUR 9.7* 04/08/2013

## 2014-01-03 NOTE — Assessment & Plan Note (Signed)
Improved with SSRI.  Weight  Loss has stopped and she is feeling better. No changes today

## 2014-01-03 NOTE — Assessment & Plan Note (Addendum)
Improved  Once multiple contributors including B12 deficiency, depression and sedentary lifestyle were all addressed.

## 2014-01-03 NOTE — Assessment & Plan Note (Signed)
hgb is stable, but  she is having dysphagia.  Esophageal study, iron studies need to be repeated.   Lab Results  Component Value Date   IRON 60 10/05/2013   TIBC 349 10/05/2013   FERRITIN 30.5 01/06/2013   Lab Results  Component Value Date   WBC 9.2 12/29/2013   HGB 11.3* 12/29/2013   HCT 34.5* 12/29/2013   MCV 86.6 12/29/2013   PLT 278.0 12/29/2013

## 2014-01-04 ENCOUNTER — Other Ambulatory Visit: Payer: Self-pay | Admitting: Internal Medicine

## 2014-01-08 ENCOUNTER — Ambulatory Visit: Payer: Self-pay | Admitting: Internal Medicine

## 2014-01-12 ENCOUNTER — Telehealth: Payer: Self-pay | Admitting: Internal Medicine

## 2014-01-12 DIAGNOSIS — R1310 Dysphagia, unspecified: Secondary | ICD-10-CM

## 2014-01-12 NOTE — Telephone Encounter (Signed)
Her swallow evaluation did not reveal any strictures, hiatal hernia or masses.  She may be having esophageal spasm, which can be managed with eating more slowly and alternating food with sips of water.

## 2014-01-12 NOTE — Assessment & Plan Note (Signed)
Her swallow evaluation did not reveal any strictures, hiatal hernia or masses.  She may be having esophageal spasm, which can be managed with eating more slowly and alternating food with sips of water.

## 2014-01-13 ENCOUNTER — Other Ambulatory Visit: Payer: Self-pay | Admitting: Internal Medicine

## 2014-01-13 NOTE — Telephone Encounter (Signed)
Please advise refills 

## 2014-01-13 NOTE — Telephone Encounter (Signed)
Patient notified and voiced understanding.

## 2014-01-13 NOTE — Telephone Encounter (Signed)
Left message for patient to call office.  

## 2014-01-15 NOTE — Telephone Encounter (Signed)
Rx faxed to pharmacy  

## 2014-01-15 NOTE — Telephone Encounter (Signed)
Ok to refill,  printed rx  

## 2014-01-18 ENCOUNTER — Encounter: Payer: Self-pay | Admitting: *Deleted

## 2014-01-21 ENCOUNTER — Encounter: Payer: Self-pay | Admitting: *Deleted

## 2014-01-26 ENCOUNTER — Other Ambulatory Visit: Payer: Self-pay | Admitting: Internal Medicine

## 2014-02-09 ENCOUNTER — Other Ambulatory Visit: Payer: Self-pay | Admitting: Internal Medicine

## 2014-02-09 ENCOUNTER — Ambulatory Visit (INDEPENDENT_AMBULATORY_CARE_PROVIDER_SITE_OTHER): Payer: Medicare HMO

## 2014-02-09 DIAGNOSIS — Z23 Encounter for immunization: Secondary | ICD-10-CM

## 2014-02-13 ENCOUNTER — Ambulatory Visit: Payer: Medicare HMO

## 2014-03-17 ENCOUNTER — Other Ambulatory Visit: Payer: Self-pay | Admitting: Internal Medicine

## 2014-03-19 ENCOUNTER — Encounter: Payer: Self-pay | Admitting: Internal Medicine

## 2014-04-06 ENCOUNTER — Telehealth: Payer: Self-pay | Admitting: *Deleted

## 2014-04-06 DIAGNOSIS — E119 Type 2 diabetes mellitus without complications: Secondary | ICD-10-CM

## 2014-04-06 DIAGNOSIS — E559 Vitamin D deficiency, unspecified: Secondary | ICD-10-CM

## 2014-04-06 DIAGNOSIS — I10 Essential (primary) hypertension: Secondary | ICD-10-CM

## 2014-04-06 DIAGNOSIS — D519 Vitamin B12 deficiency anemia, unspecified: Secondary | ICD-10-CM

## 2014-04-06 NOTE — Telephone Encounter (Signed)
Pt is coming in tomorrow what labs and dx?  

## 2014-04-07 ENCOUNTER — Other Ambulatory Visit (INDEPENDENT_AMBULATORY_CARE_PROVIDER_SITE_OTHER): Payer: Medicare HMO

## 2014-04-07 ENCOUNTER — Encounter: Payer: Self-pay | Admitting: Internal Medicine

## 2014-04-07 ENCOUNTER — Ambulatory Visit (INDEPENDENT_AMBULATORY_CARE_PROVIDER_SITE_OTHER): Payer: Medicare HMO | Admitting: Internal Medicine

## 2014-04-07 VITALS — BP 158/80 | HR 81 | Temp 96.9°F | Resp 12 | Ht 67.0 in | Wt 118.8 lb

## 2014-04-07 DIAGNOSIS — I10 Essential (primary) hypertension: Secondary | ICD-10-CM

## 2014-04-07 DIAGNOSIS — E119 Type 2 diabetes mellitus without complications: Secondary | ICD-10-CM

## 2014-04-07 DIAGNOSIS — F320 Major depressive disorder, single episode, mild: Secondary | ICD-10-CM

## 2014-04-07 DIAGNOSIS — E785 Hyperlipidemia, unspecified: Secondary | ICD-10-CM

## 2014-04-07 DIAGNOSIS — D519 Vitamin B12 deficiency anemia, unspecified: Secondary | ICD-10-CM

## 2014-04-07 LAB — LIPID PANEL
CHOL/HDL RATIO: 3
Cholesterol: 160 mg/dL (ref 0–200)
HDL: 62 mg/dL (ref >39.00)
LDL CALC: 70 mg/dL (ref 0–99)
NonHDL: 98
Triglycerides: 141 mg/dL (ref 0.0–149.0)
VLDL: 28.2 mg/dL (ref 0.0–40.0)

## 2014-04-07 LAB — MICROALBUMIN / CREATININE URINE RATIO
CREATININE, U: 132.3 mg/dL
MICROALB UR: 3.3 mg/dL — AB (ref 0.0–1.9)
MICROALB/CREAT RATIO: 2.5 mg/g (ref 0.0–30.0)

## 2014-04-07 LAB — CBC WITH DIFFERENTIAL/PLATELET
BASOS PCT: 0.5 % (ref 0.0–3.0)
Basophils Absolute: 0 10*3/uL (ref 0.0–0.1)
Eosinophils Absolute: 0.2 10*3/uL (ref 0.0–0.7)
Eosinophils Relative: 1.5 % (ref 0.0–5.0)
HCT: 35.1 % — ABNORMAL LOW (ref 36.0–46.0)
Hemoglobin: 11.7 g/dL — ABNORMAL LOW (ref 12.0–15.0)
LYMPHS PCT: 31.7 % (ref 12.0–46.0)
Lymphs Abs: 3.3 10*3/uL (ref 0.7–4.0)
MCHC: 33.3 g/dL (ref 30.0–36.0)
MCV: 86 fl (ref 78.0–100.0)
MONOS PCT: 5.1 % (ref 3.0–12.0)
Monocytes Absolute: 0.5 10*3/uL (ref 0.1–1.0)
NEUTROS PCT: 61.2 % (ref 43.0–77.0)
Neutro Abs: 6.3 10*3/uL (ref 1.4–7.7)
PLATELETS: 254 10*3/uL (ref 150.0–400.0)
RBC: 4.08 Mil/uL (ref 3.87–5.11)
RDW: 13.8 % (ref 11.5–15.5)
WBC: 10.3 10*3/uL (ref 4.0–10.5)

## 2014-04-07 LAB — COMPREHENSIVE METABOLIC PANEL
ALT: 11 U/L (ref 0–35)
AST: 15 U/L (ref 0–37)
Albumin: 4.5 g/dL (ref 3.5–5.2)
Alkaline Phosphatase: 55 U/L (ref 39–117)
BUN: 35 mg/dL — AB (ref 6–23)
CALCIUM: 9.5 mg/dL (ref 8.4–10.5)
CO2: 26 meq/L (ref 19–32)
Chloride: 100 mEq/L (ref 96–112)
Creatinine, Ser: 1 mg/dL (ref 0.4–1.2)
GFR: 56.01 mL/min — ABNORMAL LOW (ref >60.00)
Glucose, Bld: 135 mg/dL — ABNORMAL HIGH (ref 70–99)
Potassium: 4.2 mEq/L (ref 3.5–5.1)
Sodium: 135 mEq/L (ref 135–145)
Total Bilirubin: 0.5 mg/dL (ref 0.2–1.2)
Total Protein: 8.1 g/dL (ref 6.0–8.3)

## 2014-04-07 LAB — HEMOGLOBIN A1C: HEMOGLOBIN A1C: 7.1 % — AB (ref 4.6–6.5)

## 2014-04-07 MED ORDER — BLOOD PRESSURE MONITOR KIT: PACK | Status: DC

## 2014-04-07 MED ORDER — MIRTAZAPINE 15 MG PO TABS: 15.0000 mg | ORAL_TABLET | Freq: Every day | ORAL | Status: DC
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
## 2014-04-07 NOTE — Patient Instructions (Signed)
I am starting you on Remeron to help your appetite and energy  Start with 1/2 tablet daily at beditme for the first then week,  Then increase to full tablet every night  Depression Depression refers to feeling sad, low, down in the dumps, blue, gloomy, or empty. In general, there are two kinds of depression: 1. Normal sadness or normal grief. This kind of depression is one that we all feel from time to time after upsetting life experiences, such as the loss of a job or the ending of a relationship. This kind of depression is considered normal, is short lived, and resolves within a few days to 2 weeks. Depression experienced after the loss of a loved one (bereavement) often lasts longer than 2 weeks but normally gets better with time. 2. Clinical depression. This kind of depression lasts longer than normal sadness or normal grief or interferes with your ability to function at home, at work, and in school. It also interferes with your personal relationships. It affects almost every aspect of your life. Clinical depression is an illness. Symptoms of depression can also be caused by conditions other than those mentioned above, such as:  Physical illness. Some physical illnesses, including underactive thyroid gland (hypothyroidism), severe anemia, specific types of cancer, diabetes, uncontrolled seizures, heart and lung problems, strokes, and chronic pain are commonly associated with symptoms of depression.  Side effects of some prescription medicine. In some people, certain types of medicine can cause symptoms of depression.  Substance abuse. Abuse of alcohol and illicit drugs can cause symptoms of depression. SYMPTOMS Symptoms of normal sadness and normal grief include the following:  Feeling sad or crying for short periods of time.  Not caring about anything (apathy).  Difficulty sleeping or sleeping too much.  No longer able to enjoy the things you used to enjoy.  Desire to be by oneself all  the time (social isolation).  Lack of energy or motivation.  Difficulty concentrating or remembering.  Change in appetite or weight.  Restlessness or agitation. Symptoms of clinical depression include the same symptoms of normal sadness or normal grief and also the following symptoms:  Feeling sad or crying all the time.  Feelings of guilt or worthlessness.  Feelings of hopelessness or helplessness.  Thoughts of suicide or the desire to harm yourself (suicidal ideation).  Loss of touch with reality (psychotic symptoms). Seeing or hearing things that are not real (hallucinations) or having false beliefs about your life or the people around you (delusions and paranoia). DIAGNOSIS  The diagnosis of clinical depression is usually based on how bad the symptoms are and how long they have lasted. Your health care provider will also ask you questions about your medical history and substance use to find out if physical illness, use of prescription medicine, or substance abuse is causing your depression. Your health care provider may also order blood tests. TREATMENT  Often, normal sadness and normal grief do not require treatment. However, sometimes antidepressant medicine is given for bereavement to ease the depressive symptoms until they resolve. The treatment for clinical depression depends on how bad the symptoms are but often includes antidepressant medicine, counseling with a mental health professional, or both. Your health care provider will help to determine what treatment is best for you. Depression caused by physical illness usually goes away with appropriate medical treatment of the illness. If prescription medicine is causing depression, talk with your health care provider about stopping the medicine, decreasing the dose, or changing to another medicine.  Depression caused by the abuse of alcohol or illicit drugs goes away when you stop using these substances. Some adults need  professional help in order to stop drinking or using drugs. SEEK IMMEDIATE MEDICAL CARE IF:  You have thoughts about hurting yourself or others.  You lose touch with reality (have psychotic symptoms).  You are taking medicine for depression and have a serious side effect. FOR MORE INFORMATION  National [employer] on Mental Illness: www.nami.[employer]: https://carter.com/ Document Released: 04/13/2000 Document Revised: 08/31/2013 Document Reviewed: 07/16/2011 [facility] Patient Information 2015 [place], [place]. This information is not intended to replace advice given to you by your health care provider. Make sure you discuss any questions you have with your health care provider.

## 2014-04-07 NOTE — Progress Notes (Signed)
Pre-visit discussion using our clinic review tool. No additional management support is needed unless otherwise documented below in the visit note.  

## 2014-04-08 NOTE — Assessment & Plan Note (Signed)
Well controlled on current regimen. Renal function stable, no changes today.  Lab Results  Component Value Date   NA 135 04/07/2014   K 4.2 04/07/2014   CL 100 04/07/2014   CO2 26 04/07/2014   Lab Results  Component Value Date   CREATININE 1.0 04/07/2014

## 2014-04-08 NOTE — Assessment & Plan Note (Signed)
With anhedonia and wt loss,  Adding remeron at bedtime

## 2014-04-08 NOTE — Assessment & Plan Note (Signed)
Well controlled on current statin therapy.   Liver enzymes are normal , no changes today. Lab Results  Component Value Date   CHOL 160 04/07/2014   HDL 62.00 04/07/2014   LDLCALC 70 04/07/2014   TRIG 141.0 04/07/2014   CHOLHDL 3 04/07/2014   Lab Results  Component Value Date   ALT 11 04/07/2014   AST 15 04/07/2014   ALKPHOS 55 04/07/2014   BILITOT 0.5 04/07/2014

## 2014-04-08 NOTE — Assessment & Plan Note (Signed)
Well controlled. She is taking an aspirin,  Statin and ACE Inhibitor for mild proteinuria. . Foot exam is normal.  .   Lab Results  Component Value Date   HGBA1C 7.1* 04/07/2014   Lab Results  Component Value Date   MICROALBUR 3.3* 04/07/2014

## 2014-04-12 ENCOUNTER — Other Ambulatory Visit: Payer: Self-pay | Admitting: Internal Medicine

## 2014-04-14 ENCOUNTER — Ambulatory Visit: Payer: Medicare HMO | Admitting: Internal Medicine

## 2014-04-26 ENCOUNTER — Other Ambulatory Visit: Payer: Self-pay | Admitting: Internal Medicine

## 2014-05-04 ENCOUNTER — Other Ambulatory Visit: Payer: Self-pay | Admitting: Internal Medicine

## 2014-05-18 ENCOUNTER — Other Ambulatory Visit: Payer: Self-pay | Admitting: Internal Medicine

## 2014-07-08 ENCOUNTER — Telehealth: Payer: Self-pay | Admitting: *Deleted

## 2014-07-08 ENCOUNTER — Other Ambulatory Visit (INDEPENDENT_AMBULATORY_CARE_PROVIDER_SITE_OTHER): Payer: Medicare HMO

## 2014-07-08 ENCOUNTER — Ambulatory Visit (INDEPENDENT_AMBULATORY_CARE_PROVIDER_SITE_OTHER): Payer: Medicare HMO | Admitting: Internal Medicine

## 2014-07-08 ENCOUNTER — Encounter: Payer: Self-pay | Admitting: Internal Medicine

## 2014-07-08 VITALS — BP 160/72 | HR 72 | Temp 98.0°F | Resp 16 | Ht 67.0 in | Wt 128.0 lb

## 2014-07-08 DIAGNOSIS — E119 Type 2 diabetes mellitus without complications: Secondary | ICD-10-CM

## 2014-07-08 DIAGNOSIS — E559 Vitamin D deficiency, unspecified: Secondary | ICD-10-CM

## 2014-07-08 DIAGNOSIS — R634 Abnormal weight loss: Secondary | ICD-10-CM

## 2014-07-08 DIAGNOSIS — F320 Major depressive disorder, single episode, mild: Secondary | ICD-10-CM

## 2014-07-08 DIAGNOSIS — I10 Essential (primary) hypertension: Secondary | ICD-10-CM

## 2014-07-08 MED ORDER — PROMETHAZINE HCL 12.5 MG PO TABS: 12.5000 mg | ORAL_TABLET | Freq: Three times a day (TID) | ORAL | Status: DC | PRN

## 2014-07-08 MED ORDER — ENALAPRIL MALEATE 20 MG PO TABS: 20.0000 mg | ORAL_TABLET | Freq: Two times a day (BID) | ORAL | Status: DC

## 2014-07-08 NOTE — Progress Notes (Signed)
Pre-visit discussion using our clinic review tool. No additional management support is needed unless otherwise documented below in the visit note.  

## 2014-07-08 NOTE — Telephone Encounter (Signed)
What labs and dX?  

## 2014-07-08 NOTE — Patient Instructions (Signed)
REDUCE THE CITALOPRAM TO 1/2 TABLET DAILY FOR ONE WEEK,  THEN 1/2 TABLET EVERY OTHER DAY FOR ONE WEEK  THEN STOP   CONTINUE THE REMERON BECAUSE IS WORKING!!  YOU HAVE GAINED 10 LBS     WE HAVE INCREASED THE ENALAPRIL TO TWO TIMES DAILY FOR BLOOD PRESSURE  WE'LL SEE YOU AGAIN IN 3 MONTHS

## 2014-07-09 LAB — COMPREHENSIVE METABOLIC PANEL
ALBUMIN: 4.1 g/dL (ref 3.5–5.2)
ALT: 10 U/L (ref 0–35)
AST: 15 U/L (ref 0–37)
Alkaline Phosphatase: 54 U/L (ref 39–117)
BUN: 29 mg/dL — ABNORMAL HIGH (ref 6–23)
CALCIUM: 9.5 mg/dL (ref 8.4–10.5)
CHLORIDE: 106 meq/L (ref 96–112)
CO2: 26 mEq/L (ref 19–32)
Creatinine, Ser: 0.87 mg/dL (ref 0.40–1.20)
GFR: 66.49 mL/min (ref >60.00)
Glucose, Bld: 129 mg/dL — ABNORMAL HIGH (ref 70–99)
POTASSIUM: 4.3 meq/L (ref 3.5–5.1)
SODIUM: 138 meq/L (ref 135–145)
Total Bilirubin: 0.3 mg/dL (ref 0.2–1.2)
Total Protein: 7.4 g/dL (ref 6.0–8.3)

## 2014-07-09 LAB — HEMOGLOBIN A1C: Hgb A1c MFr Bld: 6.7 % — ABNORMAL HIGH (ref 4.6–6.5)

## 2014-07-10 ENCOUNTER — Encounter: Payer: Self-pay | Admitting: Internal Medicine
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
## 2014-07-10 NOTE — Assessment & Plan Note (Signed)
Improved with initiation of remeron. No changes today  Wt Readings from Last 3 Encounters:  07/08/14 128 lb (58.06 kg)  04/07/14 118 lb 12 oz (53.865 kg)  12/31/13 122 lb (55.339 kg)

## 2014-07-11 ENCOUNTER — Encounter: Payer: Self-pay | Admitting: Internal Medicine

## 2014-07-11 NOTE — Assessment & Plan Note (Addendum)
Improving with remeron. Affect better, less withdrawn, and appetite improved. Begin citalopram taper.

## 2014-07-11 NOTE — Assessment & Plan Note (Signed)
Well controlled. She is taking an aspirin,  Statin and ACE Inhibitor for mild proteinuria. . Foot exam is normal.   Follow up in 6 months.  Lab Results  Component Value Date   HGBA1C 6.7* 07/08/2014   Lab Results  Component Value Date   MICROALBUR 3.3* 04/07/2014

## 2014-07-11 NOTE — Assessment & Plan Note (Signed)
Increasing enalapril dose today for better control.

## 2014-07-20 ENCOUNTER — Other Ambulatory Visit: Payer: Self-pay | Admitting: Internal Medicine

## 2014-07-20 NOTE — Telephone Encounter (Signed)
Ok to refill,  Authorized in epic 

## 2014-07-20 NOTE — Telephone Encounter (Signed)
Last OV 3.10.16.  Please advise refill

## 2014-07-28 ENCOUNTER — Other Ambulatory Visit: Payer: Self-pay | Admitting: Internal Medicine

## 2014-07-28 NOTE — Telephone Encounter (Signed)
Last refill 2.29.16, last OV 3.10.16.  Please advise refill

## 2014-08-30 ENCOUNTER — Other Ambulatory Visit: Payer: Self-pay | Admitting: Internal Medicine

## 2014-09-13 ENCOUNTER — Telehealth: Payer: Self-pay | Admitting: *Deleted

## 2014-09-13 MED ORDER — IBUPROFEN 600 MG PO TABS: 600.0000 mg | ORAL_TABLET | Freq: Three times a day (TID) | ORAL | Status: DC | PRN

## 2014-09-13 NOTE — Telephone Encounter (Signed)
Fax received from [facility], requesting refill on Ibuprofen 600 mg, 1 tablet tid prn. #90. Not on current med list, ok to refill? Last visit 07/08/14

## 2014-09-13 NOTE — Telephone Encounter (Signed)
Yes ok to refill ibuprofen.  #90/minth  1 refill

## 2014-09-17 ENCOUNTER — Other Ambulatory Visit: Payer: Self-pay | Admitting: Internal Medicine

## 2014-09-17 NOTE — Telephone Encounter (Signed)
Ok to refill,  printed rx  

## 2014-09-17 NOTE — Telephone Encounter (Signed)
Last OV 3.10.16, last refill 9.18.15.  Next OV 6.13.16.  Please advise refill

## 2014-09-20 NOTE — Telephone Encounter (Signed)
Script faxed.

## 2014-10-07 ENCOUNTER — Ambulatory Visit: Payer: Medicare HMO | Admitting: Internal Medicine

## 2014-10-11 ENCOUNTER — Ambulatory Visit (INDEPENDENT_AMBULATORY_CARE_PROVIDER_SITE_OTHER): Payer: Medicare HMO | Admitting: Internal Medicine

## 2014-10-11 ENCOUNTER — Encounter: Payer: Self-pay | Admitting: Internal Medicine

## 2014-10-11 VITALS — BP 138/70 | HR 79 | Temp 97.6°F | Resp 16 | Ht 67.0 in | Wt 127.8 lb

## 2014-10-11 DIAGNOSIS — D509 Iron deficiency anemia, unspecified: Secondary | ICD-10-CM

## 2014-10-11 DIAGNOSIS — R296 Repeated falls: Secondary | ICD-10-CM

## 2014-10-11 DIAGNOSIS — D519 Vitamin B12 deficiency anemia, unspecified: Secondary | ICD-10-CM

## 2014-10-11 DIAGNOSIS — I10 Essential (primary) hypertension: Secondary | ICD-10-CM

## 2014-10-11 DIAGNOSIS — E785 Hyperlipidemia, unspecified: Secondary | ICD-10-CM

## 2014-10-11 DIAGNOSIS — R634 Abnormal weight loss: Secondary | ICD-10-CM

## 2014-10-11 DIAGNOSIS — E559 Vitamin D deficiency, unspecified: Secondary | ICD-10-CM | POA: Diagnosis not present

## 2014-10-11 DIAGNOSIS — E119 Type 2 diabetes mellitus without complications: Secondary | ICD-10-CM | POA: Diagnosis not present

## 2014-10-11 DIAGNOSIS — F320 Major depressive disorder, single episode, mild: Secondary | ICD-10-CM

## 2014-10-11 DIAGNOSIS — M810 Age-related osteoporosis without current pathological fracture: Secondary | ICD-10-CM

## 2014-10-11 LAB — CBC WITH DIFFERENTIAL/PLATELET
BASOS ABS: 0.1 10*3/uL (ref 0.0–0.1)
Basophils Relative: 0.7 % (ref 0.0–3.0)
EOS ABS: 0.1 10*3/uL (ref 0.0–0.7)
Eosinophils Relative: 1.7 % (ref 0.0–5.0)
HEMATOCRIT: 35.9 % — AB (ref 36.0–46.0)
HEMOGLOBIN: 11.7 g/dL — AB (ref 12.0–15.0)
LYMPHS ABS: 2.7 10*3/uL (ref 0.7–4.0)
Lymphocytes Relative: 32.6 % (ref 12.0–46.0)
MCHC: 32.7 g/dL (ref 30.0–36.0)
MCV: 86.8 fl (ref 78.0–100.0)
MONO ABS: 0.5 10*3/uL (ref 0.1–1.0)
Monocytes Relative: 5.5 % (ref 3.0–12.0)
NEUTROS PCT: 59.5 % (ref 43.0–77.0)
Neutro Abs: 5 10*3/uL (ref 1.4–7.7)
PLATELETS: 267 10*3/uL (ref 150.0–400.0)
RBC: 4.13 Mil/uL (ref 3.87–5.11)
RDW: 14 % (ref 11.5–15.5)
WBC: 8.3 10*3/uL (ref 4.0–10.5)

## 2014-10-11 LAB — LIPID PANEL
Cholesterol: 148 mg/dL (ref 0–200)
HDL: 51.6 mg/dL (ref >39.00)
LDL Cholesterol: 64 mg/dL (ref 0–99)
NONHDL: 96.4
Total CHOL/HDL Ratio: 3
Triglycerides: 163 mg/dL — ABNORMAL HIGH (ref 0.0–149.0)
VLDL: 32.6 mg/dL (ref 0.0–40.0)

## 2014-10-11 LAB — COMPREHENSIVE METABOLIC PANEL
ALBUMIN: 4.5 g/dL (ref 3.5–5.2)
ALK PHOS: 61 U/L (ref 39–117)
ALT: 16 U/L (ref 0–35)
AST: 19 U/L (ref 0–37)
BUN: 21 mg/dL (ref 6–23)
CO2: 26 mEq/L (ref 19–32)
Calcium: 10 mg/dL (ref 8.4–10.5)
Chloride: 102 mEq/L (ref 96–112)
Creatinine, Ser: 0.81 mg/dL (ref 0.40–1.20)
GFR: 72.16 mL/min (ref >60.00)
Glucose, Bld: 151 mg/dL — ABNORMAL HIGH (ref 70–99)
POTASSIUM: 4.6 meq/L (ref 3.5–5.1)
SODIUM: 137 meq/L (ref 135–145)
TOTAL PROTEIN: 8 g/dL (ref 6.0–8.3)
Total Bilirubin: 0.5 mg/dL (ref 0.2–1.2)

## 2014-10-11 LAB — MICROALBUMIN / CREATININE URINE RATIO
Creatinine,U: 94.6 mg/dL
MICROALB UR: 5.3 mg/dL — AB (ref 0.0–1.9)
Microalb Creat Ratio: 5.6 mg/g (ref 0.0–30.0)

## 2014-10-11 LAB — HEMOGLOBIN A1C: Hgb A1c MFr Bld: 7.2 % — ABNORMAL HIGH (ref 4.6–6.5)

## 2014-10-11 MED ORDER — PRAVASTATIN SODIUM 40 MG PO TABS: 40.0000 mg | ORAL_TABLET | Freq: Every day | ORAL | Status: DC

## 2014-10-11 MED ORDER — ALPRAZOLAM 0.5 MG PO TABS: ORAL_TABLET | ORAL | Status: DC

## 2014-10-11 MED ORDER — MIRTAZAPINE 15 MG PO TABS: 15.0000 mg | ORAL_TABLET | Freq: Every day | ORAL | Status: DC

## 2014-10-11 MED ORDER — ENALAPRIL MALEATE 20 MG PO TABS: 20.0000 mg | ORAL_TABLET | Freq: Two times a day (BID) | ORAL | Status: DC

## 2014-10-11 MED ORDER — AMLODIPINE BESYLATE 5 MG PO TABS: 5.0000 mg | ORAL_TABLET | Freq: Every day | ORAL | Status: DC

## 2014-10-11 NOTE — Assessment & Plan Note (Signed)
None in a year.  Leg weakenss as cause,  Advised to use stationery bike daily

## 2014-10-11 NOTE — Patient Instructions (Addendum)
We are  reducing the remeron  To 7.5  At bedtime.  If the knee weakness persists,  Stop it completely  And we will start a different anti depressant  I am ordering a Bone Density Test  Please try to get 15 minutes of sunshine daily in the morning!

## 2014-10-11 NOTE — Progress Notes (Signed)
Subjective:  Patient ID: Cheyenne Torres, female    DOB: Jul 28, 1931  Age: 81 y.o. MRN: [id number]  CC: The primary encounter diagnosis was Essential hypertension. Diagnoses of Diabetes mellitus type 2, diet-controlled, Postmenopausal osteoporosis, B12 deficiency anemia, Vitamin D deficiency, Iron deficiency anemia, Recurrent falls, Depression, major, single episode, mild, Unintentional weight loss, and Hyperlipidemia were also pertinent to this visit.  HPI [name] presents for follow up on Type 2 DM,  Depression and hypertension,  Her medications were adjusted at last visit,  She is tolerating the medication changes without side effects.  Home BPs have been under 700 systolic 17% of the time.   Blood sugars have been < 130 fasting and < 170 post prandially.  She is sleeping better but notes leg weakness and instability with nocturnal wake ups for voids,  She has not fallen but fear she may fall on the way to the bathroom.  She is taking 15 mg remeron at night.   Outpatient Prescriptions Prior to Visit  Medication Sig Dispense Refill  . alendronate (FOSAMAX) 70 MG tablet Take 1 tablet (70 mg total) by mouth every 7 (seven) days. Take with a full glass of water on an empty stomach. 4 tablet 3  . aspirin 81 MG tablet Take 81 mg by mouth daily.      . Blood Pressure Monitor KIT Monitor BP once daily 1 each 0  . cholecalciferol (VITAMIN D) 1000 UNITS tablet Take 1,000 Units by mouth daily.      . citalopram (CELEXA) 20 MG tablet TAKE ONE TABLET BY MOUTH ONCE DAILY 90 tablet 0  . clobetasol ointment (TEMOVATE) 0.05 % APPLY TOPICALLY TO AFFECTED AREA TWICE DAILY 30 g 3  . Cranberry (CRANBERRY CONCENTRATE) 500 MG CAPS Take 1 capsule (500 mg total) by mouth daily. 90 each 0  . docusate sodium (COLACE) 100 MG capsule Take 100 mg by mouth daily.      . fluticasone (FLONASE) 50 MCG/ACT nasal spray Place 2 sprays into the nose daily. 16 g 6  . glucose blood (FREESTYLE LITE) test strip Use once  daily to  check blood sugars   250.00  90 day supply 100 each 3  . ibuprofen (ADVIL,MOTRIN) 600 MG tablet Take 1 tablet (600 mg total) by mouth 3 (three) times daily as needed. 90 tablet 1  . L-Lysine HCl 1000 MG TABS Take by mouth as needed.    . NON FORMULARY Fiber cap one daily     . Polyethyl Glycol-Propyl Glycol (SYSTANE ULTRA) 0.4-0.3 % SOLN Apply to eye daily.    . promethazine (PHENERGAN) 12.5 MG tablet Take 1 tablet (12.5 mg total) by mouth every 8 (eight) hours as needed. for nausea 20 tablet 0  . tobramycin-dexamethasone (TOBRADEX) ophthalmic solution Place 1 drop into the left eye every 4 (four) hours while awake.    . vitamin B-12 (CYANOCOBALAMIN) 500 MCG tablet Take 500 mcg by mouth daily.    Marland Kitchen ALPRAZolam (XANAX) 0.5 MG tablet TAKE ONE TABLET BY MOUTH AT BEDTIME AS NEEDED FOR  SLEEP 30 tablet 0  . amLODipine (NORVASC) 5 MG tablet TAKE ONE TABLET BY MOUTH ONCE DAILY 90 tablet 1  . enalapril (VASOTEC) 20 MG tablet Take 1 tablet (20 mg total) by mouth 2 (two) times daily. NOTE DOSE CHANGE TO TWICE DAILY 180 tablet 1  . mirtazapine (REMERON) 15 MG tablet Take 1 tablet (15 mg total) by mouth at bedtime. 30 tablet 5  . pravastatin (PRAVACHOL) 40 MG tablet TAKE  ONE TABLET BY MOUTH ONCE DAILY 90 tablet 0   No facility-administered medications prior to visit.    Review of Systems;  Patient denies headache, fevers, malaise, unintentional weight loss, skin rash, eye pain, sinus congestion and sinus pain, sore throat, dysphagia,  hemoptysis , cough, dyspnea, wheezing, chest pain, palpitations, orthopnea, edema, abdominal pain, nausea, melena, diarrhea, constipation, flank pain, dysuria, hematuria, urinary  Frequency, nocturia, numbness, tingling, seizures,  Focal weakness, Loss of consciousness,  Tremor, insomnia, depression, anxiety, and suicidal ideation.      Objective:  BP 138/70 mmHg  Pulse 79  Temp(Src) 97.6 F (36.4 C) (Oral)  Resp 16  Ht 5' 7"  (1.702 m)  Wt 127 lb 12 oz  (57.947 kg)  BMI 20.00 kg/m2  SpO2 97%  BP Readings from Last 3 Encounters:  10/11/14 138/70  07/08/14 160/72  04/07/14 158/80    Wt Readings from Last 3 Encounters:  10/11/14 127 lb 12 oz (57.947 kg)  07/08/14 128 lb (58.06 kg)  04/07/14 118 lb 12 oz (53.865 kg)    General appearance: alert, cooperative and appears stated age Ears: normal TM's and external ear canals both ears Throat: lips, mucosa, and tongue normal; teeth and gums normal Neck: no adenopathy, no carotid bruit, supple, symmetrical, trachea midline and thyroid not enlarged, symmetric, no tenderness/mass/nodules Back: symmetric, no curvature. ROM normal. No CVA tenderness. Lungs: clear to auscultation bilaterally Heart: regular rate and rhythm, S1, S2 normal, no murmur, click, rub or gallop Abdomen: soft, non-tender; bowel sounds normal; no masses,  no organomegaly Pulses: 2+ and symmetric Skin: Skin color, texture, turgor normal. No rashes or lesions Lymph nodes: Cervical, supraclavicular, and axillary nodes normal.  Lab Results  Component Value Date   HGBA1C 7.2* 10/11/2014   HGBA1C 6.7* 07/08/2014   HGBA1C 7.1* 04/07/2014    Lab Results  Component Value Date   CREATININE 0.81 10/11/2014   CREATININE 0.87 07/08/2014   CREATININE 1.0 04/07/2014    Lab Results  Component Value Date   WBC 8.3 10/11/2014   HGB 11.7* 10/11/2014   HCT 35.9* 10/11/2014   PLT 267.0 10/11/2014   GLUCOSE 151* 10/11/2014   CHOL 148 10/11/2014   TRIG 163.0* 10/11/2014   HDL 51.60 10/11/2014   LDLCALC 64 10/11/2014   ALT 16 10/11/2014   AST 19 10/11/2014   NA 137 10/11/2014   K 4.6 10/11/2014   CL 102 10/11/2014   CREATININE 0.81 10/11/2014   BUN 21 10/11/2014   CO2 26 10/11/2014   TSH 4.88* 12/29/2013   HGBA1C 7.2* 10/11/2014   MICROALBUR 5.3* 10/11/2014    No results found.  Assessment & Plan:   Problem List Items Addressed This Visit    Hypertension - Primary    Well controlled on current regimen. Renal  function stable, no changes today.  Lab Results  Component Value Date   CREATININE 0.81 10/11/2014   Lab Results  Component Value Date   NA 137 10/11/2014   K 4.6 10/11/2014   CL 102 10/11/2014   CO2 26 10/11/2014         Relevant Medications   amLODipine (NORVASC) 5 MG tablet   pravastatin (PRAVACHOL) 40 MG tablet   enalapril (VASOTEC) 20 MG tablet   Other Relevant Orders   Comprehensive metabolic panel (Completed)   Hyperlipidemia    Well controlled on current statin therapy.   Liver enzymes are normal , no changes today. Lab Results  Component Value Date   CHOL 148 10/11/2014   HDL 51.60 10/11/2014  LDLCALC 64 10/11/2014   TRIG 163.0* 10/11/2014   CHOLHDL 3 10/11/2014   Lab Results  Component Value Date   ALT 16 10/11/2014   AST 19 10/11/2014   ALKPHOS 61 10/11/2014   BILITOT 0.5 10/11/2014            Relevant Medications   amLODipine (NORVASC) 5 MG tablet   pravastatin (PRAVACHOL) 40 MG tablet   enalapril (VASOTEC) 20 MG tablet   Depression, major, single episode, mild    Improved with remeron,  Bu side effects necessitate lowering the dose to 7.5 mg .  Will add lexapro if needed       Relevant Medications   mirtazapine (REMERON) 15 MG tablet   ALPRAZolam (XANAX) 0.5 MG tablet   Unintentional weight loss    Weight is stable.   I have reviewed her diet and recommended that she increase her protein and fat intake while monitoring her carbohydrates.   Wt Readings from Last 3 Encounters:  10/11/14 127 lb 12 oz (57.947 kg)  07/08/14 128 lb (58.06 kg)  04/07/14 118 lb 12 oz (53.865 kg)        Diabetes mellitus type 2, diet-controlled    A1c has risen slightly, but given her history of weight loss and anorexia,  No meds are advised.  She is taking an aspirin,  Statin and ACE Inhibitor for mild proteinuria. . Foot exam is normal.   Follow up in 3 months.  Lab Results  Component Value Date   HGBA1C 7.2* 10/11/2014   Lab Results  Component Value  Date   MICROALBUR 5.3* 10/11/2014                    Relevant Medications   pravastatin (PRAVACHOL) 40 MG tablet   enalapril (VASOTEC) 20 MG tablet   Other Relevant Orders   Hemoglobin A1c (Completed)   Lipid panel (Completed)   Microalbumin / creatinine urine ratio (Completed)   Recurrent falls    None in a year.  Leg weakenss as cause,  Advised to use stationery bike daily       Vitamin D deficiency   Relevant Orders   Vit D  25 hydroxy (rtn osteoporosis monitoring) (Completed)   Iron deficiency anemia   Relevant Orders   CBC with Differential/Platelet (Completed)   Postmenopausal osteoporosis   Relevant Orders   Vit D  25 hydroxy (rtn osteoporosis monitoring) (Completed)   DG Bone Density   B12 deficiency anemia     A total of 25 minutes of face to face time was spent with patient more than half of which was spent in counselling about the above mentioned conditions  and coordination of care   I have changed Ms. Semrad's amLODipine and pravastatin. I am also having her maintain her docusate sodium, NON FORMULARY, cholecalciferol, aspirin, alendronate, fluticasone, vitamin B-12, Polyethyl Glycol-Propyl Glycol, L-Lysine HCl, Cranberry, glucose blood, tobramycin-dexamethasone, Blood Pressure Monitor, citalopram, promethazine, clobetasol ointment, ibuprofen, mirtazapine, ALPRAZolam, and enalapril.  Meds ordered this encounter  Medications  . amLODipine (NORVASC) 5 MG tablet    Sig: Take 1 tablet (5 mg total) by mouth daily.    Dispense:  90 tablet    Refill:  1  . mirtazapine (REMERON) 15 MG tablet    Sig: Take 1 tablet (15 mg total) by mouth at bedtime.    Dispense:  30 tablet    Refill:  5  . ALPRAZolam (XANAX) 0.5 MG tablet    Sig: TAKE ONE TABLET BY MOUTH AT  BEDTIME AS NEEDED FOR  SLEEP    Dispense:  30 tablet    Refill:  5  . pravastatin (PRAVACHOL) 40 MG tablet    Sig: Take 1 tablet (40 mg total) by mouth daily.    Dispense:  90 tablet    Refill:  1    . enalapril (VASOTEC) 20 MG tablet    Sig: Take 1 tablet (20 mg total) by mouth 2 (two) times daily. NOTE DOSE CHANGE TO TWICE DAILY    Dispense:  180 tablet    Refill:  1    Medications Discontinued During This Encounter  Medication Reason  . amLODipine (NORVASC) 5 MG tablet Reorder  . mirtazapine (REMERON) 15 MG tablet Reorder  . ALPRAZolam (XANAX) 0.5 MG tablet Reorder  . pravastatin (PRAVACHOL) 40 MG tablet Reorder  . enalapril (VASOTEC) 20 MG tablet Reorder    Follow-up: Return in about 3 months (around 01/11/2015) for follow up diabetes.   Crecencio Mc, MD

## 2014-10-12 LAB — VITAMIN D 25 HYDROXY (VIT D DEFICIENCY, FRACTURES): VITD: 21.36 ng/mL — ABNORMAL LOW (ref 30.00–100.00)

## 2014-10-12 NOTE — Assessment & Plan Note (Signed)
Well controlled on current statin therapy.   Liver enzymes are normal , no changes today. Lab Results  Component Value Date   CHOL 148 10/11/2014   HDL 51.60 10/11/2014   LDLCALC 64 10/11/2014   TRIG 163.0* 10/11/2014   CHOLHDL 3 10/11/2014   Lab Results  Component Value Date   ALT 16 10/11/2014   AST 19 10/11/2014   ALKPHOS 61 10/11/2014   BILITOT 0.5 10/11/2014

## 2014-10-12 NOTE — Assessment & Plan Note (Signed)
Well controlled on current regimen. Renal function stable, no changes today.  Lab Results  Component Value Date   CREATININE 0.81 10/11/2014   Lab Results  Component Value Date   NA 137 10/11/2014   K 4.6 10/11/2014   CL 102 10/11/2014   CO2 26 10/11/2014

## 2014-10-12 NOTE — Assessment & Plan Note (Signed)
Improved with remeron,  Bu side effects necessitate lowering the dose to 7.5 mg .  Will add lexapro if needed

## 2014-10-12 NOTE — Assessment & Plan Note (Addendum)
A1c has risen slightly, but given her history of weight loss and anorexia,  No meds are advised.  She is taking an aspirin,  Statin and ACE Inhibitor for mild proteinuria. . Foot exam is normal.   Follow up in 3 months.  Lab Results  Component Value Date   HGBA1C 7.2* 10/11/2014   Lab Results  Component Value Date   MICROALBUR 5.3* 10/11/2014

## 2014-10-12 NOTE — Assessment & Plan Note (Signed)
Weight is stable.   I have reviewed her diet and recommended that she increase her protein and fat intake while monitoring her carbohydrates.   Wt Readings from Last 3 Encounters:  10/11/14 127 lb 12 oz (57.947 kg)  07/08/14 128 lb (58.06 kg)  04/07/14 118 lb 12 oz (53.865 kg)

## 2014-10-14 ENCOUNTER — Other Ambulatory Visit: Payer: Self-pay | Admitting: Internal Medicine

## 2014-10-14 ENCOUNTER — Encounter: Payer: Self-pay | Admitting: Internal Medicine

## 2014-10-14 MED ORDER — ERGOCALCIFEROL 1.25 MG (50000 UT) PO CAPS: 50000.0000 [IU] | ORAL_CAPSULE | ORAL | Status: DC

## 2014-10-19 ENCOUNTER — Telehealth: Payer: Self-pay | Admitting: *Deleted

## 2014-10-19 NOTE — Telephone Encounter (Signed)
Fax from [place], [place] for Eszopiclone approved through 04/30/15

## 2014-11-05 ENCOUNTER — Ambulatory Visit (INDEPENDENT_AMBULATORY_CARE_PROVIDER_SITE_OTHER): Payer: Medicare HMO | Admitting: Internal Medicine

## 2014-11-05 ENCOUNTER — Ambulatory Visit
Admission: RE | Admit: 2014-11-05 | Discharge: 2014-11-05 | Disposition: A | Discharge status: Home / Self Care | Payer: Medicare HMO | Source: Ambulatory Visit | Attending: Internal Medicine | Admitting: Internal Medicine

## 2014-11-05 ENCOUNTER — Encounter: Payer: Self-pay | Admitting: Internal Medicine

## 2014-11-05 VITALS — BP 136/74 | HR 86 | Temp 98.0°F | Resp 14 | Ht 67.0 in | Wt 128.1 lb

## 2014-11-05 DIAGNOSIS — M25511 Pain in right shoulder: Secondary | ICD-10-CM

## 2014-11-05 DIAGNOSIS — M19011 Primary osteoarthritis, right shoulder: Secondary | ICD-10-CM | POA: Diagnosis present

## 2014-11-05 NOTE — Progress Notes (Signed)
Pre-visit discussion using our clinic review tool. No additional management support is needed unless otherwise documented below in the visit note.  

## 2014-11-05 NOTE — Patient Instructions (Addendum)
You may have a bone spur that is causing your right shoulder pain  The x rays will help determine that  You can continue using ibuprofen 2 or 3 times daily  You can add acetominophen (tylenol) 1-2 tablets twice daily ALONG WITH THE IBUPROFEN  When you return in September we may try putting a steroid injection in the shoulder

## 2014-11-05 NOTE — Progress Notes (Signed)
Subjective:  Patient ID: Cheyenne Torres, female    DOB: 1931/09/07  Age: 81 y.o. MRN: [id number]  CC: The encounter diagnosis was Pain in joint, shoulder region, right.  HPI Cheyenne Torres presents for shoulder pain aggravated by movement and cold air.  Shehas a history of two prior falls first one resulting in blunt force injury to shoulder one year ago,  but no evaluation was sought at the time. The second inury resulted in bruising on top of shoulder whit hit the railing during the fall but not the ground.  Started bothering her 2-3 months ago,  Intermittent  But has been more frequent and more severe,  Pain shoots down right arm,  Notes pain in the right trapezius muscle at times but not temporally assoicated with the arm pain . The Pain is brought on by abduction or forward flexion above the horizoin with active ROM,  Passive ROM is better,  The pain occurs in the anterior part of upper arm .  Using ibuprofen 600 mg twice daily mas, which helps  No prior orthopedic evaluation but prefers to see [facility] if needed.   Outpatient Prescriptions Prior to Visit  Medication Sig Dispense Refill  . alendronate (FOSAMAX) 70 MG tablet Take 1 tablet (70 mg total) by mouth every 7 (seven) days. Take with a full glass of water on an empty stomach. 4 tablet 3  . ALPRAZolam (XANAX) 0.5 MG tablet TAKE ONE TABLET BY MOUTH AT BEDTIME AS NEEDED FOR  SLEEP 30 tablet 5  . amLODipine (NORVASC) 5 MG tablet Take 1 tablet (5 mg total) by mouth daily. 90 tablet 1  . aspirin 81 MG tablet Take 81 mg by mouth daily.      . Blood Pressure Monitor KIT Monitor BP once daily 1 each 0  . cholecalciferol (VITAMIN D) 1000 UNITS tablet Take 1,000 Units by mouth daily.      . citalopram (CELEXA) 20 MG tablet TAKE ONE TABLET BY MOUTH ONCE DAILY 90 tablet 0  . clobetasol ointment (TEMOVATE) 0.05 % APPLY TOPICALLY TO AFFECTED AREA TWICE DAILY 30 g 3  . Cranberry (CRANBERRY CONCENTRATE) 500 MG CAPS Take 1 capsule (500 mg  total) by mouth daily. 90 each 0  . docusate sodium (COLACE) 100 MG capsule Take 100 mg by mouth daily.      . enalapril (VASOTEC) 20 MG tablet Take 1 tablet (20 mg total) by mouth 2 (two) times daily. NOTE DOSE CHANGE TO TWICE DAILY 180 tablet 1  . ergocalciferol (DRISDOL) 50000 UNITS capsule Take 1 capsule (50,000 Units total) by mouth once a week. 12 capsule 0  . fluticasone (FLONASE) 50 MCG/ACT nasal spray Place 2 sprays into the nose daily. 16 g 6  . glucose blood (FREESTYLE LITE) test strip Use once daily to  check blood sugars   250.00  90 day supply 100 each 3  . ibuprofen (ADVIL,MOTRIN) 600 MG tablet Take 1 tablet (600 mg total) by mouth 3 (three) times daily as needed. 90 tablet 1  . L-Lysine HCl 1000 MG TABS Take by mouth as needed.    . mirtazapine (REMERON) 15 MG tablet Take 1 tablet (15 mg total) by mouth at bedtime. 30 tablet 5  . NON FORMULARY Fiber cap one daily     . Polyethyl Glycol-Propyl Glycol (SYSTANE ULTRA) 0.4-0.3 % SOLN Apply to eye daily.    . pravastatin (PRAVACHOL) 40 MG tablet Take 1 tablet (40 mg total) by mouth daily. 90 tablet 1  .  promethazine (PHENERGAN) 12.5 MG tablet Take 1 tablet (12.5 mg total) by mouth every 8 (eight) hours as needed. for nausea 20 tablet 0  . tobramycin-dexamethasone (TOBRADEX) ophthalmic solution Place 1 drop into the left eye every 4 (four) hours while awake.    . vitamin B-12 (CYANOCOBALAMIN) 500 MCG tablet Take 500 mcg by mouth daily.     No facility-administered medications prior to visit.    Review of Systems;  Patient denies headache, fevers, malaise, unintentional weight loss, skin rash, eye pain, sinus congestion and sinus pain, sore throat, dysphagia,  hemoptysis , cough, dyspnea, wheezing, chest pain, palpitations, orthopnea, edema, abdominal pain, nausea, melena, diarrhea, constipation, flank pain, dysuria, hematuria, urinary  Frequency, nocturia, numbness, tingling, seizures,  Focal weakness, Loss of consciousness,  Tremor,  insomnia, depression, anxiety, and suicidal ideation.      Objective:  BP 136/74 mmHg  Pulse 86  Temp(Src) 98 F (36.7 C) (Oral)  Resp 14  Ht 5' 7"  (1.702 m)  Wt 128 lb 2 oz (58.117 kg)  BMI 20.06 kg/m2  SpO2 98%  BP Readings from Last 3 Encounters:  11/05/14 136/74  10/11/14 138/70  07/08/14 160/72    Wt Readings from Last 3 Encounters:  11/05/14 128 lb 2 oz (58.117 kg)  10/11/14 127 lb 12 oz (57.947 kg)  07/08/14 128 lb (58.06 kg)    General appearance: alert, cooperative and appears stated age Ears: normal TM's and external ear canals both ears Throat: lips, mucosa, and tongue normal; teeth and gums normal Neck: no adenopathy, no carotid bruit, supple, symmetrical, trachea midline and thyroid not enlarged, symmetric, no tenderness/mass/nodules Back: symmetric, no curvature. ROM normal. No CVA tenderness. Lungs: clear to auscultation bilaterally Heart: regular rate and rhythm, S1, S2 normal, no murmur, click, rub or gallop Abdomen: soft, non-tender; bowel sounds normal; no masses,  no organomegaly Pulses: 2+ and symmetric MSK: right arm ROm limited to less than 180 degrees abduction and flexion  secondary to pain,  240 drefress passively  Skin: Skin color, texture, turgor normal. No rashes or lesions Lymph nodes: Cervical, supraclavicular, and axillary nodes normal.  Lab Results  Component Value Date   HGBA1C 7.2* 10/11/2014   HGBA1C 6.7* 07/08/2014   HGBA1C 7.1* 04/07/2014    Lab Results  Component Value Date   CREATININE 0.81 10/11/2014   CREATININE 0.87 07/08/2014   CREATININE 1.0 04/07/2014    Lab Results  Component Value Date   WBC 8.3 10/11/2014   HGB 11.7* 10/11/2014   HCT 35.9* 10/11/2014   PLT 267.0 10/11/2014   GLUCOSE 151* 10/11/2014   CHOL 148 10/11/2014   TRIG 163.0* 10/11/2014   HDL 51.60 10/11/2014   LDLCALC 64 10/11/2014   ALT 16 10/11/2014   AST 19 10/11/2014   NA 137 10/11/2014   K 4.6 10/11/2014   CL 102 10/11/2014    CREATININE 0.81 10/11/2014   BUN 21 10/11/2014   CO2 26 10/11/2014   TSH 4.88* 12/29/2013   HGBA1C 7.2* 10/11/2014   MICROALBUR 5.3* 10/11/2014    No results found.  Assessment & Plan:   Problem List Items Addressed This Visit      Unprioritized   Pain in joint, shoulder region - Primary    She was sent for plain films of right shoulder which suggested mild degenerative changes, no acute changes and no large bone spurs -\. She does not want orthopedic referral yet,  But would like to have a shoulder injection at follow up in one month.  Relevant Orders   DG Shoulder Right (Completed)      I am having Ms. Umholtz maintain her docusate sodium, NON FORMULARY, cholecalciferol, aspirin, alendronate, fluticasone, vitamin B-12, Polyethyl Glycol-Propyl Glycol, L-Lysine HCl, Cranberry, glucose blood, tobramycin-dexamethasone, Blood Pressure Monitor, citalopram, promethazine, clobetasol ointment, ibuprofen, amLODipine, mirtazapine, ALPRAZolam, pravastatin, enalapril, and ergocalciferol.  No orders of the defined types were placed in this encounter.    There are no discontinued medications.  Follow-up: Return in about 2 months (around 01/06/2015) for follow up diabetes.   Crecencio Mc, MD

## 2014-11-06 ENCOUNTER — Encounter: Payer: Self-pay | Admitting: Internal Medicine

## 2014-11-06 DIAGNOSIS — M25519 Pain in unspecified shoulder: Secondary | ICD-10-CM | POA: Insufficient documentation

## 2014-11-06 NOTE — Assessment & Plan Note (Signed)
She was sent for plain films of right shoulder which suggested mild degenerative changes, no acute changes and no large bone spurs -\. She does not want orthopedic referral yet,  But would like to have a shoulder injection at follow up in one month.

## 2014-12-07 ENCOUNTER — Other Ambulatory Visit: Payer: Self-pay | Admitting: Internal Medicine

## 2015-01-12 ENCOUNTER — Encounter: Payer: Self-pay | Admitting: Internal Medicine

## 2015-01-12 ENCOUNTER — Ambulatory Visit (INDEPENDENT_AMBULATORY_CARE_PROVIDER_SITE_OTHER): Payer: Medicare HMO | Admitting: Internal Medicine

## 2015-01-12 ENCOUNTER — Ambulatory Visit: Payer: Medicare HMO

## 2015-01-12 ENCOUNTER — Encounter: Payer: Self-pay | Admitting: *Deleted

## 2015-01-12 ENCOUNTER — Other Ambulatory Visit (INDEPENDENT_AMBULATORY_CARE_PROVIDER_SITE_OTHER): Payer: Medicare HMO

## 2015-01-12 VITALS — BP 148/78 | HR 87 | Temp 97.9°F | Resp 14 | Ht 67.0 in | Wt 128.2 lb

## 2015-01-12 DIAGNOSIS — R634 Abnormal weight loss: Secondary | ICD-10-CM

## 2015-01-12 DIAGNOSIS — I10 Essential (primary) hypertension: Secondary | ICD-10-CM | POA: Diagnosis not present

## 2015-01-12 DIAGNOSIS — D519 Vitamin B12 deficiency anemia, unspecified: Secondary | ICD-10-CM | POA: Diagnosis not present

## 2015-01-12 DIAGNOSIS — D509 Iron deficiency anemia, unspecified: Secondary | ICD-10-CM

## 2015-01-12 DIAGNOSIS — E785 Hyperlipidemia, unspecified: Secondary | ICD-10-CM | POA: Diagnosis not present

## 2015-01-12 DIAGNOSIS — E119 Type 2 diabetes mellitus without complications: Secondary | ICD-10-CM | POA: Diagnosis not present

## 2015-01-12 DIAGNOSIS — Z23 Encounter for immunization: Secondary | ICD-10-CM

## 2015-01-12 DIAGNOSIS — Z0000 Encounter for general adult medical examination without abnormal findings: Secondary | ICD-10-CM

## 2015-01-12 DIAGNOSIS — M25511 Pain in right shoulder: Secondary | ICD-10-CM

## 2015-01-12 DIAGNOSIS — R29898 Other symptoms and signs involving the musculoskeletal system: Secondary | ICD-10-CM

## 2015-01-12 LAB — COMPREHENSIVE METABOLIC PANEL
ALK PHOS: 62 U/L (ref 39–117)
ALT: 12 U/L (ref 0–35)
AST: 17 U/L (ref 0–37)
Albumin: 4.3 g/dL (ref 3.5–5.2)
BILIRUBIN TOTAL: 0.4 mg/dL (ref 0.2–1.2)
BUN: 26 mg/dL — AB (ref 6–23)
CO2: 24 mEq/L (ref 19–32)
CREATININE: 0.77 mg/dL (ref 0.40–1.20)
Calcium: 9.5 mg/dL (ref 8.4–10.5)
Chloride: 103 mEq/L (ref 96–112)
GFR: 76.45 mL/min (ref >60.00)
GLUCOSE: 146 mg/dL — AB (ref 70–99)
Potassium: 3.9 mEq/L (ref 3.5–5.1)
SODIUM: 140 meq/L (ref 135–145)
TOTAL PROTEIN: 7.8 g/dL (ref 6.0–8.3)

## 2015-01-12 LAB — LIPID PANEL
CHOL/HDL RATIO: 3
Cholesterol: 143 mg/dL (ref 0–200)
HDL: 51.2 mg/dL (ref >39.00)
LDL CALC: 55 mg/dL (ref 0–99)
NonHDL: 92.02
Triglycerides: 187 mg/dL — ABNORMAL HIGH (ref 0.0–149.0)
VLDL: 37.4 mg/dL (ref 0.0–40.0)

## 2015-01-12 LAB — IBC PANEL
Iron: 52 ug/dL (ref 42–145)
Saturation Ratios: 15 % — ABNORMAL LOW (ref 20.0–50.0)
Transferrin: 247 mg/dL (ref 212.0–360.0)

## 2015-01-12 LAB — LDL CHOLESTEROL, DIRECT: Direct LDL: 68 mg/dL

## 2015-01-12 LAB — VITAMIN B12: Vitamin B-12: >1500 pg/mL — ABNORMAL HIGH (ref 211–911)

## 2015-01-12 LAB — FERRITIN: FERRITIN: 35.2 ng/mL (ref 10.0–291.0)

## 2015-01-12 LAB — HEMOGLOBIN A1C: Hgb A1c MFr Bld: 7 % — ABNORMAL HIGH (ref 4.6–6.5)

## 2015-01-12 NOTE — Patient Instructions (Signed)
I want you to suspend the pravastatin for 3 months.  If your joint pain improves significantly,  We will NOT RESUME the medication when you return   You Still need to walk a total of 30 minutes every day  You received the flu vaccine today

## 2015-01-12 NOTE — Progress Notes (Signed)
Pre-visit discussion using our clinic review tool. No additional management support is needed unless otherwise documented below in the visit note.  

## 2015-01-12 NOTE — Progress Notes (Signed)
Subjective:  Patient ID: Cheyenne Torres, female    DOB: August 25, 1931  Age: 81 y.o. MRN: [id number]  CC: The primary encounter diagnosis was Essential hypertension. Diagnoses of Diabetes mellitus type 2, diet-controlled, Hyperlipidemia, Iron deficiency anemia, B12 deficiency anemia, Encounter for immunization, Routine general medical examination at a health care facility, Unintentional weight loss, Proximal leg weakness, and Pain in joint, shoulder region, right were also pertinent to this visit.  HPI Cheyenne Torres presents for  Follow up on depression, hypertension, hyperlipidemia and diet controlled DM.  She is accompanied by her son.  She continues to avoid daily exercise due to sedentary habits. She has not had any falls.  She is checking blood sugars once daily at variable times.  BS have been under 130 fasting and < 150 post prandially.  Denies any recent hypoglyemic events.  Taking her medications as directed. Following a carbohydrate modified diet 6 days per week. Denies numbness, burning and tingling of extremities. Appetite is good.     Outpatient Prescriptions Prior to Visit  Medication Sig Dispense Refill  . alendronate (FOSAMAX) 70 MG tablet Take 1 tablet (70 mg total) by mouth every 7 (seven) days. Take with a full glass of water on an empty stomach. 4 tablet 3  . ALPRAZolam (XANAX) 0.5 MG tablet TAKE ONE TABLET BY MOUTH AT BEDTIME AS NEEDED FOR  SLEEP 30 tablet 5  . amLODipine (NORVASC) 5 MG tablet Take 1 tablet (5 mg total) by mouth daily. 90 tablet 1  . aspirin 81 MG tablet Take 81 mg by mouth daily.      . Blood Pressure Monitor KIT Monitor BP once daily 1 each 0  . cholecalciferol (VITAMIN D) 1000 UNITS tablet Take 1,000 Units by mouth daily.      . citalopram (CELEXA) 20 MG tablet TAKE ONE TABLET BY MOUTH ONCE DAILY 90 tablet 0  . clobetasol ointment (TEMOVATE) 0.05 % APPLY TOPICALLY TO AFFECTED AREA TWICE DAILY 30 g 3  . Cranberry (CRANBERRY CONCENTRATE) 500 MG CAPS Take  1 capsule (500 mg total) by mouth daily. 90 each 0  . docusate sodium (COLACE) 100 MG capsule Take 100 mg by mouth daily.      . enalapril (VASOTEC) 20 MG tablet Take 1 tablet (20 mg total) by mouth 2 (two) times daily. NOTE DOSE CHANGE TO TWICE DAILY 180 tablet 1  . ergocalciferol (DRISDOL) 50000 UNITS capsule Take 1 capsule (50,000 Units total) by mouth once a week. 12 capsule 0  . fluticasone (FLONASE) 50 MCG/ACT nasal spray Place 2 sprays into the nose daily. 16 g 6  . glucose blood (FREESTYLE LITE) test strip Use once daily to  check blood sugars   250.00  90 day supply 100 each 3  . ibuprofen (ADVIL,MOTRIN) 600 MG tablet Take 1 tablet (600 mg total) by mouth 3 (three) times daily as needed. 90 tablet 1  . L-Lysine HCl 1000 MG TABS Take by mouth as needed.    . mirtazapine (REMERON) 15 MG tablet Take 1 tablet (15 mg total) by mouth at bedtime. 30 tablet 5  . NON FORMULARY Fiber cap one daily     . Polyethyl Glycol-Propyl Glycol (SYSTANE ULTRA) 0.4-0.3 % SOLN Apply to eye daily.    . pravastatin (PRAVACHOL) 40 MG tablet Take 1 tablet (40 mg total) by mouth daily. 90 tablet 1  . promethazine (PHENERGAN) 12.5 MG tablet Take 1 tablet (12.5 mg total) by mouth every 8 (eight) hours as needed. for nausea 20 tablet  0  . tobramycin-dexamethasone (TOBRADEX) ophthalmic solution Place 1 drop into the left eye every 4 (four) hours while awake.    . vitamin B-12 (CYANOCOBALAMIN) 500 MCG tablet Take 500 mcg by mouth daily.    Marland Kitchen alendronate (FOSAMAX) 70 MG tablet TAKE ONE TABLET BY MOUTH ONCE A WEEK IN THE MORNING WITH A FULL GLASS OF WATER, 30 MINUTES BEFORE A MEAL OR BEVERAGE. REMAIN UPRIGHT 4 tablet 6   No facility-administered medications prior to visit.    Review of Systems;  Patient denies headache, fevers, malaise, unintentional weight loss, skin rash, eye pain, sinus congestion and sinus pain, sore throat, dysphagia,  hemoptysis , cough, dyspnea, wheezing, chest pain, palpitations, orthopnea,  edema, abdominal pain, nausea, melena, diarrhea, constipation, flank pain, dysuria, hematuria, urinary  Frequency, nocturia, numbness, tingling, seizures,  Focal weakness, Loss of consciousness,  Tremor, insomnia, depression, anxiety, and suicidal ideation.      Objective:  BP 148/78 mmHg  Pulse 87  Temp(Src) 97.9 F (36.6 C) (Oral)  Resp 14  Ht 5' 7"  (1.702 m)  Wt 128 lb 4 oz (58.174 kg)  BMI 20.08 kg/m2  SpO2 98%  BP Readings from Last 3 Encounters:  01/12/15 148/78  11/05/14 136/74  10/11/14 138/70    Wt Readings from Last 3 Encounters:  01/12/15 128 lb 4 oz (58.174 kg)  11/05/14 128 lb 2 oz (58.117 kg)  10/11/14 127 lb 12 oz (57.947 kg)    General appearance: alert, cooperative and appears stated age Ears: normal TM's and external ear canals both ears Throat: lips, mucosa, and tongue normal; teeth and gums normal Neck: no adenopathy, no carotid bruit, supple, symmetrical, trachea midline and thyroid not enlarged, symmetric, no tenderness/mass/nodules Back: symmetric, no curvature. ROM normal. No CVA tenderness. Lungs: clear to auscultation bilaterally Heart: regular rate and rhythm, S1, S2 normal, no murmur, click, rub or gallop Abdomen: soft, non-tender; bowel sounds normal; no masses,  no organomegaly Pulses: 2+ and symmetric Skin: Skin color, texture, turgor normal. No rashes or lesions Lymph nodes: Cervical, supraclavicular, and axillary nodes normal.  Lab Results  Component Value Date   HGBA1C 7.0* 01/12/2015   HGBA1C 7.2* 10/11/2014   HGBA1C 6.7* 07/08/2014    Lab Results  Component Value Date   CREATININE 0.77 01/12/2015   CREATININE 0.81 10/11/2014   CREATININE 0.87 07/08/2014    Lab Results  Component Value Date   WBC 8.3 10/11/2014   HGB 11.7* 10/11/2014   HCT 35.9* 10/11/2014   PLT 267.0 10/11/2014   GLUCOSE 146* 01/12/2015   CHOL 143 01/12/2015   TRIG 187.0* 01/12/2015   HDL 51.20 01/12/2015   LDLDIRECT 68.0 01/12/2015   LDLCALC 55  01/12/2015   ALT 12 01/12/2015   AST 17 01/12/2015   NA 140 01/12/2015   K 3.9 01/12/2015   CL 103 01/12/2015   CREATININE 0.77 01/12/2015   BUN 26* 01/12/2015   CO2 24 01/12/2015   TSH 4.88* 12/29/2013   HGBA1C 7.0* 01/12/2015   MICROALBUR 5.3* 10/11/2014    Dg Shoulder Right  11/05/2014   CLINICAL DATA:  Fall 1 year ago.  Initial evaluation .  EXAM: RIGHT SHOULDER - 2+ VIEW  COMPARISON:  None.  FINDINGS: Acromioclavicular glenohumeral degenerative change. No acute bony joint abnormality identified. No evidence of fracture, dislocation, or separation.  IMPRESSION: No acute bony or joint abnormality identified. No evidence fracture or dislocation. Acromioclavicular and glenohumeral degenerative change present.   Electronically Signed   By: Marcello Moores  Register   On: 11/05/2014 13:37  Assessment & Plan:   Problem List Items Addressed This Visit      Unprioritized   Hypertension - Primary    Complicated by white coat hypertension.  Home BP machine is accurate and home readings are fine         Relevant Orders   Comprehensive metabolic panel (Completed)   Unintentional weight loss    Weight is stable.   I have reviewed her diet and recommended that she increase her protein and fat intake while monitoring her carbohydrates.   Wt Readings from Last 3 Encounters:  01/12/15 128 lb 4 oz (58.174 kg)  11/05/14 128 lb 2 oz (58.117 kg)  10/11/14 127 lb 12 oz (57.947 kg)          Diabetes mellitus type 2, diet-controlled    well-controlled on diet alone.  hemoglobin A1c has been consistently at or  less than 7.0 . Patient is up-to-date on eye exams and foot exam is normal today. Patient is tolerating aspirin, statin therapy for CAD risk reduction and on ACE/ARB for reduction in proteinuria.    Lab Results  Component Value Date   HGBA1C 7.0* 01/12/2015   Lab Results  Component Value Date   MICROALBUR 5.3* 10/11/2014                      Relevant Orders    Hemoglobin A1c (Completed)   Proximal leg weakness    She continues to avoid regular activity, suspension of statin for 3 moths to see if symptoms improve.       Routine general medical examination at a health care facility    Again urged to walk daily .  multiple contributors including B12 deficiency, depression and sedentary lifestyle were all addressed.         Pain in joint, shoulder region    Recent plain  films reviewed.  Advised to alter her environment since the pain is only reported with lateral reaches to her end table to reach her glass of water.       Hyperlipidemia   Relevant Orders   LDL cholesterol, direct (Completed)   Lipid panel (Completed)   Iron deficiency anemia   Relevant Orders   Ferritin (Completed)   Iron and TIBC   B12 deficiency anemia   Relevant Orders   Vitamin B12 (Completed)    Other Visit Diagnoses    Encounter for immunization           I am having Ms. Pallone maintain her docusate sodium, NON FORMULARY, cholecalciferol, aspirin, alendronate, fluticasone, vitamin B-12, Polyethyl Glycol-Propyl Glycol, L-Lysine HCl, Cranberry, glucose blood, tobramycin-dexamethasone, Blood Pressure Monitor, citalopram, promethazine, clobetasol ointment, ibuprofen, amLODipine, mirtazapine, ALPRAZolam, pravastatin, enalapril, and ergocalciferol.  No orders of the defined types were placed in this encounter.    Medications Discontinued During This Encounter  Medication Reason  . alendronate (FOSAMAX) 70 MG tablet Duplicate    Follow-up: Return in about 3 months (around 04/13/2015).   Crecencio Mc, MD

## 2015-01-13 ENCOUNTER — Encounter: Payer: Self-pay | Admitting: Internal Medicine

## 2015-01-13 NOTE — Assessment & Plan Note (Signed)
Weight is stable.   I have reviewed her diet and recommended that she increase her protein and fat intake while monitoring her carbohydrates.   Wt Readings from Last 3 Encounters:  01/12/15 128 lb 4 oz (58.174 kg)  11/05/14 128 lb 2 oz (58.117 kg)  10/11/14 127 lb 12 oz (57.947 kg)

## 2015-01-13 NOTE — Assessment & Plan Note (Signed)
Again urged to walk daily .  multiple contributors including B12 deficiency, depression and sedentary lifestyle were all addressed.

## 2015-01-13 NOTE — Assessment & Plan Note (Signed)
Recent plain  films reviewed.  Advised to alter her environment since the pain is only reported with lateral reaches to her end table to reach her glass of water.

## 2015-01-13 NOTE — Assessment & Plan Note (Signed)
Complicated by white coat hypertension.  Home BP machine is accurate and home readings are fine

## 2015-01-13 NOTE — Assessment & Plan Note (Signed)
well-controlled on diet alone.  hemoglobin A1c has been consistently at or  less than 7.0 . Patient is up-to-date on eye exams and foot exam is normal today. Patient is tolerating aspirin, statin therapy for CAD risk reduction and on ACE/ARB for reduction in proteinuria.    Lab Results  Component Value Date   HGBA1C 7.0* 01/12/2015   Lab Results  Component Value Date   MICROALBUR 5.3* 10/11/2014

## 2015-01-13 NOTE — Assessment & Plan Note (Signed)
She continues to avoid regular activity, suspension of statin for 3 moths to see if symptoms improve.

## 2015-02-11 ENCOUNTER — Encounter: Payer: Self-pay | Admitting: Internal Medicine

## 2015-03-28 ENCOUNTER — Other Ambulatory Visit: Payer: Self-pay | Admitting: Internal Medicine

## 2015-03-30 NOTE — Telephone Encounter (Signed)
Refill on Benzonatate. Is Dr.Tullo ok with this?

## 2015-04-12 ENCOUNTER — Other Ambulatory Visit: Payer: Self-pay | Admitting: Internal Medicine

## 2015-04-13 NOTE — Telephone Encounter (Signed)
refilled 

## 2015-04-18 ENCOUNTER — Telehealth: Payer: Self-pay

## 2015-04-18 ENCOUNTER — Ambulatory Visit
Admission: RE | Admit: 2015-04-18 | Discharge: 2015-04-18 | Disposition: A | Discharge status: Home / Self Care | Payer: Medicare HMO | Source: Ambulatory Visit | Attending: Internal Medicine | Admitting: Internal Medicine

## 2015-04-18 ENCOUNTER — Ambulatory Visit (INDEPENDENT_AMBULATORY_CARE_PROVIDER_SITE_OTHER): Payer: Medicare HMO | Admitting: Internal Medicine

## 2015-04-18 ENCOUNTER — Encounter: Payer: Self-pay | Admitting: Internal Medicine

## 2015-04-18 VITALS — BP 148/78 | HR 82 | Temp 97.4°F | Resp 12 | Ht 67.0 in | Wt 126.0 lb

## 2015-04-18 DIAGNOSIS — E1121 Type 2 diabetes mellitus with diabetic nephropathy: Secondary | ICD-10-CM

## 2015-04-18 DIAGNOSIS — F320 Major depressive disorder, single episode, mild: Secondary | ICD-10-CM

## 2015-04-18 DIAGNOSIS — E119 Type 2 diabetes mellitus without complications: Secondary | ICD-10-CM

## 2015-04-18 DIAGNOSIS — M810 Age-related osteoporosis without current pathological fracture: Secondary | ICD-10-CM | POA: Diagnosis not present

## 2015-04-18 DIAGNOSIS — E559 Vitamin D deficiency, unspecified: Secondary | ICD-10-CM

## 2015-04-18 DIAGNOSIS — G4700 Insomnia, unspecified: Secondary | ICD-10-CM

## 2015-04-18 DIAGNOSIS — M533 Sacrococcygeal disorders, not elsewhere classified: Secondary | ICD-10-CM

## 2015-04-18 DIAGNOSIS — I10 Essential (primary) hypertension: Secondary | ICD-10-CM

## 2015-04-18 DIAGNOSIS — E785 Hyperlipidemia, unspecified: Secondary | ICD-10-CM | POA: Diagnosis not present

## 2015-04-18 DIAGNOSIS — R29898 Other symptoms and signs involving the musculoskeletal system: Secondary | ICD-10-CM

## 2015-04-18 DIAGNOSIS — R296 Repeated falls: Secondary | ICD-10-CM

## 2015-04-18 DIAGNOSIS — Z23 Encounter for immunization: Secondary | ICD-10-CM

## 2015-04-18 LAB — LIPID PANEL
Cholesterol: 188 mg/dL (ref 0–200)
HDL: 43.5 mg/dL (ref >39.00)
NONHDL: 144.79
Total CHOL/HDL Ratio: 4
Triglycerides: 249 mg/dL — ABNORMAL HIGH (ref 0.0–149.0)
VLDL: 49.8 mg/dL — ABNORMAL HIGH (ref 0.0–40.0)

## 2015-04-18 LAB — COMPREHENSIVE METABOLIC PANEL
ALK PHOS: 82 U/L (ref 39–117)
ALT: 15 U/L (ref 0–35)
AST: 21 U/L (ref 0–37)
Albumin: 4.3 g/dL (ref 3.5–5.2)
BILIRUBIN TOTAL: 0.4 mg/dL (ref 0.2–1.2)
BUN: 20 mg/dL (ref 6–23)
CALCIUM: 9.9 mg/dL (ref 8.4–10.5)
CHLORIDE: 101 meq/L (ref 96–112)
CO2: 28 mEq/L (ref 19–32)
CREATININE: 0.79 mg/dL (ref 0.40–1.20)
GFR: 74.17 mL/min (ref >60.00)
Glucose, Bld: 145 mg/dL — ABNORMAL HIGH (ref 70–99)
Potassium: 4.2 mEq/L (ref 3.5–5.1)
Sodium: 139 mEq/L (ref 135–145)
TOTAL PROTEIN: 8.1 g/dL (ref 6.0–8.3)

## 2015-04-18 LAB — MICROALBUMIN / CREATININE URINE RATIO
CREATININE, U: 104.5 mg/dL
MICROALB UR: 5 mg/dL — AB (ref 0.0–1.9)
MICROALB/CREAT RATIO: 4.8 mg/g (ref 0.0–30.0)

## 2015-04-18 LAB — LDL CHOLESTEROL, DIRECT: LDL DIRECT: 102 mg/dL

## 2015-04-18 LAB — HEMOGLOBIN A1C: HEMOGLOBIN A1C: 7.4 % — AB (ref 4.6–6.5)

## 2015-04-18 MED ORDER — TEMAZEPAM 15 MG PO CAPS: 15.0000 mg | ORAL_CAPSULE | Freq: Every evening | ORAL | Status: DC | PRN

## 2015-04-18 NOTE — Patient Instructions (Addendum)
Premier Protein chocolate  Shakes instead of Ensure .  You may have fractured you tail bone.  I am sending you for x rays  Get a padded seat with memory foam to offload the tailbone area.  We are increasing your mirtazipine to 22.5 mg daily at bedtime to help your appetite     We are changing your sleep medication from alprazolam to restoril (temazepam ) .  Start with 15 mg one hour before bedtime    Your blood pressure is elevated.  If the home readings are over 130/80 most of the time we will need to adjust medications.

## 2015-04-18 NOTE — Telephone Encounter (Signed)
Patient called stating that she had a pneumonia shot today during her visit with Dr. Derrel Nip. Patient states that her arm has gotten very red and swollen, and is very painful.

## 2015-04-18 NOTE — Progress Notes (Signed)
Pre-visit discussion using our clinic review tool. No additional management support is needed unless otherwise documented below in the visit note.  

## 2015-04-18 NOTE — Telephone Encounter (Signed)
Notified patient of Dr. Lupita Dawn comments. Patient verbalized understanding.

## 2015-04-18 NOTE — Telephone Encounter (Signed)
Have her take motrin 400 mg every 6 hours,  And use ice packs for 15 minutes every couple of hours.  Have her son draw a line around the redness with a pen and look at it again tomorrow to see if it has spread.

## 2015-04-18 NOTE — Progress Notes (Signed)
Subjective:  Patient ID: Cheyenne Torres, female    DOB: [birth year]  Age: 81 y.o. MRN: [id number]  CC: The primary encounter diagnosis was Insomnia. Diagnoses of Coccygeal pain, Diabetes mellitus without complication ([place]), Diabetic nephropathy associated with type 2 diabetes mellitus ([place]), Need for prophylactic vaccination against Streptococcus pneumoniae (pneumococcus), Depression, major, single episode, mild ([place]), Diabetes mellitus type 2, diet-controlled ([place]), Vitamin D deficiency, Recurrent falls, Proximal leg weakness, Essential hypertension, and Coccygeal pain, acute were also pertinent to this visit.  HPI Cheyenne Torres presents for follow up on type 2 DM,  Hypertension, proximal leg weakness due to deconditionig, and depression.  Has lost 2 lbs due to decreased appetite.  Drinking Ensure an average of 4/week.  Cold weather making her less active.  Still taking Remeron 15 mg at night .   Had a fall in the bathroom two weeks ago and slipped on standing water that had splashed onto the floor.  Has a walk in shower.  Fell onto backside.  Has been having pain in the tailbone and left posterior hip for 2 weeks .  Son was not aware,  Husband was home when fall occurred.   checkng sugars once daily.  fastings < 140 and post prandials < 160 .  Walking in the house for exercise,  About 10-15 minutes daily. Eye exam scheduled for January .    Outpatient Prescriptions Prior to Visit  Medication Sig Dispense Refill  . alendronate (FOSAMAX) 70 MG tablet Take 1 tablet (70 mg total) by mouth every 7 (seven) days. Take with a full glass of water on an empty stomach. 4 tablet 3  . amLODipine (NORVASC) 5 MG tablet Take 1 tablet (5 mg total) by mouth daily. 90 tablet 1  . aspirin 81 MG tablet Take 81 mg by mouth daily.      . benzonatate (TESSALON) 100 MG capsule TAKE TWO CAPSULES BY MOUTH THREE TIMES DAILY AS NEEDED FOR COUGH 120 capsule 0  . Blood Pressure Monitor KIT Monitor BP once daily 1 each  0  . cholecalciferol (VITAMIN D) 1000 UNITS tablet Take 1,000 Units by mouth daily.      . citalopram (CELEXA) 20 MG tablet TAKE ONE TABLET BY MOUTH ONCE DAILY 90 tablet 0  . clobetasol ointment (TEMOVATE) 0.05 % APPLY TOPICALLY TO AFFECTED AREA TWICE DAILY 30 g 3  . Cranberry (CRANBERRY CONCENTRATE) 500 MG CAPS Take 1 capsule (500 mg total) by mouth daily. 90 each 0  . docusate sodium (COLACE) 100 MG capsule Take 100 mg by mouth daily.      . ergocalciferol (DRISDOL) 50000 UNITS capsule Take 1 capsule (50,000 Units total) by mouth once a week. 12 capsule 0  . fluticasone (FLONASE) 50 MCG/ACT nasal spray Place 2 sprays into the nose daily. 16 g 6  . glucose blood (FREESTYLE LITE) test strip Use once daily to  check blood sugars   250.00  90 day supply 100 each 3  . ibuprofen (ADVIL,MOTRIN) 600 MG tablet Take 1 tablet (600 mg total) by mouth 3 (three) times daily as needed. 90 tablet 1  . L-Lysine HCl 1000 MG TABS Take by mouth as needed.    . NON FORMULARY Fiber cap one daily     . Polyethyl Glycol-Propyl Glycol (SYSTANE ULTRA) 0.4-0.3 % SOLN Apply to eye daily.    . pravastatin (PRAVACHOL) 40 MG tablet Take 1 tablet (40 mg total) by mouth daily. 90 tablet 1  . promethazine (PHENERGAN) 12.5 MG tablet Take 1  tablet (12.5 mg total) by mouth every 8 (eight) hours as needed. for nausea 20 tablet 0  . tobramycin-dexamethasone (TOBRADEX) ophthalmic solution Place 1 drop into the left eye every 4 (four) hours while awake.    . vitamin B-12 (CYANOCOBALAMIN) 500 MCG tablet Take 500 mcg by mouth daily.    Marland Kitchen ALPRAZolam (XANAX) 0.5 MG tablet TAKE ONE TABLET BY MOUTH AT BEDTIME AS NEEDED FOR SLEEP 30 tablet 3  . enalapril (VASOTEC) 20 MG tablet Take 1 tablet (20 mg total) by mouth 2 (two) times daily. NOTE DOSE CHANGE TO TWICE DAILY 180 tablet 1  . mirtazapine (REMERON) 15 MG tablet Take 1 tablet (15 mg total) by mouth at bedtime. 30 tablet 5   No facility-administered medications prior to visit.     Review of Systems;  Patient denies headache, fevers, malaise,  skin rash, eye pain, sinus congestion and sinus pain, sore throat, dysphagia,  hemoptysis , cough, dyspnea, wheezing, chest pain, palpitations, orthopnea, edema, abdominal pain, nausea, melena, diarrhea, constipation, flank pain, dysuria, hematuria, urinary  Frequency, nocturia, numbness, tingling, seizures,  Focal weakness, Loss of consciousness,  and suicidal ideation.      Objective:  BP 148/78 mmHg  Pulse 82  Temp(Src) 97.4 F (36.3 C) (Oral)  Resp 12  Ht '5\' 7"'  (1.702 m)  Wt 126 lb (57.153 kg)  BMI 19.73 kg/m2  SpO2 98%  BP Readings from Last 3 Encounters:  04/18/15 148/78  01/12/15 148/78  11/05/14 136/74    Wt Readings from Last 3 Encounters:  04/18/15 126 lb (57.153 kg)  01/12/15 128 lb 4 oz (58.174 kg)  11/05/14 128 lb 2 oz (58.117 kg)    General appearance: alert, cooperative and appears stated age Ears: normal TM's and external ear canals both ears Throat: lips, mucosa, and tongue normal; teeth and gums normal Neck: no adenopathy, no carotid bruit, supple, symmetrical, trachea midline and thyroid not enlarged, symmetric, no tenderness/mass/nodules Back: symmetric, no curvature. ROM normal. No CVA tenderness. Lungs: clear to auscultation bilaterally Heart: regular rate and rhythm, S1, S2 normal, no murmur, click, rub or gallop Abdomen: soft, non-tender; bowel sounds normal; no masses,  no organomegaly Pulses: 2+ and symmetric Skin: Skin color, texture, turgor normal. No rashes or lesions Lymph nodes: Cervical, supraclavicular, and axillary nodes normal.  Lab Results  Component Value Date   HGBA1C 7.4* 04/18/2015   HGBA1C 7.0* 01/12/2015   HGBA1C 7.2* 10/11/2014    Lab Results  Component Value Date   CREATININE 0.79 04/18/2015   CREATININE 0.77 01/12/2015   CREATININE 0.81 10/11/2014    Lab Results  Component Value Date   WBC 8.3 10/11/2014   HGB 11.7* 10/11/2014   HCT 35.9*  10/11/2014   PLT 267.0 10/11/2014   GLUCOSE 145* 04/18/2015   CHOL 188 04/18/2015   TRIG 249.0* 04/18/2015   HDL 43.50 04/18/2015   LDLDIRECT 102.0 04/18/2015   LDLCALC 55 01/12/2015   ALT 15 04/18/2015   AST 21 04/18/2015   NA 139 04/18/2015   K 4.2 04/18/2015   CL 101 04/18/2015   CREATININE 0.79 04/18/2015   BUN 20 04/18/2015   CO2 28 04/18/2015   TSH 4.88* 12/29/2013   HGBA1C 7.4* 04/18/2015   MICROALBUR 5.0* 04/18/2015    Dg Shoulder Right  11/05/2014  CLINICAL DATA:  Fall 1 year ago.  Initial evaluation . EXAM: RIGHT SHOULDER - 2+ VIEW COMPARISON:  None. FINDINGS: Acromioclavicular glenohumeral degenerative change. No acute bony joint abnormality identified. No evidence of fracture, dislocation, or separation.  IMPRESSION: No acute bony or joint abnormality identified. No evidence fracture or dislocation. Acromioclavicular and glenohumeral degenerative change present. Electronically Signed   By: Marcello Moores  Register   On: 11/05/2014 13:37    Assessment & Plan:   Problem List Items Addressed This Visit    Hypertension    Given the development of proteinuria,  Will advise changing maximum dose of  enalapril to losartan with next refill.       Relevant Medications   losartan (COZAAR) 100 MG tablet   Depression, major, single episode, mild (HCC)    Given slight increased in depressive symptoms , discussed increasing dose of remeron to 22,5 mg andchanging alprazolam to temazepam given increased insomnia issues reported today.      Relevant Medications   mirtazapine (REMERON) 15 MG tablet   Diabetes mellitus with diabetic nephropathy (HCC)    Slight loss of control , may be due to drinking ensure,  Will recommend glucerna,  Premier Protein,  Atkins Advantage,  And Muscle Milk as alternatives that are lower in sugar.  Patient is up-to-date on eye exams and foot exam is normal today. Patient is tolerating aspirin, statin therapy for CAD risk reduction and on ACE/ARB for reduction in  proteinuria.    Lab Results  Component Value Date   HGBA1C 7.4* 04/18/2015   Lab Results  Component Value Date   MICROALBUR 5.0* 04/18/2015                        Relevant Medications   losartan (COZAAR) 100 MG tablet   Vitamin D deficiency    Recurrent, managed with weekly Drisdol.      Recurrent falls    Reviewed recent fall circumstances.  Son to assess shower/floor in bathroom to improve safety.  Plain fils of sacrum and pelvis ordered.       Proximal leg weakness    Encouraged to continue daily walking and practicing rising from seated position without using hands.       Coccygeal pain, acute    Plain films suggest she did fracture her tailbone during recent fall.  Padded cushion and pain management discussed.        Other Visit Diagnoses    Insomnia    -  Primary    Relevant Medications    temazepam (RESTORIL) 15 MG capsule    Coccygeal pain        Relevant Orders    DG Pelvis Comp Min 3V    DG Sacrum/Coccyx (Completed)    Diabetes mellitus without complication (HCC)        Relevant Medications    losartan (COZAAR) 100 MG tablet    Diabetic nephropathy associated with type 2 diabetes mellitus (HCC)        Relevant Medications    losartan (COZAAR) 100 MG tablet    Other Relevant Orders    Comprehensive metabolic panel (Completed)    Hemoglobin A1c (Completed)    LDL cholesterol, direct (Completed)    Lipid panel (Completed)    Microalbumin / creatinine urine ratio (Completed)    Need for prophylactic vaccination against Streptococcus pneumoniae (pneumococcus)        Relevant Orders    Pneumococcal polysaccharide vaccine 23-valent greater than or equal to 2yo subcutaneous/IM (Completed)     A total of 40 minutes was spent with patient more than half of which was spent in counseling patient on the above mentioned issues , reviewing and explaining recent labs and imaging studies done,  and coordination of care.  I have discontinued Ms. Sao's  enalapril and ALPRAZolam. I have also changed her mirtazapine. Additionally, I am having her start on temazepam and losartan. Lastly, I am having her maintain her docusate sodium, NON FORMULARY, cholecalciferol, aspirin, alendronate, fluticasone, vitamin B-12, Polyethyl Glycol-Propyl Glycol, L-Lysine HCl, Cranberry, glucose blood, tobramycin-dexamethasone, Blood Pressure Monitor, citalopram, promethazine, clobetasol ointment, ibuprofen, amLODipine, pravastatin, ergocalciferol, and benzonatate.  Meds ordered this encounter  Medications  . temazepam (RESTORIL) 15 MG capsule    Sig: Take 1 capsule (15 mg total) by mouth at bedtime as needed for sleep.    Dispense:  30 capsule    Refill:  0  . mirtazapine (REMERON) 15 MG tablet    Sig: Take 1.5 tablets (22.5 mg total) by mouth at bedtime.    Dispense:  45 tablet    Refill:  5  . losartan (COZAAR) 100 MG tablet    Sig: Take 1 tablet (100 mg total) by mouth daily.    Dispense:  30 tablet    Refill:  5    Medications Discontinued During This Encounter  Medication Reason  . ALPRAZolam (XANAX) 0.5 MG tablet   . mirtazapine (REMERON) 15 MG tablet Reorder  . enalapril (VASOTEC) 20 MG tablet     Follow-up: Return in about 3 months (around 07/17/2015).   Crecencio Mc, MD

## 2015-04-19 ENCOUNTER — Encounter: Payer: Self-pay | Admitting: Internal Medicine

## 2015-04-19 DIAGNOSIS — M533 Sacrococcygeal disorders, not elsewhere classified: Secondary | ICD-10-CM | POA: Insufficient documentation

## 2015-04-19 MED ORDER — LOSARTAN POTASSIUM 100 MG PO TABS: 100.0000 mg | ORAL_TABLET | Freq: Every day | ORAL | Status: DC

## 2015-04-19 MED ORDER — MIRTAZAPINE 15 MG PO TABS: 22.5000 mg | ORAL_TABLET | Freq: Every day | ORAL | Status: DC

## 2015-04-19 NOTE — Assessment & Plan Note (Signed)
Plain films suggest she did fracture her tailbone during recent fall.  Padded cushion and pain management discussed.

## 2015-04-19 NOTE — Assessment & Plan Note (Signed)
Reviewed recent fall circumstances.  Son to assess shower/floor in bathroom to improve safety.  Plain fils of sacrum and pelvis ordered.

## 2015-04-19 NOTE — Assessment & Plan Note (Signed)
Recurrent, managed with weekly Drisdol.

## 2015-04-19 NOTE — Assessment & Plan Note (Signed)
Given the development of proteinuria,  Will advise changing maximum dose of  enalapril to losartan with next refill.

## 2015-04-19 NOTE — Assessment & Plan Note (Addendum)
Given slight increased in depressive symptoms , discussed increasing dose of remeron to 22,5 mg andchanging alprazolam to temazepam given increased insomnia issues reported today.

## 2015-04-19 NOTE — Assessment & Plan Note (Signed)
Encouraged to continue daily walking and practicing rising from seated position without using hands.

## 2015-04-19 NOTE — Assessment & Plan Note (Signed)
Slight loss of control , may be due to drinking ensure,  Will recommend glucerna,  Premier Protein,  Atkins Advantage,  And Muscle Milk as alternatives that are lower in sugar.  Patient is up-to-date on eye exams and foot exam is normal today. Patient is tolerating aspirin, statin therapy for CAD risk reduction and on ACE/ARB for reduction in proteinuria.    Lab Results  Component Value Date   HGBA1C 7.4* 04/18/2015   Lab Results  Component Value Date   MICROALBUR 5.0* 04/18/2015

## 2015-06-07 ENCOUNTER — Other Ambulatory Visit: Payer: Self-pay | Admitting: Internal Medicine

## 2015-06-23 ENCOUNTER — Ambulatory Visit: Payer: Medicare HMO

## 2015-07-04 ENCOUNTER — Other Ambulatory Visit: Payer: Self-pay | Admitting: Internal Medicine

## 2015-07-04 NOTE — Telephone Encounter (Signed)
Refilled in 2013. Please advise?

## 2015-07-04 NOTE — Telephone Encounter (Signed)
refilled 

## 2015-07-13 DIAGNOSIS — H524 Presbyopia: Secondary | ICD-10-CM | POA: Diagnosis not present

## 2015-07-13 LAB — HM DIABETES EYE EXAM

## 2015-07-18 ENCOUNTER — Encounter: Payer: Self-pay | Admitting: Internal Medicine

## 2015-07-18 ENCOUNTER — Telehealth: Payer: Self-pay | Admitting: *Deleted

## 2015-07-18 ENCOUNTER — Ambulatory Visit (INDEPENDENT_AMBULATORY_CARE_PROVIDER_SITE_OTHER): Payer: Medicare HMO | Admitting: Internal Medicine

## 2015-07-18 ENCOUNTER — Other Ambulatory Visit: Payer: Self-pay | Admitting: Internal Medicine

## 2015-07-18 ENCOUNTER — Ambulatory Visit
Admission: RE | Admit: 2015-07-18 | Discharge: 2015-07-18 | Disposition: A | Discharge status: Home / Self Care | Payer: Medicare HMO | Source: Ambulatory Visit | Attending: Internal Medicine | Admitting: Internal Medicine

## 2015-07-18 VITALS — BP 132/76 | HR 97 | Temp 97.4°F | Resp 12 | Ht 67.0 in | Wt 121.8 lb

## 2015-07-18 DIAGNOSIS — M810 Age-related osteoporosis without current pathological fracture: Secondary | ICD-10-CM

## 2015-07-18 DIAGNOSIS — R69 Illness, unspecified: Secondary | ICD-10-CM | POA: Diagnosis not present

## 2015-07-18 DIAGNOSIS — F320 Major depressive disorder, single episode, mild: Secondary | ICD-10-CM

## 2015-07-18 DIAGNOSIS — E559 Vitamin D deficiency, unspecified: Secondary | ICD-10-CM

## 2015-07-18 DIAGNOSIS — R634 Abnormal weight loss: Secondary | ICD-10-CM

## 2015-07-18 DIAGNOSIS — E0821 Diabetes mellitus due to underlying condition with diabetic nephropathy: Secondary | ICD-10-CM | POA: Diagnosis not present

## 2015-07-18 DIAGNOSIS — E785 Hyperlipidemia, unspecified: Secondary | ICD-10-CM

## 2015-07-18 LAB — COMPREHENSIVE METABOLIC PANEL
ALT: 13 U/L (ref 0–35)
AST: 17 U/L (ref 0–37)
Albumin: 4.5 g/dL (ref 3.5–5.2)
Alkaline Phosphatase: 67 U/L (ref 39–117)
BILIRUBIN TOTAL: 0.5 mg/dL (ref 0.2–1.2)
BUN: 26 mg/dL — ABNORMAL HIGH (ref 6–23)
CHLORIDE: 104 meq/L (ref 96–112)
CO2: 27 meq/L (ref 19–32)
Calcium: 9.5 mg/dL (ref 8.4–10.5)
Creatinine, Ser: 0.73 mg/dL (ref 0.40–1.20)
GFR: 81.2 mL/min (ref >60.00)
GLUCOSE: 137 mg/dL — AB (ref 70–99)
Potassium: 4 mEq/L (ref 3.5–5.1)
Sodium: 141 mEq/L (ref 135–145)
Total Protein: 7.9 g/dL (ref 6.0–8.3)

## 2015-07-18 LAB — LIPID PANEL
CHOL/HDL RATIO: 4
Cholesterol: 211 mg/dL — ABNORMAL HIGH (ref 0–200)
HDL: 53.9 mg/dL (ref >39.00)
LDL CALC: 125 mg/dL — AB (ref 0–99)
NONHDL: 157.08
Triglycerides: 159 mg/dL — ABNORMAL HIGH (ref 0.0–149.0)
VLDL: 31.8 mg/dL (ref 0.0–40.0)

## 2015-07-18 LAB — VITAMIN D 25 HYDROXY (VIT D DEFICIENCY, FRACTURES): VITD: 22.61 ng/mL — ABNORMAL LOW (ref 30.00–100.00)

## 2015-07-18 MED ORDER — TEMAZEPAM 15 MG PO CAPS: ORAL_CAPSULE | ORAL | Status: DC

## 2015-07-18 MED ORDER — NYSTATIN-TRIAMCINOLONE 100000-0.1 UNIT/GM-% EX OINT: 1.0000 "application " | TOPICAL_OINTMENT | Freq: Two times a day (BID) | CUTANEOUS | Status: DC

## 2015-07-18 MED ORDER — FLUCONAZOLE 150 MG PO TABS: 150.0000 mg | ORAL_TABLET | Freq: Every day | ORAL | Status: DC

## 2015-07-18 MED ORDER — MIRTAZAPINE 30 MG PO TABS: 30.0000 mg | ORAL_TABLET | Freq: Every day | ORAL | Status: DC

## 2015-07-18 NOTE — Telephone Encounter (Signed)
Called pt for recollect on a1c pt will come back 03.22.2017

## 2015-07-18 NOTE — Patient Instructions (Addendum)
Let's increase the mirtazipine to 30 mg one hour before bedtime to improve your mood and appetite  I want you to Try drinking 2 Protein shakes daily  (try the other flavors:  Vanilla, strawberry and caramel!)     Referral to [facility] in process   DO NOT use nystatin ointment in the vaginal area.  The Fluconazole tablets should be taken once daily for 2 days fo rthis type of yeast infection

## 2015-07-18 NOTE — Progress Notes (Addendum)
Subjective:  Patient ID: Cheyenne Torres, female    DOB: 12-Apr-1932  Age: 82 y.o. MRN: [id number]  CC: The primary encounter diagnosis was Diabetes mellitus due to underlying condition with diabetic nephropathy, without long-term current use of insulin ([place]). Diagnoses of Vitamin D deficiency, Depression, major, single episode, mild ([place]), Postmenopausal osteoporosis, Unintentional weight loss, and Hyperlipidemia were also pertinent to this visit.  HPI Cheyenne Torres presents for follow up on Type 2 DM.  She has lost weight,  Has no appetite.  Checks sugars 1-2 tiems daily with most fastings < 130, no lows and a high of 170.    Had her eye exam Foot exam normal Vaginitis  Wt loss 5 lbs.she denies pain,  But reports anorexia. And fatigue   May need audiology referral to [facility] for hearing aid   DEXA done today    Lab Results  Component Value Date   HGBA1C 7.0* 07/20/2015     Outpatient Prescriptions Prior to Visit  Medication Sig Dispense Refill  . alendronate (FOSAMAX) 70 MG tablet Take 1 tablet (70 mg total) by mouth every 7 (seven) days. Take with a full glass of water on an empty stomach. 4 tablet 3  . amLODipine (NORVASC) 5 MG tablet Take 1 tablet (5 mg total) by mouth daily. 90 tablet 1  . aspirin 81 MG tablet Take 81 mg by mouth daily.      . benzonatate (TESSALON) 100 MG capsule TAKE TWO CAPSULES BY MOUTH THREE TIMES DAILY AS NEEDED FOR COUGH 120 capsule 0  . Blood Pressure Monitor KIT Monitor BP once daily 1 each 0  . cholecalciferol (VITAMIN D) 1000 UNITS tablet Take 1,000 Units by mouth daily.      . citalopram (CELEXA) 20 MG tablet TAKE ONE TABLET BY MOUTH ONCE DAILY 90 tablet 0  . clobetasol ointment (TEMOVATE) 0.05 % APPLY TOPICALLY TO AFFECTED AREA TWICE DAILY 30 g 3  . Cranberry (CRANBERRY CONCENTRATE) 500 MG CAPS Take 1 capsule (500 mg total) by mouth daily. 90 each 0  . docusate sodium (COLACE) 100 MG capsule Take 100 mg by mouth daily.      .  fluticasone (FLONASE) 50 MCG/ACT nasal spray Place 2 sprays into the nose daily. 16 g 6  . glucose blood (FREESTYLE LITE) test strip Use once daily to  check blood sugars   250.00  90 day supply 100 each 3  . ibuprofen (ADVIL,MOTRIN) 600 MG tablet Take 1 tablet (600 mg total) by mouth 3 (three) times daily as needed. 90 tablet 1  . L-Lysine HCl 1000 MG TABS Take by mouth as needed.    Marland Kitchen losartan (COZAAR) 100 MG tablet Take 1 tablet (100 mg total) by mouth daily. 30 tablet 5  . NON FORMULARY Fiber cap one daily     . Polyethyl Glycol-Propyl Glycol (SYSTANE ULTRA) 0.4-0.3 % SOLN Apply to eye daily.    . promethazine (PHENERGAN) 12.5 MG tablet Take 1 tablet (12.5 mg total) by mouth every 8 (eight) hours as needed. for nausea 20 tablet 0  . tobramycin-dexamethasone (TOBRADEX) ophthalmic solution Place 1 drop into the left eye every 4 (four) hours while awake.    . vitamin B-12 (CYANOCOBALAMIN) 500 MCG tablet Take 500 mcg by mouth daily.    . ergocalciferol (DRISDOL) 50000 UNITS capsule Take 1 capsule (50,000 Units total) by mouth once a week. 12 capsule 0  . mirtazapine (REMERON) 15 MG tablet Take 1.5 tablets (22.5 mg total) by mouth at bedtime. [place]  tablet 5  . pravastatin (PRAVACHOL) 40 MG tablet Take 1 tablet (40 mg total) by mouth daily. 90 tablet 1  . temazepam (RESTORIL) 15 MG capsule TAKE ONE CAPSULE BY MOUTH AT BEDTIME AS NEEDED FOR  SLEEP 30 capsule 0  . alendronate (FOSAMAX) 70 MG tablet TAKE ONE TABLET BY MOUTH ONCE A WEEK IN THE MORNING WITH A FULL GLASS OF WATER, 30 MINUTES BEFORE A MEAL OR BEVERAGE. REMAIN UPRIGHT 4 tablet 11   No facility-administered medications prior to visit.    Review of Systems;  Patient denies headache, fevers, malaise, unintentional weight loss, skin rash, eye pain, sinus congestion and sinus pain, sore throat, dysphagia,  hemoptysis , cough, dyspnea, wheezing, chest pain, palpitations, orthopnea, edema, abdominal pain, nausea, melena, diarrhea, constipation,  flank pain, dysuria, hematuria, urinary  Frequency, nocturia, numbness, tingling, seizures,  Focal weakness, Loss of consciousness,  Tremor, insomnia, depression, anxiety, and suicidal ideation.      Objective:  BP 132/76 mmHg  Pulse 97  Temp(Src) 97.4 F (36.3 C) (Oral)  Resp 12  Ht 5' 7"  (1.702 m)  Wt 121 lb 12 oz (55.225 kg)  BMI 19.06 kg/m2  SpO2 100%  BP Readings from Last 3 Encounters:  07/18/15 132/76  04/18/15 148/78  01/12/15 148/78    Wt Readings from Last 3 Encounters:  07/18/15 121 lb 12 oz (55.225 kg)  04/18/15 126 lb (57.153 kg)  01/12/15 128 lb 4 oz (58.174 kg)    General appearance: alert, cooperative and appears stated age Ears: normal TM's and external ear canals both ears Throat: lips, mucosa, and tongue normal; teeth and gums normal Neck: no adenopathy, no carotid bruit, supple, symmetrical, trachea midline and thyroid not enlarged, symmetric, no tenderness/mass/nodules Back: symmetric, no curvature. ROM normal. No CVA tenderness. Lungs: clear to auscultation bilaterally Heart: regular rate and rhythm, S1, S2 normal, no murmur, click, rub or gallop Abdomen: soft, non-tender; bowel sounds normal; no masses,  no organomegaly Pulses: 2+ and symmetric Skin: Skin color, texture, turgor normal. No rashes or lesions Lymph nodes: Cervical, supraclavicular, and axillary nodes normal.  Lab Results  Component Value Date   HGBA1C 7.0* 07/20/2015   HGBA1C 7.4* 04/18/2015   HGBA1C 7.0* 01/12/2015    Lab Results  Component Value Date   CREATININE 0.73 07/18/2015   CREATININE 0.79 04/18/2015   CREATININE 0.77 01/12/2015    Lab Results  Component Value Date   WBC 8.3 10/11/2014   HGB 11.7* 10/11/2014   HCT 35.9* 10/11/2014   PLT 267.0 10/11/2014   GLUCOSE 137* 07/18/2015   CHOL 211* 07/18/2015   TRIG 159.0* 07/18/2015   HDL 53.90 07/18/2015   LDLDIRECT 102.0 04/18/2015   LDLCALC 125* 07/18/2015   ALT 13 07/18/2015   AST 17 07/18/2015   NA 141  07/18/2015   K 4.0 07/18/2015   CL 104 07/18/2015   CREATININE 0.73 07/18/2015   BUN 26* 07/18/2015   CO2 27 07/18/2015   TSH 4.88* 12/29/2013   HGBA1C 7.0* 07/20/2015   MICROALBUR 5.0* 04/18/2015    Dg Bone Density  07/18/2015  EXAM: DUAL X-RAY ABSORPTIOMETRY (DXA) FOR BONE MINERAL DENSITY IMPRESSION: Dear Dr. Derrel Nip, Your patient Elexa Kivi completed a BMD test on 07/18/2015 using the [facility] (analysis version: 14.10) manufactured by [employer]. The following summarizes the results of our evaluation. PATIENT BIOGRAPHICAL: Name: Reiko, Vinje Patient ID: [id number] Birth Date: [birth year] Height: 66.0 in. Gender: Female Exam Date: 07/18/2015 Weight: 120.3 lbs. Indications: Advanced Age, Caucasian, Height Loss, History  of Breast Cancer, Hysterectomy, Postmenopausal Fractures: Treatments: ASPRIN 81 MG, fosamax, Multi-Vitamin with calcium ASSESSMENT: The BMD measured at Femur Total Left is 0.673 g/cm2 with a T-score of -2.7. This patient is considered osteoporotic according to [facility]) criteria. Site Region Measured Measured WHO Young Adult BMD Date       Age      Classification T-score AP Spine L1-L4 9/18/[age] 81.6 Osteopenia -1.1 1.058 g/cm2 AP Spine L1-L4 [birth year]/09/2007 73.3 Osteopenia -1.8 0.976 g/cm2 DualFemur Total Left 07/18/2015 81.6 Osteoporosis -2.7 0.673 g/cm2 DualFemur Total Left [birth year]09[birth year]2007 73.3 Osteoporosis -2.8 0.658 g/cm2 World Health Organization [facility]) criteria for post-menopausal, Caucasian Women: Normal:       T-score at or above -1 SD Osteopenia:   T-score between -1 and -2.5 SD Osteoporosis: T-score at or below -2.5 SD RECOMMENDATIONS: [facility] recommends that FDA-approved medical therapies be considered in postmenopausal women and men age 48 or older with a: 1. Hip or vertebral (clinical or morphometric) fracture. 2. T-score of < -2.5 at the spine or hip. 3. Ten-year fracture probability by FRAX of 3% or greater for hip  fracture or 20% or greater for major osteoporotic fracture. All treatment decisions require clinical judgment and consideration of individual patient factors, including patient preferences, co-morbidities, previous drug use, risk factors not captured in the FRAX model (e.g. falls, vitamin D deficiency, increased bone turnover, interval significant decline in bone density) and possible under - or over-estimation of fracture risk by FRAX. All patients should ensure an adequate intake of dietary calcium (1200 mg/d) and vitamin D (800 IU daily) unless contraindicated. FOLLOW-UP: People with diagnosed cases of osteoporosis or at high risk for fracture should have regular bone mineral density tests. For patients eligible for Medicare, routine testing is allowed once every 2 years. The testing frequency can be increased to one year for patients who have rapidly progressing disease, those who are receiving or discontinuing medical therapy to restore bone mass, or have additional risk factors. I have reviewed this report, and agree with the above findings. [facility] Electronically Signed   By: David  Martinique M.D.   On: 07/18/2015 10:20    Assessment & Plan:   Problem List Items Addressed This Visit    Hyperlipidemia    Not at goal on pravastatin.  Recommend changing  higher potency statin.  Lab Results  Component Value Date   CHOL 211* 07/18/2015   HDL 53.90 07/18/2015   LDLCALC 125* 07/18/2015   LDLDIRECT 102.0 04/18/2015   TRIG 159.0* 07/18/2015   CHOLHDL 4 07/18/2015         Relevant Medications   atorvastatin (LIPITOR) 20 MG tablet   Vitamin D deficiency    Recurrent continue weekly Drisdol indefinitely         Relevant Orders   VITAMIN D 25 Hydroxy (Vit-D Deficiency, Fractures) (Completed)   Postmenopausal osteoporosis    Managed with alendronate.  DEX done today.reviewed calcium and Vit D intake       Depression, major, single episode, mild ([place])    With anorexia and  sedentary lifestyle .  increase remeron dose to 30 mg .  restoril for insomnia       Relevant Medications   mirtazapine (REMERON) 30 MG tablet   Unintentional weight loss     I have reviewed her diet and recommended that she increase her protein and fat intake while monitoring her carbohydrates.   remeron dose increased to 30 mg       Diabetes mellitus with diabetic  nephropathy ([place]) - Primary    Slight loss of control , may be due to drinking ensure,  Will recommend glucerna,  Premier Protein,  Atkins Advantage,  And Muscle Milk as alternatives that are lower in sugar.  Patient is up-to-date on eye exams and foot exam is normal today. Patient is tolerating aspirin, statin therapy for CAD risk reduction and on ACE/ARB for reduction in proteinuria.    Lab Results  Component Value Date   HGBA1C 7.4* 04/18/2015   Lab Results  Component Value Date   MICROALBUR 5.0* 04/18/2015                          Relevant Medications   atorvastatin (LIPITOR) 20 MG tablet   Other Relevant Orders   Comprehensive metabolic panel (Completed)   Lipid panel (Completed)   Hemoglobin A1c      I have discontinued Ms. Hogan's pravastatin and temazepam. I have also changed her mirtazapine. Additionally, I am having her start on fluconazole, nystatin-triamcinolone ointment, and atorvastatin. Lastly, I am having her maintain her docusate sodium, NON FORMULARY, cholecalciferol, aspirin, alendronate, fluticasone, vitamin B-12, Polyethyl Glycol-Propyl Glycol, L-Lysine HCl, Cranberry, glucose blood, tobramycin-dexamethasone, Blood Pressure Monitor, citalopram, promethazine, clobetasol ointment, ibuprofen, amLODipine, benzonatate, and losartan.  Meds ordered this encounter  Medications  . mirtazapine (REMERON) 30 MG tablet    Sig: Take 1 tablet (30 mg total) by mouth at bedtime.    Dispense:  90 tablet    Refill:  1  . DISCONTD: temazepam (RESTORIL) 15 MG capsule    Sig: TAKE ONE CAPSULE BY  MOUTH AT BEDTIME AS NEEDED FOR  SLEEP    Dispense:  30 capsule    Refill:  5  . fluconazole (DIFLUCAN) 150 MG tablet    Sig: Take 1 tablet (150 mg total) by mouth daily.    Dispense:  2 tablet    Refill:  0  . nystatin-triamcinolone ointment (MYCOLOG)    Sig: Apply 1 application topically 2 (two) times daily. To irritated skin    Dispense:  30 g    Refill:  0  . atorvastatin (LIPITOR) 20 MG tablet    Sig: Take 1 tablet (20 mg total) by mouth daily.    Dispense:  90 tablet    Refill:  3    Medications Discontinued During This Encounter  Medication Reason  . alendronate (FOSAMAX) 70 MG tablet Duplicate  . mirtazapine (REMERON) 15 MG tablet Reorder  . temazepam (RESTORIL) 15 MG capsule Reorder  . pravastatin (PRAVACHOL) 40 MG tablet     Follow-up: No Follow-up on file.   Crecencio Mc, MD

## 2015-07-18 NOTE — Progress Notes (Signed)
Pre-visit discussion using our clinic review tool. No additional management support is needed unless otherwise documented below in the visit note.  

## 2015-07-19 ENCOUNTER — Encounter: Payer: Self-pay | Admitting: Internal Medicine

## 2015-07-19 NOTE — Assessment & Plan Note (Signed)
With anorexia and sedentary lifestyle .  increase remeron dose to 30 mg .  restoril for insomnia

## 2015-07-19 NOTE — Assessment & Plan Note (Signed)
Managed with alendronate.  DEX done today.reviewed calcium and Vit D intake

## 2015-07-19 NOTE — Assessment & Plan Note (Signed)
I have reviewed her diet and recommended that she increase her protein and fat intake while monitoring her carbohydrates.   remeron dose increased to 30 mg

## 2015-07-19 NOTE — Assessment & Plan Note (Signed)
Slight loss of control , may be due to drinking ensure,  Will recommend glucerna,  Premier Protein,  Atkins Advantage,  And Muscle Milk as alternatives that are lower in sugar.  Patient is up-to-date on eye exams and foot exam is normal today. Patient is tolerating aspirin, statin therapy for CAD risk reduction and on ACE/ARB for reduction in proteinuria.    Lab Results  Component Value Date   HGBA1C 7.4* 04/18/2015   Lab Results  Component Value Date   MICROALBUR 5.0* 04/18/2015

## 2015-07-20 ENCOUNTER — Encounter: Payer: Self-pay | Admitting: Internal Medicine

## 2015-07-20 ENCOUNTER — Other Ambulatory Visit (INDEPENDENT_AMBULATORY_CARE_PROVIDER_SITE_OTHER): Payer: Medicare HMO

## 2015-07-20 ENCOUNTER — Other Ambulatory Visit: Payer: Self-pay | Admitting: Internal Medicine

## 2015-07-20 ENCOUNTER — Telehealth: Payer: Self-pay

## 2015-07-20 DIAGNOSIS — M810 Age-related osteoporosis without current pathological fracture: Secondary | ICD-10-CM | POA: Insufficient documentation

## 2015-07-20 DIAGNOSIS — H9190 Unspecified hearing loss, unspecified ear: Secondary | ICD-10-CM

## 2015-07-20 DIAGNOSIS — E0821 Diabetes mellitus due to underlying condition with diabetic nephropathy: Secondary | ICD-10-CM | POA: Diagnosis not present

## 2015-07-20 DIAGNOSIS — Z794 Long term (current) use of insulin: Secondary | ICD-10-CM

## 2015-07-20 LAB — HEMOGLOBIN A1C: Hgb A1c MFr Bld: 7 % — ABNORMAL HIGH (ref 4.6–6.5)

## 2015-07-20 MED ORDER — ERGOCALCIFEROL 1.25 MG (50000 UT) PO CAPS: 50000.0000 [IU] | ORAL_CAPSULE | ORAL | Status: DC

## 2015-07-20 MED ORDER — ATORVASTATIN CALCIUM 20 MG PO TABS: 20.0000 mg | ORAL_TABLET | Freq: Every day | ORAL | Status: DC

## 2015-07-20 NOTE — Telephone Encounter (Signed)
Pt is requesting Bone Density results. Please advise, thanks

## 2015-07-20 NOTE — Assessment & Plan Note (Signed)
Recurrent continue weekly Drisdol indefinitely

## 2015-07-20 NOTE — Addendum Note (Signed)
Addended by: Crecencio Mc on: 07/20/2015 07:10 PM   Modules accepted: Orders, Medications

## 2015-07-20 NOTE — Telephone Encounter (Signed)
Results Sent via mychart.  Please remind patients that "Dr Derrel Nip will notify you when she has a chance to review your tests, and it may take up to 4 days because of the number of tests that she has to review daily  ." and that calling is not necessary

## 2015-07-20 NOTE — Telephone Encounter (Signed)
Notiifed and noted

## 2015-07-20 NOTE — Assessment & Plan Note (Signed)
Not at goal on pravastatin.  Recommend changing  higher potency statin.  Lab Results  Component Value Date   CHOL 211* 07/18/2015   HDL 53.90 07/18/2015   LDLCALC 125* 07/18/2015   LDLDIRECT 102.0 04/18/2015   TRIG 159.0* 07/18/2015   CHOLHDL 4 07/18/2015

## 2015-07-22 ENCOUNTER — Telehealth: Payer: Self-pay

## 2015-07-22 NOTE — Telephone Encounter (Signed)
Patient called triage line, requesting bone density results. I see that she has read her mychart with the results.

## 2015-08-16 DIAGNOSIS — H903 Sensorineural hearing loss, bilateral: Secondary | ICD-10-CM | POA: Diagnosis not present

## 2015-10-02 ENCOUNTER — Other Ambulatory Visit: Payer: Self-pay | Admitting: Internal Medicine

## 2015-10-06 ENCOUNTER — Other Ambulatory Visit: Payer: Self-pay | Admitting: Internal Medicine

## 2015-10-26 ENCOUNTER — Ambulatory Visit (INDEPENDENT_AMBULATORY_CARE_PROVIDER_SITE_OTHER): Payer: Medicare HMO | Admitting: Internal Medicine

## 2015-10-26 ENCOUNTER — Telehealth: Payer: Self-pay

## 2015-10-26 ENCOUNTER — Encounter: Payer: Self-pay | Admitting: Internal Medicine

## 2015-10-26 VITALS — BP 124/70 | HR 91 | Temp 97.5°F | Resp 12 | Ht 67.0 in | Wt 126.0 lb

## 2015-10-26 DIAGNOSIS — E785 Hyperlipidemia, unspecified: Secondary | ICD-10-CM | POA: Diagnosis not present

## 2015-10-26 DIAGNOSIS — E559 Vitamin D deficiency, unspecified: Secondary | ICD-10-CM | POA: Diagnosis not present

## 2015-10-26 DIAGNOSIS — D519 Vitamin B12 deficiency anemia, unspecified: Secondary | ICD-10-CM | POA: Diagnosis not present

## 2015-10-26 DIAGNOSIS — R29898 Other symptoms and signs involving the musculoskeletal system: Secondary | ICD-10-CM

## 2015-10-26 DIAGNOSIS — E1321 Other specified diabetes mellitus with diabetic nephropathy: Secondary | ICD-10-CM

## 2015-10-26 DIAGNOSIS — M810 Age-related osteoporosis without current pathological fracture: Secondary | ICD-10-CM

## 2015-10-26 DIAGNOSIS — E1169 Type 2 diabetes mellitus with other specified complication: Secondary | ICD-10-CM | POA: Diagnosis not present

## 2015-10-26 DIAGNOSIS — I10 Essential (primary) hypertension: Secondary | ICD-10-CM

## 2015-10-26 DIAGNOSIS — F320 Major depressive disorder, single episode, mild: Secondary | ICD-10-CM

## 2015-10-26 LAB — LIPID PANEL
CHOLESTEROL: 154 mg/dL (ref 0–200)
HDL: 66.5 mg/dL (ref >39.00)
LDL Cholesterol: 64 mg/dL (ref 0–99)
NonHDL: 87.65
Total CHOL/HDL Ratio: 2
Triglycerides: 118 mg/dL (ref 0.0–149.0)
VLDL: 23.6 mg/dL (ref 0.0–40.0)

## 2015-10-26 LAB — COMPREHENSIVE METABOLIC PANEL
ALBUMIN: 4.4 g/dL (ref 3.5–5.2)
ALK PHOS: 70 U/L (ref 39–117)
ALT: 11 U/L (ref 0–35)
AST: 14 U/L (ref 0–37)
BILIRUBIN TOTAL: 0.5 mg/dL (ref 0.2–1.2)
BUN: 26 mg/dL — ABNORMAL HIGH (ref 6–23)
CALCIUM: 9.8 mg/dL (ref 8.4–10.5)
CHLORIDE: 104 meq/L (ref 96–112)
CO2: 28 mEq/L (ref 19–32)
Creatinine, Ser: 0.74 mg/dL (ref 0.40–1.20)
GFR: 79.88 mL/min (ref >60.00)
Glucose, Bld: 99 mg/dL (ref 70–99)
Potassium: 4.1 mEq/L (ref 3.5–5.1)
Sodium: 139 mEq/L (ref 135–145)
TOTAL PROTEIN: 8.3 g/dL (ref 6.0–8.3)

## 2015-10-26 LAB — HEMOGLOBIN A1C: HEMOGLOBIN A1C: 7.1 % — AB (ref 4.6–6.5)

## 2015-10-26 LAB — VITAMIN D 25 HYDROXY (VIT D DEFICIENCY, FRACTURES): VITD: 44.19 ng/mL (ref 30.00–100.00)

## 2015-10-26 LAB — LDL CHOLESTEROL, DIRECT: LDL DIRECT: 73 mg/dL

## 2015-10-26 NOTE — Telephone Encounter (Signed)
Patient had a dexa scan in March, needs Prolia authorization done.  Thanks

## 2015-10-26 NOTE — Progress Notes (Signed)
Pre-visit discussion using our clinic review tool. No additional management support is needed unless otherwise documented below in the visit note.  

## 2015-10-26 NOTE — Progress Notes (Signed)
Subjective:  Patient ID: Cheyenne Torres, female    DOB: 1932/01/30  Age: 82 y.o. MRN: [id number]  CC: The primary encounter diagnosis was Osteoporosis. Diagnoses of B12 deficiency anemia, Vitamin D deficiency, Other specified diabetes mellitus with diabetic nephropathy, without long-term current use of insulin ([place]), Hyperlipidemia associated with type 2 diabetes mellitus ([place]), Proximal leg weakness, Depression, major, single episode, mild ([place]), Essential hypertension, and Postmenopausal osteoporosis were also pertinent to this visit.  HPI Cheyenne Torres presents for follow  up on chronic conditions including diabetes, hypertension and hyperlipidemia.  She feels generally well,  Appetite has improved and she has gained a few pounds.  Walking more frequently for exercise. No falls in the past 6 months  Lab Results  Component Value Date   HGBA1C 7.1* 10/26/2015   No falls,  eating well,  Moving more.   Lab Results  Component Value Date   MICROALBUR 5.0* 04/18/2015    Has not heard about Prolia   Outpatient Prescriptions Prior to Visit  Medication Sig Dispense Refill  . alendronate (FOSAMAX) 70 MG tablet Take 1 tablet (70 mg total) by mouth every 7 (seven) days. Take with a full glass of water on an empty stomach. 4 tablet 3  . amLODipine (NORVASC) 5 MG tablet TAKE ONE TABLET BY MOUTH ONCE DAILY 90 tablet 3  . aspirin 81 MG tablet Take 81 mg by mouth daily.      Marland Kitchen atorvastatin (LIPITOR) 20 MG tablet Take 1 tablet (20 mg total) by mouth daily. 90 tablet 3  . benzonatate (TESSALON) 100 MG capsule TAKE TWO CAPSULES BY MOUTH THREE TIMES DAILY AS NEEDED FOR COUGH 120 capsule 0  . Blood Pressure Monitor KIT Monitor BP once daily 1 each 0  . cholecalciferol (VITAMIN D) 1000 UNITS tablet Take 1,000 Units by mouth daily.      . citalopram (CELEXA) 20 MG tablet TAKE ONE TABLET BY MOUTH ONCE DAILY 90 tablet 0  . clobetasol ointment (TEMOVATE) 0.05 % APPLY TOPICALLY TO AFFECTED AREA TWICE  DAILY 30 g 3  . Cranberry (CRANBERRY CONCENTRATE) 500 MG CAPS Take 1 capsule (500 mg total) by mouth daily. 90 each 0  . docusate sodium (COLACE) 100 MG capsule Take 100 mg by mouth daily.      . ergocalciferol (DRISDOL) 50000 units capsule Take 1 capsule (50,000 Units total) by mouth once a week. 12 capsule 0  . fluconazole (DIFLUCAN) 150 MG tablet Take 1 tablet (150 mg total) by mouth daily. 2 tablet 0  . fluticasone (FLONASE) 50 MCG/ACT nasal spray Place 2 sprays into the nose daily. 16 g 6  . glucose blood (FREESTYLE LITE) test strip Use once daily to  check blood sugars   250.00  90 day supply 100 each 3  . ibuprofen (ADVIL,MOTRIN) 600 MG tablet Take 1 tablet (600 mg total) by mouth 3 (three) times daily as needed. 90 tablet 1  . L-Lysine HCl 1000 MG TABS Take by mouth as needed.    Marland Kitchen losartan (COZAAR) 100 MG tablet TAKE ONE TABLET BY MOUTH DAILY 30 tablet 5  . mirtazapine (REMERON) 30 MG tablet Take 1 tablet (30 mg total) by mouth at bedtime. 90 tablet 1  . NON FORMULARY Fiber cap one daily     . nystatin-triamcinolone ointment (MYCOLOG) Apply 1 application topically 2 (two) times daily. To irritated skin 30 g 0  . Polyethyl Glycol-Propyl Glycol (SYSTANE ULTRA) 0.4-0.3 % SOLN Apply to eye daily.    . promethazine (PHENERGAN) 12.5  MG tablet Take 1 tablet (12.5 mg total) by mouth every 8 (eight) hours as needed. for nausea 20 tablet 0  . temazepam (RESTORIL) 15 MG capsule TAKE ONE CAPSULE BY MOUTH AT BEDTIME AS NEEDED FOR  SLEEP 30 capsule 3  . tobramycin-dexamethasone (TOBRADEX) ophthalmic solution Place 1 drop into the left eye every 4 (four) hours while awake.    . vitamin B-12 (CYANOCOBALAMIN) 500 MCG tablet Take 500 mcg by mouth daily.     No facility-administered medications prior to visit.    Review of Systems;  Patient denies headache, fevers, malaise, unintentional weight loss, skin rash, eye pain, sinus congestion and sinus pain, sore throat, dysphagia,  hemoptysis , cough,  dyspnea, wheezing, chest pain, palpitations, orthopnea, edema, abdominal pain, nausea, melena, diarrhea, constipation, flank pain, dysuria, hematuria, urinary  Frequency, nocturia, numbness, tingling, seizures,  Focal weakness, Loss of consciousness,  Tremor, insomnia, depression, anxiety, and suicidal ideation.      Objective:  BP 124/70 mmHg  Pulse 91  Temp(Src) 97.5 F (36.4 C) (Oral)  Resp 12  Ht '5\' 7"'$  (1.702 m)  Wt 126 lb (57.153 kg)  BMI 19.73 kg/m2  SpO2 98%  BP Readings from Last 3 Encounters:  10/26/15 124/70  07/18/15 132/76  04/18/15 148/78    Wt Readings from Last 3 Encounters:  10/26/15 126 lb (57.153 kg)  07/18/15 121 lb 12 oz (55.225 kg)  04/18/15 126 lb (57.153 kg)    General appearance: alert, cooperative and appears stated age Ears: normal TM's and external ear canals both ears Throat: lips, mucosa, and tongue normal; teeth and gums normal Neck: no adenopathy, no carotid bruit, supple, symmetrical, trachea midline and thyroid not enlarged, symmetric, no tenderness/mass/nodules Back: symmetric, no curvature. ROM normal. No CVA tenderness. Lungs: clear to auscultation bilaterally Heart: regular rate and rhythm, S1, S2 normal, no murmur, click, rub or gallop Abdomen: soft, non-tender; bowel sounds normal; no masses,  no organomegaly Pulses: 2+ and symmetric Skin: Skin color, texture, turgor normal. No rashes or lesions Lymph nodes: Cervical, supraclavicular, and axillary nodes normal.  Lab Results  Component Value Date   HGBA1C 7.1* 10/26/2015   HGBA1C 7.0* 07/20/2015   HGBA1C 7.4* 04/18/2015    Lab Results  Component Value Date   CREATININE 0.74 10/26/2015   CREATININE 0.73 07/18/2015   CREATININE 0.79 04/18/2015    Lab Results  Component Value Date   WBC 8.3 10/11/2014   HGB 11.7* 10/11/2014   HCT 35.9* 10/11/2014   PLT 267.0 10/11/2014   GLUCOSE 99 10/26/2015   CHOL 154 10/26/2015   TRIG 118.0 10/26/2015   HDL 66.50 10/26/2015    LDLDIRECT 73.0 10/26/2015   LDLCALC 64 10/26/2015   ALT 11 10/26/2015   AST 14 10/26/2015   NA 139 10/26/2015   K 4.1 10/26/2015   CL 104 10/26/2015   CREATININE 0.74 10/26/2015   BUN 26* 10/26/2015   CO2 28 10/26/2015   TSH 4.88* 12/29/2013   HGBA1C 7.1* 10/26/2015   MICROALBUR 5.0* 04/18/2015    Dg Bone Density  07/18/2015  EXAM: DUAL X-RAY ABSORPTIOMETRY (DXA) FOR BONE MINERAL DENSITY IMPRESSION: Dear Dr. Derrel Nip, Your patient Cheyenne Torres completed a BMD test on 07/18/2015 using the [place] (analysis version: 14.10) manufactured by [employer]. The following summarizes the results of our evaluation. PATIENT BIOGRAPHICAL: Name: Cheyenne Torres, Cheyenne Torres Patient ID: [id number] Birth Date: 1931/07/08 Height: 66.0 in. Gender: Female Exam Date: 07/18/2015 Weight: 120.3 lbs. Indications: Advanced Age, Caucasian, Height Loss, History of Breast Cancer,  Hysterectomy, Postmenopausal Fractures: Treatments: ASPRIN 81 MG, fosamax, Multi-Vitamin with calcium ASSESSMENT: The BMD measured at Femur Total Left is 0.673 g/cm2 with a T-score of -2.7. This patient is considered osteoporotic according to [facility]) criteria. Site Region Measured Measured WHO Young Adult BMD Date       Age      Classification T-score AP Spine L1-L4 07/18/2015 81.6 Osteopenia -1.1 1.058 g/cm2 AP Spine L1-L4 04/09/2007 73.3 Osteopenia -1.8 0.976 g/cm2 DualFemur Total Left 07/18/2015 81.6 Osteoporosis -2.7 0.673 g/cm2 DualFemur Total Left [birth year]09/2007 73.3 Osteoporosis -2.8 0.658 g/cm2 World Health Organization [facility]) criteria for post-menopausal, Caucasian Women: Normal:       T-score at or above -1 SD Osteopenia:   T-score between -1 and -2.5 SD Osteoporosis: T-score at or below -2.5 SD RECOMMENDATIONS: [place] recommends that FDA-approved medical therapies be considered in postmenopausal women and men age 82 or older with a: 1. Hip or vertebral (clinical or morphometric) fracture. 2.  T-score of < -2.5 at the spine or hip. 3. Ten-year fracture probability by FRAX of 3% or greater for hip fracture or 20% or greater for major osteoporotic fracture. All treatment decisions require clinical judgment and consideration of individual patient factors, including patient preferences, co-morbidities, previous drug use, risk factors not captured in the FRAX model (e.g. falls, vitamin D deficiency, increased bone turnover, interval significant decline in bone density) and possible under - or over-estimation of fracture risk by FRAX. All patients should ensure an adequate intake of dietary calcium (1200 mg/d) and vitamin D (800 IU daily) unless contraindicated. FOLLOW-UP: People with diagnosed cases of osteoporosis or at high risk for fracture should have regular bone mineral density tests. For patients eligible for Medicare, routine testing is allowed once every 2 years. The testing frequency can be increased to one year for patients who have rapidly progressing disease, those who are receiving or discontinuing medical therapy to restore bone mass, or have additional risk factors. I have reviewed this report, and agree with the above findings. [facility] Electronically Signed   By: David  Martinique M.D.   On: 07/18/2015 10:20    Assessment & Plan:   Problem List Items Addressed This Visit    Postmenopausal osteoporosis    Managed with alendronate.  Given her age,  Recommending  Change in therapy to Prolia        Hypertension     Proteinuria and improved with change to  losartan .  Lab Results  Component Value Date   CREATININE 0.74 10/26/2015   Lab Results  Component Value Date   NA 139 10/26/2015   K 4.1 10/26/2015   CL 104 10/26/2015   CO2 28 10/26/2015   Lab Results  Component Value Date   MICROALBUR 5.0* 04/18/2015          Depression, major, single episode, mild (HCC)    improved with celexa and mirtazipine      Diabetes mellitus with diabetic nephropathy  (HCC)    Currently well-controlled on current medications .  hemoglobin A1c is at goal of less than 7.0 . Patient is reminded to schedule an annual eye exam and foot exam is normal today. Patient has no microalbuminuria. Patient is tolerating statin therapy for CAD risk reduction and on ACE/ARB for renal protection and hypertension   Patient is up-to-date on eye exams and foot exam is normal today. Patient is tolerating aspirin, statin therapy for CAD risk reduction and on ACE/ARB for reduction in proteinuria.    Lab  Results  Component Value Date   HGBA1C 7.1* 10/26/2015   Lab Results  Component Value Date   MICROALBUR 5.0* 04/18/2015                            Relevant Orders   Comprehensive metabolic panel (Completed)   Hemoglobin A1c (Completed)   Lipid panel (Completed)   Proximal leg weakness    improving with daily exercise,  No recent falls.       Vitamin D deficiency   Relevant Orders   VITAMIN D 25 Hydroxy (Vit-D Deficiency, Fractures) (Completed)   Osteoporosis - Primary   B12 deficiency anemia    Other Visit Diagnoses    Hyperlipidemia associated with type 2 diabetes mellitus ([place])        Relevant Orders    LDL cholesterol, direct (Completed)       I am having Cheyenne Torres maintain her docusate sodium, NON FORMULARY, cholecalciferol, aspirin, alendronate, fluticasone, vitamin B-12, Polyethyl Glycol-Propyl Glycol, L-Lysine HCl, Cranberry, glucose blood, tobramycin-dexamethasone, Blood Pressure Monitor, citalopram, promethazine, clobetasol ointment, ibuprofen, benzonatate, mirtazapine, fluconazole, nystatin-triamcinolone ointment, temazepam, ergocalciferol, atorvastatin, amLODipine, and losartan.  No orders of the defined types were placed in this encounter.    There are no discontinued medications.  Follow-up: No Follow-up on file.   Crecencio Mc, MD

## 2015-10-26 NOTE — Patient Instructions (Signed)
We are still waiting to hear from your insurance about [facility] authorization  See you in 3 months    I am glad you have gained 5 lbs!! Osteoporosis Osteoporosis happens when your bones become thinner and weaker. Weak bones can break (fracture) more easily when you slip or fall. Bones most at risk of breaking are in the hip, wrist, and spine. HOME CARE  Get enough calcium and vitamin D. These nutrients are good for your bones.  Exercise as told by your doctor.  Do not use any tobacco products. This includes cigarettes, chewing tobacco, and electronic cigarettes. If you need help quitting, ask your doctor.  Limit the amount of alcohol you drink.  Take medicines only as told by your doctor.  Keep all follow-up visits as told by your doctor. This is important.  Take care at home to prevent falls. Some ways to do this are:  Keep rooms well lit and tidy.  Put safety rails on your stairs.  Put a rubber mat in the bathroom and other places that are often wet or slippery. GET HELP RIGHT AWAY IF:  You fall.  You hurt yourself.   This information is not intended to replace advice given to you by your health care provider. Make sure you discuss any questions you have with your health care provider.   Document Released: 07/09/2011 Document Revised: 05/07/2014 Document Reviewed: 09/24/2013 Elsevier Interactive Patient Education [employer].

## 2015-10-28 ENCOUNTER — Encounter: Payer: Self-pay | Admitting: Internal Medicine

## 2015-10-29 NOTE — Assessment & Plan Note (Signed)
improving with daily exercise,  No recent falls.  

## 2015-10-29 NOTE — Assessment & Plan Note (Signed)
Proteinuria and improved with change to  losartan .  Lab Results  Component Value Date   CREATININE 0.74 10/26/2015   Lab Results  Component Value Date   NA 139 10/26/2015   K 4.1 10/26/2015   CL 104 10/26/2015   CO2 28 10/26/2015   Lab Results  Component Value Date   MICROALBUR 5.0* 04/18/2015

## 2015-10-29 NOTE — Assessment & Plan Note (Signed)
Managed with alendronate.  Given her age,  Recommending  Change in therapy to [place]

## 2015-10-29 NOTE — Assessment & Plan Note (Signed)
Currently well-controlled on current medications .  hemoglobin A1c is at goal of less than 7.0 . Patient is reminded to schedule an annual eye exam and foot exam is normal today. Patient has no microalbuminuria. Patient is tolerating statin therapy for CAD risk reduction and on ACE/ARB for renal protection and hypertension   Patient is up-to-date on eye exams and foot exam is normal today. Patient is tolerating aspirin, statin therapy for CAD risk reduction and on ACE/ARB for reduction in proteinuria.    Lab Results  Component Value Date   HGBA1C 7.1* 10/26/2015   Lab Results  Component Value Date   MICROALBUR 5.0* 04/18/2015

## 2015-10-29 NOTE — Assessment & Plan Note (Signed)
improved with celexa and mirtazipine

## 2015-11-02 NOTE — Telephone Encounter (Signed)
I have electronically submitted pt's info for Prolia insurance verification and will notify you once I have a response. Thank you. °

## 2015-11-02 NOTE — Telephone Encounter (Signed)
Noted thanks °

## 2015-11-21 NOTE — Telephone Encounter (Signed)
I have rec'd Cheyenne Torres's insurance verification for Prolia and her [place] plan is requiring a prior authorization.  I have faxed a most completed p/a form to your attn at [phone].  Please have Dr. Derrel Nip to complete section G on page one and continued on page two, then sign.  Once completed please fax back to my attn via [phone].    Thank you!

## 2015-11-22 NOTE — Telephone Encounter (Signed)
I have not received this fax, can you please resend? thanks

## 2015-11-29 NOTE — Telephone Encounter (Signed)
Received, and will send it back. thanks

## 2015-11-29 NOTE — Telephone Encounter (Signed)
I just re-faxed it, please let me know if you do not receive it.  Thank you!

## 2015-11-30 NOTE — Telephone Encounter (Signed)
Faxed back to [place], thanks

## 2016-01-10 NOTE — Telephone Encounter (Signed)
I have not rec'd anything from [facility] for Ms. Ebron's p/a on Prolia so I called [employer] to check the status.  Per Jonn at [facility] Ms. Buhman is approved for 2 times effective 12/29/2015-06/23/2016, Josem Kaufmann [id number].  She will have an estimated responsibility of $10.  Please make pt aware this is an estimate and we will not know an exact amt until insurance(s) has/have paid.  I have sent a copy of the summary of benefits to be scanned into pt's chart.    Once pt recs injection, please let me know actual injection date so I can update the Prolia portal.  If you have any questions, please let me know.  Thank you!

## 2016-01-16 ENCOUNTER — Other Ambulatory Visit: Payer: Self-pay | Admitting: Internal Medicine

## 2016-01-17 NOTE — Telephone Encounter (Signed)
Spoke with patient regarding prolia, she would like to receive it on Oct 5th at appt with PCP

## 2016-01-18 ENCOUNTER — Other Ambulatory Visit: Payer: Self-pay | Admitting: Internal Medicine

## 2016-01-22 ENCOUNTER — Other Ambulatory Visit: Payer: Self-pay | Admitting: Internal Medicine

## 2016-01-26 ENCOUNTER — Other Ambulatory Visit: Payer: Self-pay | Admitting: Internal Medicine

## 2016-01-30 ENCOUNTER — Other Ambulatory Visit: Payer: Self-pay | Admitting: Internal Medicine

## 2016-02-02 ENCOUNTER — Ambulatory Visit (INDEPENDENT_AMBULATORY_CARE_PROVIDER_SITE_OTHER): Payer: Medicare HMO | Admitting: Internal Medicine

## 2016-02-02 VITALS — BP 164/84 | HR 88 | Temp 97.7°F | Resp 10 | Ht 67.0 in | Wt 132.5 lb

## 2016-02-02 DIAGNOSIS — E1321 Other specified diabetes mellitus with diabetic nephropathy: Secondary | ICD-10-CM

## 2016-02-02 DIAGNOSIS — F320 Major depressive disorder, single episode, mild: Secondary | ICD-10-CM

## 2016-02-02 DIAGNOSIS — M810 Age-related osteoporosis without current pathological fracture: Secondary | ICD-10-CM

## 2016-02-02 DIAGNOSIS — E782 Mixed hyperlipidemia: Secondary | ICD-10-CM | POA: Diagnosis not present

## 2016-02-02 DIAGNOSIS — Z23 Encounter for immunization: Secondary | ICD-10-CM | POA: Diagnosis not present

## 2016-02-02 DIAGNOSIS — E559 Vitamin D deficiency, unspecified: Secondary | ICD-10-CM

## 2016-02-02 DIAGNOSIS — I10 Essential (primary) hypertension: Secondary | ICD-10-CM

## 2016-02-02 LAB — MICROALBUMIN / CREATININE URINE RATIO
Creatinine,U: 44.1 mg/dL
MICROALB UR: 11.6 mg/dL — AB (ref 0.0–1.9)
Microalb Creat Ratio: 26.3 mg/g (ref 0.0–30.0)

## 2016-02-02 LAB — COMPREHENSIVE METABOLIC PANEL
ALBUMIN: 4.3 g/dL (ref 3.5–5.2)
ALK PHOS: 78 U/L (ref 39–117)
ALT: 14 U/L (ref 0–35)
AST: 15 U/L (ref 0–37)
BUN: 22 mg/dL (ref 6–23)
CO2: 28 mEq/L (ref 19–32)
CREATININE: 0.9 mg/dL (ref 0.40–1.20)
Calcium: 9.4 mg/dL (ref 8.4–10.5)
Chloride: 101 mEq/L (ref 96–112)
GFR: 63.69 mL/min (ref >60.00)
Glucose, Bld: 163 mg/dL — ABNORMAL HIGH (ref 70–99)
POTASSIUM: 3.8 meq/L (ref 3.5–5.1)
SODIUM: 140 meq/L (ref 135–145)
TOTAL PROTEIN: 8.3 g/dL (ref 6.0–8.3)
Total Bilirubin: 0.4 mg/dL (ref 0.2–1.2)

## 2016-02-02 LAB — LDL CHOLESTEROL, DIRECT: LDL DIRECT: 67 mg/dL

## 2016-02-02 LAB — VITAMIN D 25 HYDROXY (VIT D DEFICIENCY, FRACTURES): VITD: 58.69 ng/mL (ref 30.00–100.00)

## 2016-02-02 LAB — LIPID PANEL
CHOL/HDL RATIO: 3
Cholesterol: 152 mg/dL (ref 0–200)
HDL: 56.6 mg/dL (ref >39.00)
NonHDL: 95.2
Triglycerides: 224 mg/dL — ABNORMAL HIGH (ref 0.0–149.0)
VLDL: 44.8 mg/dL — AB (ref 0.0–40.0)

## 2016-02-02 LAB — HEMOGLOBIN A1C: HEMOGLOBIN A1C: 8 % — AB (ref 4.6–6.5)

## 2016-02-02 MED ORDER — ALENDRONATE SODIUM 70 MG PO TABS: 70.0000 mg | ORAL_TABLET | ORAL | 1 refills | Status: DC

## 2016-02-02 NOTE — Progress Notes (Signed)
Pre-visit discussion using our clinic review tool. No additional management support is needed unless otherwise documented below in the visit note.  

## 2016-02-02 NOTE — Patient Instructions (Signed)
I'm thrilled with your weight gain and your improved energy level.  Going for a walk after breakfast will lower those blood sugars enough to avoid medication!  Your Prolia injection will only cost around $10.  We will do it in January  You can continue your Alendroate weekly until then

## 2016-02-02 NOTE — Progress Notes (Signed)
Subjective:  Patient ID: Cheyenne Torres, female    DOB: August 21, 1931  Age: 82 y.o. MRN: [id number]  CC: The primary encounter diagnosis was Mixed hyperlipidemia. Diagnoses of Essential hypertension, Other specified diabetes mellitus with diabetic nephropathy, without long-term current use of insulin ([place]), Vitamin D deficiency, Encounter for immunization, and Depression, major, single episode, mild ([place]) were also pertinent to this visit.  HPI Cheyenne Torres presents for follow  Up on type 2 DM,  Hyperlipidemia, ,and depression with weight loss, and fatigue..  AT her last visit she was prescribed Remeron for treatment of depression.   She is accompanied by son who reports a favorable response to the medication.  She has been more physically active,  Has gained several lbs due to increased appetite.   Fasting blood sugars have been persistently elevated from 140 to 170.  Does not check it at other times.     Lab Results  Component Value Date   HGBA1C 8.0 (H) 02/02/2016   AM sugars 130 to 170,   BPS normal    Outpatient Medications Prior to Visit  Medication Sig Dispense Refill  . amLODipine (NORVASC) 5 MG tablet TAKE ONE TABLET BY MOUTH ONCE DAILY 90 tablet 3  . aspirin 81 MG tablet Take 81 mg by mouth daily.      Marland Kitchen atorvastatin (LIPITOR) 20 MG tablet Take 1 tablet (20 mg total) by mouth daily. 90 tablet 3  . benzonatate (TESSALON) 100 MG capsule TAKE TWO CAPSULES BY MOUTH THREE TIMES DAILY AS NEEDED FOR COUGH 120 capsule 0  . Blood Pressure Monitor KIT Monitor BP once daily 1 each 0  . cholecalciferol (VITAMIN D) 1000 UNITS tablet Take 1,000 Units by mouth daily.      . citalopram (CELEXA) 20 MG tablet TAKE ONE TABLET BY MOUTH ONCE DAILY 90 tablet 0  . clobetasol ointment (TEMOVATE) 0.05 % APPLY TOPICALLY TO AFFECTED AREA TWICE DAILY 30 g 3  . Cranberry (CRANBERRY CONCENTRATE) 500 MG CAPS Take 1 capsule (500 mg total) by mouth daily. 90 each 0  . docusate sodium (COLACE) 100 MG  capsule Take 100 mg by mouth daily.      . ergocalciferol (DRISDOL) 50000 units capsule Take 1 capsule (50,000 Units total) by mouth once a week. 12 capsule 0  . fluconazole (DIFLUCAN) 150 MG tablet Take 1 tablet (150 mg total) by mouth daily. 2 tablet 0  . fluticasone (FLONASE) 50 MCG/ACT nasal spray Place 2 sprays into the nose daily. 16 g 6  . glucose blood (FREESTYLE LITE) test strip Use once daily to  check blood sugars   250.00  90 day supply 100 each 3  . ibuprofen (ADVIL,MOTRIN) 600 MG tablet Take 1 tablet (600 mg total) by mouth 3 (three) times daily as needed. 90 tablet 1  . L-Lysine HCl 1000 MG TABS Take by mouth as needed.    Marland Kitchen losartan (COZAAR) 100 MG tablet TAKE ONE TABLET BY MOUTH DAILY 30 tablet 5  . mirtazapine (REMERON) 30 MG tablet TAKE ONE TABLET BY MOUTH AT BEDTIME 90 tablet 1  . NON FORMULARY Fiber cap one daily     . nystatin-triamcinolone ointment (MYCOLOG) Apply 1 application topically 2 (two) times daily. To irritated skin 30 g 0  . Polyethyl Glycol-Propyl Glycol (SYSTANE ULTRA) 0.4-0.3 % SOLN Apply to eye daily.    . promethazine (PHENERGAN) 12.5 MG tablet Take 1 tablet (12.5 mg total) by mouth every 8 (eight) hours as needed. for nausea 20 tablet 0  .  temazepam (RESTORIL) 15 MG capsule TAKE ONE CAPSULE BY MOUTH AT BEDTIME AS NEEDED FOR  SLEEP 30 capsule 5  . tobramycin-dexamethasone (TOBRADEX) ophthalmic solution Place 1 drop into the left eye every 4 (four) hours while awake.    . vitamin B-12 (CYANOCOBALAMIN) 500 MCG tablet Take 500 mcg by mouth daily.    Marland Kitchen alendronate (FOSAMAX) 70 MG tablet Take 1 tablet (70 mg total) by mouth every 7 (seven) days. Take with a full glass of water on an empty stomach. 4 tablet 3   No facility-administered medications prior to visit.     Review of Systems;  Patient denies headache, fevers, malaise, unintentional weight loss, skin rash, eye pain, sinus congestion and sinus pain, sore throat, dysphagia,  hemoptysis , cough, dyspnea,  wheezing, chest pain, palpitations, orthopnea, edema, abdominal pain, nausea, melena, diarrhea, constipation, flank pain, dysuria, hematuria, urinary  Frequency, nocturia, numbness, tingling, seizures,  Focal weakness, Loss of consciousness,  Tremor, insomnia, depression, anxiety, and suicidal ideation.      Objective:  BP (!) 164/84   Pulse 88   Temp 97.7 F (36.5 C) (Oral)   Resp 10   Ht 5' 7"  (1.702 m)   Wt 132 lb 8 oz (60.1 kg)   SpO2 96%   BMI 20.75 kg/m   BP Readings from Last 3 Encounters:  02/02/16 (!) 164/84  10/26/15 124/70  07/18/15 132/76    Wt Readings from Last 3 Encounters:  02/02/16 132 lb 8 oz (60.1 kg)  10/26/15 126 lb (57.2 kg)  07/18/15 121 lb 12 oz (55.2 kg)    General appearance: alert, cooperative and appears stated age Ears: normal TM's and external ear canals both ears Throat: lips, mucosa, and tongue normal; teeth and gums normal Neck: no adenopathy, no carotid bruit, supple, symmetrical, trachea midline and thyroid not enlarged, symmetric, no tenderness/mass/nodules Back: symmetric, no curvature. ROM normal. No CVA tenderness. Lungs: clear to auscultation bilaterally Heart: regular rate and rhythm, S1, S2 normal, no murmur, click, rub or gallop Abdomen: soft, non-tender; bowel sounds normal; no masses,  no organomegaly Pulses: 2+ and symmetric Skin: Skin color, texture, turgor normal. No rashes or lesions Lymph nodes: Cervical, supraclavicular, and axillary nodes normal.  Lab Results  Component Value Date   HGBA1C 8.0 (H) 02/02/2016   HGBA1C 7.1 (H) 10/26/2015   HGBA1C 7.0 (H) 07/20/2015    Lab Results  Component Value Date   CREATININE 0.90 02/02/2016   CREATININE 0.74 10/26/2015   CREATININE 0.73 07/18/2015    Lab Results  Component Value Date   WBC 8.3 10/11/2014   HGB 11.7 (L) 10/11/2014   HCT 35.9 (L) 10/11/2014   PLT 267.0 10/11/2014   GLUCOSE 163 (H) 02/02/2016   CHOL 152 02/02/2016   TRIG 224.0 (H) 02/02/2016   HDL  56.60 02/02/2016   LDLDIRECT 67.0 02/02/2016   LDLCALC 64 10/26/2015   ALT 14 02/02/2016   AST 15 02/02/2016   NA 140 02/02/2016   K 3.8 02/02/2016   CL 101 02/02/2016   CREATININE 0.90 02/02/2016   BUN 22 02/02/2016   CO2 28 02/02/2016   TSH 4.88 (H) 12/29/2013   HGBA1C 8.0 (H) 02/02/2016   MICROALBUR 11.6 (H) 02/02/2016    Dg Bone Density  Result Date: 07/18/2015 EXAM: DUAL X-RAY ABSORPTIOMETRY (DXA) FOR BONE MINERAL DENSITY IMPRESSION: Dear Dr. Derrel Nip, Your patient Myrel Rappleye completed a BMD test on 07/18/2015 using the [place] (analysis version: 14.10) manufactured by [employer]. The following summarizes the results of  our evaluation. PATIENT BIOGRAPHICAL: Name: Hallel, Denherder Patient ID: [id number] Birth Date: Feb 19, 1932 Height: 66.0 in. Gender: Female Exam Date: 07/18/2015 Weight: 120.3 lbs. Indications: Advanced Age, Caucasian, Height Loss, History of Breast Cancer, Hysterectomy, Postmenopausal Fractures: Treatments: ASPRIN 81 MG, fosamax, Multi-Vitamin with calcium ASSESSMENT: The BMD measured at Femur Total Left is 0.673 g/cm2 with a T-score of -2.7. This patient is considered osteoporotic according to [facility]) criteria. Site Region Measured Measured WHO Young Adult BMD Date       Age      Classification T-score AP Spine L1-L4 9/18/2015 81.6 Osteopenia -1.1 1.058 g/cm2 AP Spine L1-L4 04/09/2007 73.3 Osteopenia -1.8 0.976 g/cm2 DualFemur Total Left 07/18/2015 81.6 Osteoporosis -2.7 0.673 g/cm2 DualFemur Total Left [birth year]09/2007 73.3 Osteoporosis -2.8 0.658 g/cm2 World Health Organization [facility]) criteria for post-menopausal, Caucasian Women: Normal:       T-score at or above -1 SD Osteopenia:   T-score between -1 and -2.5 SD Osteoporosis: T-score at or below -2.5 SD RECOMMENDATIONS: [employer] recommends that FDA-approved medical therapies be considered in postmenopausal women and men age 63 or older with a: 1. Hip or vertebral  (clinical or morphometric) fracture. 2. T-score of < -2.5 at the spine or hip. 3. Ten-year fracture probability by FRAX of 3% or greater for hip fracture or 20% or greater for major osteoporotic fracture. All treatment decisions require clinical judgment and consideration of individual patient factors, including patient preferences, co-morbidities, previous drug use, risk factors not captured in the FRAX model (e.g. falls, vitamin D deficiency, increased bone turnover, interval significant decline in bone density) and possible under - or over-estimation of fracture risk by FRAX. All patients should ensure an adequate intake of dietary calcium (1200 mg/d) and vitamin D (800 IU daily) unless contraindicated. FOLLOW-UP: People with diagnosed cases of osteoporosis or at high risk for fracture should have regular bone mineral density tests. For patients eligible for Medicare, routine testing is allowed once every 2 years. The testing frequency can be increased to one year for patients who have rapidly progressing disease, those who are receiving or discontinuing medical therapy to restore bone mass, or have additional risk factors. I have reviewed this report, and agree with the above findings. [facility] Electronically Signed   By: David  Martinique M.D.   On: 07/18/2015 10:20    Assessment & Plan:   Problem List Items Addressed This Visit    Hypertension     With Proteinuria .  Home BPs are normal.  Continue  losartan .  Lab Results  Component Value Date   CREATININE 0.90 02/02/2016   Lab Results  Component Value Date   NA 140 02/02/2016   K 3.8 02/02/2016   CL 101 02/02/2016   CO2 28 02/02/2016   Lab Results  Component Value Date   MICROALBUR 11.6 (H) 02/02/2016          Relevant Orders   Comprehensive metabolic panel (Completed)   Hyperlipidemia - Primary    Tolerating change to   higher potency statin.Marland Kitchen Marland KitchenTriglycerides are elevated today to over 300.  Recommended a low glycemic  index diet, weight  loss and regular exercise.  Advised t o  repeat in 6 months.   Lab Results  Component Value Date   CHOL 152 02/02/2016   HDL 56.60 02/02/2016   LDLCALC 64 10/26/2015   LDLDIRECT 67.0 02/02/2016   TRIG 224.0 (H) 02/02/2016   CHOLHDL 3 02/02/2016   Lab Results  Component Value Date   ALT  14 02/02/2016   AST 15 02/02/2016   ALKPHOS 78 02/02/2016   BILITOT 0.4 02/02/2016         Relevant Orders   LDL cholesterol, direct (Completed)   Lipid panel (Completed)   Depression, major, single episode, mild ([facility])    imporoed with addition of remeron.  No changes today .       Diabetes mellitus with diabetic nephropathy ([place])    Historically  well-controlled on iet alone,  Now requiring medicati n for A1c of 8.0 .   foot exam is normal today. Patient has  microalbuminuria.  Patient is up-to-date on eye exams and foot exam is normal today. Patient is tolerating aspirin, statin therapy for CAD risk reduction and on ACE/ARB for reduction in proteinuria.   Will add glipzide 2.5 mg twice daily   Lab Results  Component Value Date   HGBA1C 8.0 (H) 02/02/2016   Lab Results  Component Value Date   MICROALBUR 11.6 (H) 02/02/2016                            Relevant Medications   glipiZIDE (GLUCOTROL XL) 2.5 MG 24 hr tablet   Other Relevant Orders   Hemoglobin A1c (Completed)   Microalbumin / creatinine urine ratio (Completed)   Vitamin D deficiency    Resolved, currently,       Relevant Orders   VITAMIN D 25 Hydroxy (Vit-D Deficiency, Fractures) (Completed)    Other Visit Diagnoses    Encounter for immunization       Relevant Orders   Flu vaccine HIGH DOSE PF (Completed)      I am having Ms. Kofman start on glipiZIDE. I am also having her maintain her docusate sodium, NON FORMULARY, cholecalciferol, aspirin, fluticasone, vitamin B-12, Polyethyl Glycol-Propyl Glycol, L-Lysine HCl, Cranberry, glucose blood, tobramycin-dexamethasone, Blood  Pressure Monitor, citalopram, promethazine, clobetasol ointment, ibuprofen, benzonatate, fluconazole, nystatin-triamcinolone ointment, ergocalciferol, atorvastatin, amLODipine, losartan, temazepam, mirtazapine, and alendronate.  Meds ordered this encounter  Medications  . alendronate (FOSAMAX) 70 MG tablet    Sig: Take 1 tablet (70 mg total) by mouth every 7 (seven) days. Take with a full glass of water on an empty stomach.    Dispense:  4 tablet    Refill:  1  . glipiZIDE (GLUCOTROL XL) 2.5 MG 24 hr tablet    Sig: Take 1 tablet (2.5 mg total) by mouth daily with breakfast.    Dispense:  30 tablet    Refill:  5    Medications Discontinued During This Encounter  Medication Reason  . alendronate (FOSAMAX) 70 MG tablet Reorder    Follow-up: No Follow-up on file.   Crecencio Mc, MD

## 2016-02-04 ENCOUNTER — Encounter: Payer: Self-pay | Admitting: Internal Medicine

## 2016-02-04 MED ORDER — GLIPIZIDE ER 2.5 MG PO TB24: 2.5000 mg | ORAL_TABLET | Freq: Every day | ORAL | 5 refills | Status: DC

## 2016-02-04 NOTE — Assessment & Plan Note (Signed)
With Proteinuria .  Home BPs are normal.  Continue  losartan .  Lab Results  Component Value Date   CREATININE 0.90 02/02/2016   Lab Results  Component Value Date   NA 140 02/02/2016   K 3.8 02/02/2016   CL 101 02/02/2016   CO2 28 02/02/2016   Lab Results  Component Value Date   MICROALBUR 11.6 (H) 02/02/2016

## 2016-02-04 NOTE — Assessment & Plan Note (Signed)
Tolerating change to   higher potency statin.Marland Kitchen Marland KitchenTriglycerides are elevated today to over 300.  Recommended a low glycemic index diet, weight  loss and regular exercise.  Advised t o  repeat in 6 months.   Lab Results  Component Value Date   CHOL 152 02/02/2016   HDL 56.60 02/02/2016   LDLCALC 64 10/26/2015   LDLDIRECT 67.0 02/02/2016   TRIG 224.0 (H) 02/02/2016   CHOLHDL 3 02/02/2016   Lab Results  Component Value Date   ALT 14 02/02/2016   AST 15 02/02/2016   ALKPHOS 78 02/02/2016   BILITOT 0.4 02/02/2016

## 2016-02-04 NOTE — Assessment & Plan Note (Signed)
Historically  well-controlled on iet alone,  Now requiring medicati n for A1c of 8.0 .   foot exam is normal today. Patient has  microalbuminuria.  Patient is up-to-date on eye exams and foot exam is normal today. Patient is tolerating aspirin, statin therapy for CAD risk reduction and on ACE/ARB for reduction in proteinuria.   Will add glipzide 2.5 mg twice daily   Lab Results  Component Value Date   HGBA1C 8.0 (H) 02/02/2016   Lab Results  Component Value Date   MICROALBUR 11.6 (H) 02/02/2016

## 2016-02-04 NOTE — Assessment & Plan Note (Signed)
imporoed with addition of remeron.  No changes today .

## 2016-02-04 NOTE — Assessment & Plan Note (Signed)
Resolved, currently,

## 2016-02-04 NOTE — Assessment & Plan Note (Signed)
To be treated with Prolia.  Currently taking alendronate/

## 2016-02-06 ENCOUNTER — Telehealth: Payer: Self-pay | Admitting: Internal Medicine

## 2016-02-06 MED ORDER — GLIPIZIDE ER 2.5 MG PO TB24: 2.5000 mg | ORAL_TABLET | Freq: Every day | ORAL | 5 refills | Status: DC

## 2016-02-06 NOTE — Telephone Encounter (Signed)
Pt called and stated that she called her pharamcy to see if some diabetes medication was there for her and they said there was no. Pt stated that Dr. Derrel Nip was supposed to prescribe something for her. Please advise, thank you!  [place], [place] - [address] [address]  Call pt [phone]

## 2016-02-06 NOTE — Telephone Encounter (Signed)
Patient notified and script is at pharmacy

## 2016-02-08 ENCOUNTER — Telehealth: Payer: Self-pay | Admitting: Internal Medicine

## 2016-02-08 NOTE — Telephone Encounter (Signed)
Pt called and stated that she started glipiZIDE (GLUCOTROL XL) 2.5 MG 24 hr tablet yesterday and she is having a hard time breathing through her nose and also her head is a little dizzy. Please advise, thank you!  Call pt @ [phone]

## 2016-02-08 NOTE — Telephone Encounter (Signed)
Agree.  She has developed a viral infection , probably caught it in the office.    Suspend the glipizde until she is feeling better.   She can take Sudafed PE 10 mg every 6 hours for hte congestion and substitute AFrin nasal spray at bedtime if needed for sinus congestion  tylenol 650 mq 8 hrs for aches and pains,  Use OTC Delsym for  cough suppressant.   Advised to start antibiotic only if symptoms of bacterial sinusitis occur, and advised to take probiotic if the antibiotic is taken,  For a minimum of 3 weeks.

## 2016-02-08 NOTE — Telephone Encounter (Signed)
cbg = 132, just nose is stopped up really bad but patient has taken 2 doses of glipizide, last dose around 9.15 and started to feel dizzy and short of breathe at 1.45. Last 2 hours til drinking orange soda and she began to fill better advised to hold glipizide until call returned and anymore SOB and dizziness to call  Go to ED.

## 2016-02-16 ENCOUNTER — Telehealth: Payer: Self-pay | Admitting: *Deleted

## 2016-02-16 NOTE — Telephone Encounter (Signed)
Have her suspend the glipizide for 2 weeks and submit BS readings at the end

## 2016-02-16 NOTE — Telephone Encounter (Signed)
Spoke with the patient,she has been having trouble sleeping since she started the Glipizide after her 10/5 Office visit.  She will sleep for 1-2 hours and then she is wide awake for hte rest of the night.  This morning she also had a headache and felt bad.  Held the medication today, and took 2 ES tylenol.  The headache has gone away.  BS were 10/19 132 (this was after breakfast) , 10/18 120, 10/17 127, 10/16 132 and 10/15 119.  Please advise thanks

## 2016-02-16 NOTE — Telephone Encounter (Signed)
Patient stated that she's having trouble sleeping. She requested a call to consult her medications, she seems to think this  insomnia is coming from a new medication prescribed. Pt contact [phone]

## 2016-02-17 NOTE — Telephone Encounter (Signed)
Spoke with the patient, she will suspend the glipizide and check BS daily for two weeks and call the results to the office, thanks

## 2016-04-24 ENCOUNTER — Other Ambulatory Visit: Payer: Self-pay | Admitting: Internal Medicine

## 2016-05-01 ENCOUNTER — Telehealth: Payer: Self-pay | Admitting: Internal Medicine

## 2016-05-01 NOTE — Telephone Encounter (Signed)
Do we need to reschedule this  Shot?

## 2016-05-01 NOTE — Telephone Encounter (Signed)
Pt son called and had a question in regards to a Prolia Injection. She has an appt 05/11/16 and wants to know if we will have it when she come in. Please advise, thank you.  Call [place] @ [phone]

## 2016-05-01 NOTE — Telephone Encounter (Signed)
So because she waited till January, I have to resubmit and there is a PA needed, so I will do today.  The shot is in the fridge, but we have to have the PA back prior to her getting the injection.  So we can see how fast it gets done.

## 2016-05-03 NOTE — Telephone Encounter (Signed)
Notified patient son Simona Huh that we are working on approval for Prolia and should have an answer by early next week.

## 2016-05-04 NOTE — Telephone Encounter (Signed)
Patient son notified we have prolia and authorization.

## 2016-05-11 ENCOUNTER — Telehealth: Payer: Self-pay | Admitting: Cardiovascular Disease

## 2016-05-11 ENCOUNTER — Encounter: Payer: Self-pay | Admitting: Internal Medicine

## 2016-05-11 ENCOUNTER — Other Ambulatory Visit: Payer: Self-pay

## 2016-05-11 ENCOUNTER — Ambulatory Visit (INDEPENDENT_AMBULATORY_CARE_PROVIDER_SITE_OTHER): Payer: Medicare HMO | Admitting: Internal Medicine

## 2016-05-11 ENCOUNTER — Ambulatory Visit (INDEPENDENT_AMBULATORY_CARE_PROVIDER_SITE_OTHER): Payer: Medicare HMO | Admitting: Cardiovascular Disease

## 2016-05-11 ENCOUNTER — Encounter: Payer: Self-pay | Admitting: Cardiovascular Disease

## 2016-05-11 VITALS — BP 108/70 | HR 143 | Ht 67.0 in | Wt 129.0 lb

## 2016-05-11 VITALS — BP 122/76 | HR 104 | Temp 97.3°F | Resp 16 | Ht 67.0 in | Wt 128.2 lb

## 2016-05-11 DIAGNOSIS — M810 Age-related osteoporosis without current pathological fracture: Secondary | ICD-10-CM

## 2016-05-11 DIAGNOSIS — R000 Tachycardia, unspecified: Secondary | ICD-10-CM | POA: Diagnosis not present

## 2016-05-11 DIAGNOSIS — E782 Mixed hyperlipidemia: Secondary | ICD-10-CM | POA: Diagnosis not present

## 2016-05-11 DIAGNOSIS — I4891 Unspecified atrial fibrillation: Secondary | ICD-10-CM | POA: Diagnosis not present

## 2016-05-11 DIAGNOSIS — D508 Other iron deficiency anemias: Secondary | ICD-10-CM

## 2016-05-11 DIAGNOSIS — E1321 Other specified diabetes mellitus with diabetic nephropathy: Secondary | ICD-10-CM

## 2016-05-11 DIAGNOSIS — E559 Vitamin D deficiency, unspecified: Secondary | ICD-10-CM | POA: Diagnosis not present

## 2016-05-11 DIAGNOSIS — I10 Essential (primary) hypertension: Secondary | ICD-10-CM | POA: Diagnosis not present

## 2016-05-11 DIAGNOSIS — D518 Other vitamin B12 deficiency anemias: Secondary | ICD-10-CM

## 2016-05-11 LAB — LDL CHOLESTEROL, DIRECT: Direct LDL: 60 mg/dL

## 2016-05-11 LAB — CBC WITH DIFFERENTIAL/PLATELET
Basophils Absolute: 0 10*3/uL (ref 0.0–0.1)
Basophils Relative: 0.6 % (ref 0.0–3.0)
EOS PCT: 0.6 % (ref 0.0–5.0)
Eosinophils Absolute: 0 10*3/uL (ref 0.0–0.7)
HCT: 37.5 % (ref 36.0–46.0)
HEMOGLOBIN: 12.4 g/dL (ref 12.0–15.0)
LYMPHS ABS: 2.8 10*3/uL (ref 0.7–4.0)
Lymphocytes Relative: 34 % (ref 12.0–46.0)
MCHC: 33 g/dL (ref 30.0–36.0)
MCV: 85.4 fl (ref 78.0–100.0)
Monocytes Absolute: 0.3 10*3/uL (ref 0.1–1.0)
Monocytes Relative: 4.1 % (ref 3.0–12.0)
Neutro Abs: 5 10*3/uL (ref 1.4–7.7)
Neutrophils Relative %: 60.7 % (ref 43.0–77.0)
Platelets: 283 10*3/uL (ref 150.0–400.0)
RBC: 4.39 Mil/uL (ref 3.87–5.11)
RDW: 14.3 % (ref 11.5–15.5)
WBC: 8.3 10*3/uL (ref 4.0–10.5)

## 2016-05-11 LAB — VITAMIN D 25 HYDROXY (VIT D DEFICIENCY, FRACTURES): VITD: 26.02 ng/mL — ABNORMAL LOW (ref 30.00–100.00)

## 2016-05-11 LAB — COMPREHENSIVE METABOLIC PANEL
ALK PHOS: 73 U/L (ref 39–117)
ALT: 9 U/L (ref 0–35)
AST: 13 U/L (ref 0–37)
Albumin: 4.6 g/dL (ref 3.5–5.2)
BUN: 23 mg/dL (ref 6–23)
CHLORIDE: 105 meq/L (ref 96–112)
CO2: 27 meq/L (ref 19–32)
Calcium: 10.1 mg/dL (ref 8.4–10.5)
Creatinine, Ser: 0.89 mg/dL (ref 0.40–1.20)
GFR: 64.47 mL/min (ref >60.00)
GLUCOSE: 150 mg/dL — AB (ref 70–99)
POTASSIUM: 4.1 meq/L (ref 3.5–5.1)
Sodium: 142 mEq/L (ref 135–145)
TOTAL PROTEIN: 7.7 g/dL (ref 6.0–8.3)
Total Bilirubin: 0.5 mg/dL (ref 0.2–1.2)

## 2016-05-11 LAB — TSH: TSH: 4.13 u[IU]/mL (ref 0.35–4.50)

## 2016-05-11 LAB — PHOSPHORUS: Phosphorus: 3.3 mg/dL (ref 2.3–4.6)

## 2016-05-11 LAB — MAGNESIUM: Magnesium: 1.9 mg/dL (ref 1.5–2.5)

## 2016-05-11 LAB — HEMOGLOBIN A1C: Hgb A1c MFr Bld: 6.8 % — ABNORMAL HIGH (ref 4.6–6.5)

## 2016-05-11 MED ORDER — DENOSUMAB 60 MG/ML ~~LOC~~ SOLN
60.0000 mg | Freq: Once | SUBCUTANEOUS | Status: AC
  Administered 2016-05-11: 60 mg via SUBCUTANEOUS

## 2016-05-11 MED ORDER — METOPROLOL TARTRATE 25 MG PO TABS: 25.0000 mg | ORAL_TABLET | Freq: Two times a day (BID) | ORAL | 1 refills | Status: DC

## 2016-05-11 MED ORDER — DENOSUMAB 60 MG/ML ~~LOC~~ SOLN: 60.0000 mg | SUBCUTANEOUS | 0 refills | Status: AC

## 2016-05-11 MED ORDER — APIXABAN 2.5 MG PO TABS: 2.5000 mg | ORAL_TABLET | Freq: Two times a day (BID) | ORAL | 5 refills | Status: DC

## 2016-05-11 NOTE — Assessment & Plan Note (Signed)
Tolerating glipizide XR 2.5 mg daily without hypoglycemic events..   Not checking sugars. Continue asa ,sstatin   And ARB
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
## 2016-05-11 NOTE — Telephone Encounter (Addendum)
Eliquis prescribed at today's office visit. I left a message on [name] as she should not take aspirin. S/w pt son, Cheyenne Torres, who verbalized understanding for pt to stop taking 81mg  aspirin as she is now taking Eliquis. He will give the message to pt. Medication list updated

## 2016-05-11 NOTE — Assessment & Plan Note (Signed)
Managed with  higher potency statin.. Fasting labs are pending   Lab Results  Component Value Date   CHOL 152 02/02/2016   HDL 56.60 02/02/2016   LDLCALC 64 10/26/2015   LDLDIRECT 67.0 02/02/2016   TRIG 224.0 (H) 02/02/2016   CHOLHDL 3 02/02/2016   Lab Results  Component Value Date   ALT 14 02/02/2016   AST 15 02/02/2016   ALKPHOS 78 02/02/2016   BILITOT 0.4 02/02/2016

## 2016-05-11 NOTE — Assessment & Plan Note (Signed)
Treated with fosomax until now,  prolia started today .  injeciton given

## 2016-05-11 NOTE — Progress Notes (Signed)
Subjective:  Patient ID: Cheyenne Torres, female    DOB: Oct 28, 1931  Age: 83 y.o. MRN: [id number]  CC: The primary encounter diagnosis was Atrial fibrillation with rapid ventricular response ([place]). Diagnoses of Mixed hyperlipidemia, Other specified diabetes mellitus with diabetic nephropathy, without long-term current use of insulin ([place]), Vitamin D deficiency, Other vitamin B12 deficiency anemia, Other iron deficiency anemia, and Age-related osteoporosis without current pathological fracture were also pertinent to this visit.  HPI Cheyenne Torres presents for follow up  On type 2 DM , hyperlipidemia, and depression   Prolia injection given today for osteoporosis management.  Has been taking fosomax   Cc: anorexia,  4 lb weight loss since October  Tolerating glipizde    No lows.  Snacking between meals: apples,  Cookies  Seeing a different eye doctor July 12 2016 due  [place] [facility] was prior    Outpatient Medications Prior to Visit  Medication Sig Dispense Refill  . alendronate (FOSAMAX) 70 MG tablet Take 1 tablet (70 mg total) by mouth every 7 (seven) days. Take with a full glass of water on an empty stomach. 4 tablet 1  . aspirin 81 MG tablet Take 81 mg by mouth daily.      Marland Kitchen atorvastatin (LIPITOR) 20 MG tablet Take 1 tablet (20 mg total) by mouth daily. 90 tablet 3  . benzonatate (TESSALON) 100 MG capsule TAKE TWO CAPSULES BY MOUTH THREE TIMES DAILY AS NEEDED FOR COUGH 120 capsule 0  . Blood Pressure Monitor KIT Monitor BP once daily 1 each 0  . cholecalciferol (VITAMIN D) 1000 UNITS tablet Take 1,000 Units by mouth daily.      . citalopram (CELEXA) 20 MG tablet TAKE ONE TABLET BY MOUTH ONCE DAILY 90 tablet 0  . clobetasol ointment (TEMOVATE) 0.05 % APPLY TOPICALLY TO AFFECTED AREA TWICE DAILY 30 g 3  . Cranberry (CRANBERRY CONCENTRATE) 500 MG CAPS Take 1 capsule (500 mg total) by mouth daily. 90 each 0  . docusate sodium (COLACE) 100 MG capsule Take 100 mg by mouth daily.      .  ergocalciferol (DRISDOL) 50000 units capsule Take 1 capsule (50,000 Units total) by mouth once a week. 12 capsule 0  . fluconazole (DIFLUCAN) 150 MG tablet Take 1 tablet (150 mg total) by mouth daily. 2 tablet 0  . fluticasone (FLONASE) 50 MCG/ACT nasal spray Place 2 sprays into the nose daily. 16 g 6  . glipiZIDE (GLUCOTROL XL) 2.5 MG 24 hr tablet Take 1 tablet (2.5 mg total) by mouth daily with breakfast. 30 tablet 5  . glucose blood (FREESTYLE LITE) test strip Use once daily to  check blood sugars   250.00  90 day supply 100 each 3  . ibuprofen (ADVIL,MOTRIN) 600 MG tablet Take 1 tablet (600 mg total) by mouth 3 (three) times daily as needed. 90 tablet 1  . L-Lysine HCl 1000 MG TABS Take by mouth as needed.    Marland Kitchen losartan (COZAAR) 100 MG tablet TAKE ONE TABLET BY MOUTH DAILY 30 tablet 5  . mirtazapine (REMERON) 30 MG tablet TAKE ONE TABLET BY MOUTH AT BEDTIME 90 tablet 1  . NON FORMULARY Fiber cap one daily     . nystatin-triamcinolone ointment (MYCOLOG) Apply 1 application topically 2 (two) times daily. To irritated skin 30 g 0  . Polyethyl Glycol-Propyl Glycol (SYSTANE ULTRA) 0.4-0.3 % SOLN Apply to eye daily.    . promethazine (PHENERGAN) 12.5 MG tablet Take 1 tablet (12.5 mg total) by mouth every 8 (eight)  hours as needed. for nausea 20 tablet 0  . temazepam (RESTORIL) 15 MG capsule TAKE ONE CAPSULE BY MOUTH AT BEDTIME AS NEEDED FOR  SLEEP 30 capsule 5  . tobramycin-dexamethasone (TOBRADEX) ophthalmic solution Place 1 drop into the left eye every 4 (four) hours while awake.    . vitamin B-12 (CYANOCOBALAMIN) 500 MCG tablet Take 500 mcg by mouth daily.    Marland Kitchen amLODipine (NORVASC) 5 MG tablet TAKE ONE TABLET BY MOUTH ONCE DAILY 90 tablet 3   No facility-administered medications prior to visit.     Review of Systems;  Patient denies headache, fevers, malaise,skin rash, eye pain, sinus congestion and sinus pain, sore throat, dysphagia,  hemoptysis , cough, dyspnea, wheezing, chest pain,  palpitations, orthopnea, edema, abdominal pain, nausea, melena, diarrhea, constipation, flank pain, dysuria, hematuria, urinary  Frequency, nocturia, numbness, tingling, seizures,  Focal weakness, Loss of consciousness,  Tremor, insomnia, depression, anxiety, and suicidal ideation.      Objective:  BP 122/76   Pulse (!) 104   Temp 97.3 F (36.3 C) (Oral)   Resp 16   Ht 5' 7"  (1.702 m)   Wt 128 lb 4 oz (58.2 kg)   SpO2 96%   BMI 20.09 kg/m   BP Readings from Last 3 Encounters:  05/11/16 122/76  02/02/16 (!) 164/84  10/26/15 124/70    Wt Readings from Last 3 Encounters:  05/11/16 128 lb 4 oz (58.2 kg)  02/02/16 132 lb 8 oz (60.1 kg)  10/26/15 126 lb (57.2 kg)    General appearance: alert, cooperative and appears stated age Ears: normal TM's and external ear canals both ears Throat: lips, mucosa, and tongue normal; teeth and gums normal Neck: no adenopathy, no carotid bruit, supple, symmetrical, trachea midline and thyroid not enlarged, symmetric, no tenderness/mass/nodules Back: symmetric, no curvature. ROM normal. No CVA tenderness. Lungs: clear to auscultation bilaterally Heart: tachycardic,  irreg irreg, S1, S2 normal, no murmur, click, rub or gallop Abdomen: soft, non-tender; bowel sounds normal; no masses,  no organomegaly Pulses: 2+ and symmetric Skin: Skin color, texture, turgor normal. No rashes or lesions Lymph nodes: Cervical, supraclavicular, and axillary nodes normal.  Lab Results  Component Value Date   HGBA1C 8.0 (H) 02/02/2016   HGBA1C 7.1 (H) 10/26/2015   HGBA1C 7.0 (H) 07/20/2015    Lab Results  Component Value Date   CREATININE 0.90 02/02/2016   CREATININE 0.74 10/26/2015   CREATININE 0.73 07/18/2015    Lab Results  Component Value Date   WBC 8.3 10/11/2014   HGB 11.7 (L) 10/11/2014   HCT 35.9 (L) 10/11/2014   PLT 267.0 10/11/2014   GLUCOSE 163 (H) 02/02/2016   CHOL 152 02/02/2016   TRIG 224.0 (H) 02/02/2016   HDL 56.60 02/02/2016    LDLDIRECT 67.0 02/02/2016   LDLCALC 64 10/26/2015   ALT 14 02/02/2016   AST 15 02/02/2016   NA 140 02/02/2016   K 3.8 02/02/2016   CL 101 02/02/2016   CREATININE 0.90 02/02/2016   BUN 22 02/02/2016   CO2 28 02/02/2016   TSH 4.88 (H) 12/29/2013   HGBA1C 8.0 (H) 02/02/2016   MICROALBUR 11.6 (H) 02/02/2016    Dg Bone Density  Result Date: 07/18/2015 EXAM: DUAL X-RAY ABSORPTIOMETRY (DXA) FOR BONE MINERAL DENSITY IMPRESSION: Dear Dr. Derrel Nip, Your patient Liona Wengert completed a BMD test on 07/18/2015 using the [place] (analysis version: 14.10) manufactured by [employer]. The following summarizes the results of our evaluation. PATIENT BIOGRAPHICAL: Name: Karys, Meckley Patient ID:  [id number] Birth Date: Aug 22, 1931 Height: 66.0 in. Gender: Female Exam Date: 07/18/2015 Weight: 120.3 lbs. Indications: Advanced Age, Caucasian, Height Loss, History of Breast Cancer, Hysterectomy, Postmenopausal Fractures: Treatments: ASPRIN 81 MG, fosamax, Multi-Vitamin with calcium ASSESSMENT: The BMD measured at Femur Total Left is 0.673 g/cm2 with a T-score of -2.7. This patient is considered osteoporotic according to [facility]) criteria. Site Region Measured Measured WHO Young Adult BMD Date       Age      Classification T-score AP Spine L1-L4 9/18/2015 81.8 Osteopenia -1.1 1.058 g/cm2 AP Spine L1-L4 04/09/2007 73.3 Osteopenia -1.8 0.976 g/cm2 DualFemur Total Left 07/18/2015 81.6 Osteoporosis -2.7 0.673 g/cm2 DualFemur Total Left [birth year]09/2007 75.3 Osteoporosis -2.8 0.658 g/cm2 World Health Organization [place]) criteria for post-menopausal, Caucasian Women: Normal:       T-score at or above -1 SD Osteopenia:   T-score between -1 and -2.5 SD Osteoporosis: T-score at or below -2.5 SD RECOMMENDATIONS: [place] recommends that FDA-approved medical therapies be considered in postmenopausal women and men age 28 or older with a: 1. Hip or vertebral (clinical or  morphometric) fracture. 2. T-score of < -2.5 at the spine or hip. 3. Ten-year fracture probability by FRAX of 3% or greater for hip fracture or 20% or greater for major osteoporotic fracture. All treatment decisions require clinical judgment and consideration of individual patient factors, including patient preferences, co-morbidities, previous drug use, risk factors not captured in the FRAX model (e.g. falls, vitamin D deficiency, increased bone turnover, interval significant decline in bone density) and possible under - or over-estimation of fracture risk by FRAX. All patients should ensure an adequate intake of dietary calcium (1200 mg/d) and vitamin D (800 IU daily) unless contraindicated. FOLLOW-UP: People with diagnosed cases of osteoporosis or at high risk for fracture should have regular bone mineral density tests. For patients eligible for Medicare, routine testing is allowed once every 2 years. The testing frequency can be increased to one year for patients who have rapidly progressing disease, those who are receiving or discontinuing medical therapy to restore bone mass, or have additional risk factors. I have reviewed this report, and agree with the above findings. [facility] Radiology Electronically Signed   By: David  Martinique M.D.   On: 07/18/2015 10:20    Assessment & Plan:   Problem List Items Addressed This Visit    Atrial fibrillation with rapid ventricular response ([place]) - Primary    Confirmed by EKG done today. She is asymptomatic currently.  Starting metoprolol 25 mg bid,  Urgent cardiology referral made.  Has an appt this afternoon with Dr Fletcher Anon.  Amlodipine stopped to avoid hypotension.  TSH , lytes, ordered      Relevant Medications   metoprolol tartrate (LOPRESSOR) 25 MG tablet   Other Relevant Orders   EKG 12-Lead (Completed)   TSH   CBC with Differential/Platelet   Magnesium   B12 deficiency anemia   Diabetes mellitus with diabetic nephropathy (HCC)    Tolerating  glipizide XR 2.5 mg daily without hypoglycemic events..   Not checking sugars. Continue asa ,sstatin   And ARB      Relevant Orders   Hemoglobin A1c   Comprehensive metabolic panel   Hyperlipidemia    Managed with  higher potency statin.. Fasting labs are pending   Lab Results  Component Value Date   CHOL 152 02/02/2016   HDL 56.60 02/02/2016   LDLCALC 64 10/26/2015   LDLDIRECT 67.0 02/02/2016   TRIG 224.0 (H) 02/02/2016   CHOLHDL  3 02/02/2016   Lab Results  Component Value Date   ALT 14 02/02/2016   AST 15 02/02/2016   ALKPHOS 78 02/02/2016   BILITOT 0.4 02/02/2016         Relevant Medications   metoprolol tartrate (LOPRESSOR) 25 MG tablet   Other Relevant Orders   LDL cholesterol, direct   Iron deficiency anemia   Osteoporosis    Treated with fosomax until now,  prolia started today .  injeciton given       Vitamin D deficiency   Relevant Orders   Phosphorus   VITAMIN D 25 Hydroxy (Vit-D Deficiency, Fractures)    A total of 40 minutes was spent with patient more than half of which was spent in counseling patient on the above mentioned issues , reviewing and explaining recent labs and imaging studies done, and coordination of care.   I have discontinued Ms. Wiebelhaus's amLODipine. I am also having her start on metoprolol tartrate. Additionally, I am having her maintain her docusate sodium, NON FORMULARY, cholecalciferol, aspirin, fluticasone, vitamin B-12, Polyethyl Glycol-Propyl Glycol, L-Lysine HCl, Cranberry, glucose blood, tobramycin-dexamethasone, Blood Pressure Monitor, citalopram, promethazine, clobetasol ointment, ibuprofen, benzonatate, fluconazole, nystatin-triamcinolone ointment, ergocalciferol, atorvastatin, temazepam, mirtazapine, alendronate, glipiZIDE, and losartan.  Meds ordered this encounter  Medications  . metoprolol tartrate (LOPRESSOR) 25 MG tablet    Sig: Take 1 tablet (25 mg total) by mouth 2 (two) times daily.    Dispense:  60 tablet    Refill:   1    Medications Discontinued During This Encounter  Medication Reason  . amLODipine (NORVASC) 5 MG tablet     Follow-up: No Follow-up on file.   Crecencio Mc, MD

## 2016-05-11 NOTE — Addendum Note (Signed)
Addended by: Nanci Pina on: 05/11/2016 03:06 PM   Modules accepted: Orders

## 2016-05-11 NOTE — Patient Instructions (Addendum)
I AM sending you to see Dr Fletcher Anon this afternoon at 3:15 because your heart is in an irregular rhythm that is called atrial fibrillation  This rhythm can increase your risk of having a stroke and can stress out your heart  I am stopping your amlodipine because I want you to take metoprolol instead,  To lower your heart rate,  And it will also lower your blood pressure .  If you have already taken your amlodipine today,  You can reduce your first dose of metoprolol to 1/2 tablet

## 2016-05-11 NOTE — Assessment & Plan Note (Signed)
Confirmed by EKG done today. She is asymptomatic currently.  Starting metoprolol 25 mg bid,  Urgent cardiology referral made.  Has an appt this afternoon with Dr Fletcher Anon.  Amlodipine stopped to avoid hypotension.  TSH , lytes, ordered

## 2016-05-11 NOTE — Progress Notes (Signed)
Pre-visit discussion using our clinic review tool. No additional management support is needed unless otherwise documented below in the visit note.  

## 2016-05-11 NOTE — Patient Instructions (Addendum)
Medication Instructions:  Your physician has recommended you make the following change in your medication:  START taking eliquis 2.5mg  once daily   Labwork: none  Testing/Procedures: Your physician has requested that you have an echocardiogram in 3 weeks. Echocardiography is a painless test that uses sound waves to create images of your heart. It provides your doctor with information about the size and shape of your heart and how well your heart's chambers and valves are working. This procedure takes approximately one hour. There are no restrictions for this procedure.    Follow-Up: Your physician recommends that you schedule a follow-up appointment in: 3 weeks with Dr. Fletcher Anon.    Any Other Special Instructions Will Be Listed Below (If Applicable).     If you need a refill on your cardiac medications before your next appointment, please call your pharmacy.   Echocardiogram An echocardiogram, or echocardiography, uses sound waves (ultrasound) to produce an image of your heart. The echocardiogram is simple, painless, obtained within a short period of time, and offers valuable information to your health care provider. The images from an echocardiogram can provide information such as:  Evidence of coronary artery disease (CAD).  Heart size.  Heart muscle function.  Heart valve function.  Aneurysm detection.  Evidence of a past heart attack.  Fluid buildup around the heart.  Heart muscle thickening.  Assess heart valve function. Tell a health care provider about:  Any allergies you have.  All medicines you are taking, including vitamins, herbs, eye drops, creams, and over-the-counter medicines.  Any problems you or family members have had with anesthetic medicines.  Any blood disorders you have.  Any surgeries you have had.  Any medical conditions you have.  Whether you are pregnant or may be pregnant. What happens before the procedure? No special  preparation is needed. Eat and drink normally. What happens during the procedure?  In order to produce an image of your heart, gel will be applied to your chest and a wand-like tool (transducer) will be moved over your chest. The gel will help transmit the sound waves from the transducer. The sound waves will harmlessly bounce off your heart to allow the heart images to be captured in real-time motion. These images will then be recorded.  You may need an IV to receive a medicine that improves the quality of the pictures. What happens after the procedure? You may return to your normal schedule including diet, activities, and medicines, unless your health care provider tells you otherwise. This information is not intended to replace advice given to you by your health care provider. Make sure you discuss any questions you have with your health care provider. Document Released: 04/13/2000 Document Revised: 12/03/2015 Document Reviewed: 12/22/2012 Elsevier Interactive Patient Education  2017 [employer].  Atrial Fibrillation Introduction Atrial fibrillation is a type of heartbeat that is irregular or fast (rapid). If you have this condition, your heart keeps quivering in a weird (chaotic) way. This condition can make it so your heart cannot pump blood normally. Having this condition gives a person more risk for stroke, heart failure, and other heart problems. There are different types of atrial fibrillation. Talk with your doctor to learn about the type that you have. Follow these instructions at home:  Take over-the-counter and prescription medicines only as told by your doctor.  If your doctor prescribed a blood-thinning medicine, take it exactly as told. Taking too much of it can cause bleeding. If you do not take enough of it,  you will not have the protection that you need against stroke and other problems.  Do not use any tobacco products. These include cigarettes, chewing tobacco, and  e-cigarettes. If you need help quitting, ask your doctor.  If you have apnea (obstructive sleep apnea), manage it as told by your doctor.  Do not drink alcohol.  Do not drink beverages that have caffeine. These include coffee, soda, and tea.  Maintain a healthy weight. Do not use diet pills unless your doctor says they are safe for you. Diet pills may make heart problems worse.  Follow diet instructions as told by your doctor.  Exercise regularly as told by your doctor.  Keep all follow-up visits as told by your doctor. This is important. Contact a doctor if:  You notice a change in the speed, rhythm, or strength of your heartbeat.  You are taking a blood-thinning medicine and you notice more bruising.  You get tired more easily when you move or exercise. Get help right away if:  You have pain in your chest or your belly (abdomen).  You have sweating or weakness.  You feel sick to your stomach (nauseous).  You notice blood in your throw up (vomit), poop (stool), or pee (urine).  You are short of breath.  You suddenly have swollen feet and ankles.  You feel dizzy.  Your suddenly get weak or numb in your face, arms, or legs, especially if it happens on one side of your body.  You have trouble talking, trouble understanding, or both.  Your face or your eyelid droops on one side. These symptoms may be an emergency. Do not wait to see if the symptoms will go away. Get medical help right away. Call your local emergency services (911 in the [place] Do not drive yourself to the hospital.  This information is not intended to replace advice given to you by your health care provider. Make sure you discuss any questions you have with your health care provider. Document Released: 01/24/2008 Document Revised: 09/22/2015 Document Reviewed: 08/11/2014  2017 Elsevier Apixaban oral tablets What is this medicine? APIXABAN (a PIX a ban) is an anticoagulant (blood thinner). It is used to  lower the chance of stroke in people with a medical condition called atrial fibrillation. It is also used to treat or prevent blood clots in the lungs or in the veins. COMMON BRAND NAME(S): Eliquis What should I tell my health care provider before I take this medicine? They need to know if you have any of these conditions: -bleeding disorders -bleeding in the brain -blood in your stools (black or tarry stools) or if you have blood in your vomit -history of stomach bleeding -kidney disease -liver disease -mechanical heart valve -an unusual or allergic reaction to apixaban, other medicines, foods, dyes, or preservatives -pregnant or trying to get pregnant -breast-feeding How should I use this medicine? Take this medicine by mouth with a glass of water. Follow the directions on the prescription label. You can take it with or without food. If it upsets your stomach, take it with food. Take your medicine at regular intervals. Do not take it more often than directed. Do not stop taking except on your doctor's advice. Stopping this medicine may increase your risk of a blot clot. Be sure to refill your prescription before you run out of medicine. Talk to your pediatrician regarding the use of this medicine in children. Special care may be needed. What if I miss a dose? If you miss a dose,  take it as soon as you can. If it is almost time for your next dose, take only that dose. Do not take double or extra doses. What may interact with this medicine? This medicine may interact with the following: -aspirin and aspirin-like medicines -certain medicines for fungal infections like ketoconazole and itraconazole -certain medicines for seizures like carbamazepine and phenytoin -certain medicines that treat or prevent blood clots like warfarin, enoxaparin, and dalteparin -clarithromycin -NSAIDs, medicines for pain and inflammation, like ibuprofen or naproxen -rifampin -ritonavir -St. John's wort What  should I watch for while using this medicine? Notify your doctor or health care professional and seek emergency treatment if you develop breathing problems; changes in vision; chest pain; severe, sudden headache; pain, swelling, warmth in the leg; trouble speaking; sudden numbness or weakness of the face, arm, or leg. These can be signs that your condition has gotten worse. If you are going to have surgery, tell your doctor or health care professional that you are taking this medicine. Tell your health care professional that you use this medicine before you have a spinal or epidural procedure. Sometimes people who take this medicine have bleeding problems around the spine when they have a spinal or epidural procedure. This bleeding is very rare. If you have a spinal or epidural procedure while on this medicine, call your health care professional immediately if you have back pain, numbness or tingling (especially in your legs and feet), muscle weakness, paralysis, or loss of bladder or bowel control. Avoid sports and activities that might cause injury while you are using this medicine. Severe falls or injuries can cause unseen bleeding. Be careful when using sharp tools or knives. Consider using an Copy. Take special care brushing or flossing your teeth. Report any injuries, bruising, or red spots on the skin to your doctor or health care professional. What side effects may I notice from receiving this medicine? Side effects that you should report to your doctor or health care professional as soon as possible: -allergic reactions like skin rash, itching or hives, swelling of the face, lips, or tongue -signs and symptoms of bleeding such as bloody or black, tarry stools; red or dark-[place] urine; spitting up blood or Point material that looks like coffee grounds; red spots on the skin; unusual bruising or bleeding from the eye, gums, or nose Where should I keep my medicine? Keep out of the reach of  children. Store at room temperature between 20 and 25 degrees C (68 and 77 degrees F). Throw away any unused medicine after the expiration date.  2017 Elsevier/Gold Standard (2015-05-19 09:26:49)

## 2016-05-13 ENCOUNTER — Encounter: Payer: Self-pay | Admitting: Internal Medicine

## 2016-06-01 ENCOUNTER — Encounter: Payer: Self-pay | Admitting: Cardiovascular Disease

## 2016-06-01 ENCOUNTER — Other Ambulatory Visit: Payer: Self-pay

## 2016-06-01 ENCOUNTER — Ambulatory Visit (INDEPENDENT_AMBULATORY_CARE_PROVIDER_SITE_OTHER): Payer: Medicare HMO

## 2016-06-01 ENCOUNTER — Ambulatory Visit (INDEPENDENT_AMBULATORY_CARE_PROVIDER_SITE_OTHER): Payer: Medicare HMO | Admitting: Cardiovascular Disease

## 2016-06-01 VITALS — BP 150/84 | HR 121 | Ht 67.0 in | Wt 129.0 lb

## 2016-06-01 DIAGNOSIS — I480 Paroxysmal atrial fibrillation: Secondary | ICD-10-CM

## 2016-06-01 DIAGNOSIS — I4891 Unspecified atrial fibrillation: Secondary | ICD-10-CM

## 2016-06-01 DIAGNOSIS — I10 Essential (primary) hypertension: Secondary | ICD-10-CM

## 2016-06-01 MED ORDER — METOPROLOL TARTRATE 50 MG PO TABS: 50.0000 mg | ORAL_TABLET | Freq: Two times a day (BID) | ORAL | 3 refills | Status: DC

## 2016-06-01 NOTE — Patient Instructions (Signed)
Medication Instructions:  Your physician has recommended you make the following change in your medication:  INCREASE metoprolol to 50mg  twice daily   Labwork: none  Testing/Procedures: none  Follow-Up: Your physician recommends that you schedule a follow-up appointment in: 1 month with Dr. Fletcher Anon.    Any Other Special Instructions Will Be Listed Below (If Applicable).     If you need a refill on your cardiac medications before your next appointment, please call your pharmacy.
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
## 2016-06-26 ENCOUNTER — Other Ambulatory Visit: Payer: Self-pay | Admitting: Internal Medicine

## 2016-06-26 NOTE — Telephone Encounter (Signed)
Refilled on 07/20/2015 with 3 refills. Last office visit 05/11/2016. Pt does not have a follow up scheduled. Please advise.

## 2016-07-06 ENCOUNTER — Ambulatory Visit: Payer: Medicare HMO | Admitting: Cardiovascular Disease

## 2016-07-09 ENCOUNTER — Ambulatory Visit: Payer: Medicare HMO | Admitting: Cardiovascular Disease

## 2016-07-12 ENCOUNTER — Other Ambulatory Visit: Payer: Self-pay

## 2016-07-12 MED ORDER — GLIPIZIDE ER 2.5 MG PO TB24
2.5000 mg | ORAL_TABLET | Freq: Every day | ORAL | 1 refills | Status: DC
Start: 2016-07-12 — End: 2017-03-04

## 2016-07-13 ENCOUNTER — Ambulatory Visit (INDEPENDENT_AMBULATORY_CARE_PROVIDER_SITE_OTHER): Payer: Medicare HMO | Admitting: Nurse Practitioner

## 2016-07-13 ENCOUNTER — Other Ambulatory Visit: Payer: Self-pay | Admitting: Nurse Practitioner

## 2016-07-13 ENCOUNTER — Encounter: Payer: Self-pay | Admitting: Nurse Practitioner

## 2016-07-13 VITALS — BP 112/74 | HR 102 | Ht 67.0 in | Wt 121.5 lb

## 2016-07-13 DIAGNOSIS — I4891 Unspecified atrial fibrillation: Secondary | ICD-10-CM

## 2016-07-13 DIAGNOSIS — I481 Persistent atrial fibrillation: Secondary | ICD-10-CM

## 2016-07-13 DIAGNOSIS — I10 Essential (primary) hypertension: Secondary | ICD-10-CM | POA: Diagnosis not present

## 2016-07-13 DIAGNOSIS — I4819 Other persistent atrial fibrillation: Secondary | ICD-10-CM

## 2016-07-13 MED ORDER — METOPROLOL TARTRATE 75 MG PO TABS: 75.0000 mg | ORAL_TABLET | Freq: Two times a day (BID) | ORAL | 3 refills | Status: DC

## 2016-07-13 NOTE — Patient Instructions (Addendum)
Medication Instructions:  Please INCREASE your metoprolol to 75 mg twice daily  Labwork: BMET, CBC  Testing/Procedures: None  Follow-Up: 2-3 months w/ Dr. Fletcher Anon or Ignacia Bayley, NP  If you need a refill on your cardiac medications before your next appointment, please call your pharmacy.  Atrial Fibrillation Atrial fibrillation is a type of irregular or rapid heartbeat (arrhythmia). In atrial fibrillation, the heart quivers continuously in a chaotic pattern. This occurs when parts of the heart receive disorganized signals that make the heart unable to pump blood normally. This can increase the risk for stroke, heart failure, and other heart-related conditions. There are different types of atrial fibrillation, including:  Paroxysmal atrial fibrillation. This type starts suddenly, and it usually stops on its own shortly after it starts.  Persistent atrial fibrillation. This type often lasts longer than a week. It may stop on its own or with treatment.  Long-lasting persistent atrial fibrillation. This type lasts longer than 12 months.  Permanent atrial fibrillation. This type does not go away. Talk with your health care provider to learn about the type of atrial fibrillation that you have. What are the causes? This condition is caused by some heart-related conditions or procedures, including:  A heart attack.  Coronary artery disease.  Heart failure.  Heart valve conditions.  High blood pressure.  Inflammation of the sac that surrounds the heart (pericarditis).  Heart surgery.  Certain heart rhythm disorders, such as Wolf-Parkinson-White syndrome. Other causes include:  Pneumonia.  Obstructive sleep apnea.  Blockage of an artery in the lungs (pulmonary embolism, or PE).  Lung cancer.  Chronic lung disease.  Thyroid problems, especially if the thyroid is overactive (hyperthyroidism).  Caffeine.  Excessive alcohol use or illegal drug use.  Use of some medicines,  including certain decongestants and diet pills. Sometimes, the cause cannot be found. What increases the risk? This condition is more likely to develop in:  People who are older in age.  People who smoke.  People who have diabetes mellitus.  People who are overweight (obese).  Athletes who exercise vigorously. What are the signs or symptoms? Symptoms of this condition include:  A feeling that your heart is beating rapidly or irregularly.  A feeling of discomfort or pain in your chest.  Shortness of breath.  Sudden light-headedness or weakness.  Getting tired easily during exercise. In some cases, there are no symptoms. How is this diagnosed? Your health care provider may be able to detect atrial fibrillation when taking your pulse. If detected, this condition may be diagnosed with:  An electrocardiogram (ECG).  A Holter monitor test that records your heartbeat patterns over a 24-hour period.  Transthoracic echocardiogram (TTE) to evaluate how blood flows through your heart.  Transesophageal echocardiogram (TEE) to view more detailed images of your heart.  A stress test.  Imaging tests, such as a CT scan or chest X-ray.  Blood tests. How is this treated? The main goals of treatment are to prevent blood clots from forming and to keep your heart beating at a normal rate and rhythm. The type of treatment that you receive depends on many factors, such as your underlying medical conditions and how you feel when you are experiencing atrial fibrillation. This condition may be treated with:  Medicine to slow down the heart rate, bring the heart's rhythm back to normal, or prevent clots from forming.  Electrical cardioversion. This is a procedure that resets your heart's rhythm by delivering a controlled, low-energy shock to the heart through your skin.  Different types of ablation, such as catheter ablation, catheter ablation with pacemaker, or surgical ablation. These  procedures destroy the heart tissues that send abnormal signals. When the pacemaker is used, it is placed under your skin to help your heart beat in a regular rhythm. Follow these instructions at home:  Take over-the counter and prescription medicines only as told by your health care provider.  If your health care provider prescribed a blood-thinning medicine (anticoagulant), take it exactly as told. Taking too much blood-thinning medicine can cause bleeding. If you do not take enough blood-thinning medicine, you will not have the protection that you need against stroke and other problems.  Do not use tobacco products, including cigarettes, chewing tobacco, and e-cigarettes. If you need help quitting, ask your health care provider.  If you have obstructive sleep apnea, manage your condition as told by your health care provider.  Do not drink alcohol.  Do not drink beverages that contain caffeine, such as coffee, soda, and tea.  Maintain a healthy weight. Do not use diet pills unless your health care provider approves. Diet pills may make heart problems worse.  Follow diet instructions as told by your health care provider.  Exercise regularly as told by your health care provider.  Keep all follow-up visits as told by your health care provider. This is important. How is this prevented?  Avoid drinking beverages that contain caffeine or alcohol.  Avoid certain medicines, especially medicines that are used for breathing problems.  Avoid certain herbs and herbal medicines, such as those that contain ephedra or ginseng.  Do not use illegal drugs, such as cocaine and amphetamines.  Do not smoke.  Manage your high blood pressure. Contact a health care provider if:  You notice a change in the rate, rhythm, or strength of your heartbeat.  You are taking an anticoagulant and you notice increased bruising.  You tire more easily when you exercise or exert yourself. Get help right away  if:  You have chest pain, abdominal pain, sweating, or weakness.  You feel nauseous.  You notice blood in your vomit, bowel movement, or urine.  You have shortness of breath.  You suddenly have swollen feet and ankles.  You feel dizzy.  You have sudden weakness or numbness of the face, arm, or leg, especially on one side of the body.  You have trouble speaking, trouble understanding, or both (aphasia).  Your face or your eyelid droops on one side. These symptoms may represent a serious problem that is an emergency. Do not wait to see if the symptoms will go away. Get medical help right away. Call your local emergency services (911 in the [place] Do not drive yourself to the hospital. This information is not intended to replace advice given to you by your health care provider. Make sure you discuss any questions you have with your health care provider. Document Released: 04/16/2005 Document Revised: 08/24/2015 Document Reviewed: 08/11/2014 Elsevier Interactive Patient Education  2017 [employer].
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
## 2016-07-14 LAB — BASIC METABOLIC PANEL
BUN/Creatinine Ratio: 21 (ref 12–28)
BUN: 24 mg/dL (ref 8–27)
CALCIUM: 9.4 mg/dL (ref 8.7–10.3)
CHLORIDE: 101 mmol/L (ref 96–106)
CO2: 21 mmol/L (ref 18–29)
Creatinine, Ser: 1.14 mg/dL — ABNORMAL HIGH (ref 0.57–1.00)
GFR calc Af Amer: 52 mL/min/{1.73_m2} — ABNORMAL LOW (ref >59)
GFR calc non Af Amer: 45 mL/min/{1.73_m2} — ABNORMAL LOW (ref >59)
Glucose: 120 mg/dL — ABNORMAL HIGH (ref 65–99)
POTASSIUM: 4.1 mmol/L (ref 3.5–5.2)
Sodium: 143 mmol/L (ref 134–144)

## 2016-07-16 ENCOUNTER — Telehealth: Payer: Self-pay | Admitting: Cardiovascular Disease

## 2016-07-16 NOTE — Telephone Encounter (Signed)
Pharmacist asks if they can change pt Metoprolol, they only have 25 mg. Please call.

## 2016-07-16 NOTE — Telephone Encounter (Signed)
S/w [place] at [facility] who asks if metoprolol 75mg  BID prescription may be dispensed in 25mg  tablets as they do not stock 75mg . Pt will take 3 tablets (75mg  total) in the morning and 3 tablets in the evening.  Total of 180 tablets per 30 day supply. Prescription changed.

## 2016-07-17 LAB — CBC WITH DIFFERENTIAL/PLATELET
BASOS ABS: 0 10*3/uL (ref 0.0–0.2)
Basos: 1 %
EOS (ABSOLUTE): 0.1 10*3/uL (ref 0.0–0.4)
Eos: 1 %
Hematocrit: 36.1 % (ref 34.0–46.6)
Hemoglobin: 12.1 g/dL (ref 11.1–15.9)
Immature Grans (Abs): 0 10*3/uL (ref 0.0–0.1)
Immature Granulocytes: 0 %
Lymphocytes Absolute: 2.3 10*3/uL (ref 0.7–3.1)
Lymphs: 33 %
MCH: 27.6 pg (ref 26.6–33.0)
MCHC: 33.5 g/dL (ref 31.5–35.7)
MCV: 82 fL (ref 79–97)
MONOS ABS: 0.6 10*3/uL (ref 0.1–0.9)
Monocytes: 9 %
NEUTROS PCT: 56 %
Neutrophils Absolute: 3.8 10*3/uL (ref 1.4–7.0)
Platelets: 290 10*3/uL (ref 150–379)
RBC: 4.38 x10E6/uL (ref 3.77–5.28)
RDW: 14.7 % (ref 12.3–15.4)
WBC: 6.8 10*3/uL (ref 3.4–10.8)

## 2016-07-17 LAB — PLEASE NOTE

## 2016-07-17 NOTE — Telephone Encounter (Signed)
MD agreeable.

## 2016-07-19 ENCOUNTER — Other Ambulatory Visit: Payer: Self-pay | Admitting: Nurse Practitioner

## 2016-07-29 ENCOUNTER — Other Ambulatory Visit: Payer: Self-pay | Admitting: Internal Medicine

## 2016-08-21 ENCOUNTER — Other Ambulatory Visit: Payer: Self-pay | Admitting: Internal Medicine

## 2016-08-21 NOTE — Telephone Encounter (Signed)
Refilled: 01/23/16 Last OV: 05/11/16 Last Labs: 07/13/16 Future OV: none Please advise?

## 2016-08-23 ENCOUNTER — Other Ambulatory Visit: Payer: Self-pay

## 2016-08-30 ENCOUNTER — Other Ambulatory Visit: Payer: Self-pay

## 2016-08-30 DIAGNOSIS — E119 Type 2 diabetes mellitus without complications: Secondary | ICD-10-CM | POA: Diagnosis not present

## 2016-08-30 DIAGNOSIS — H524 Presbyopia: Secondary | ICD-10-CM | POA: Diagnosis not present

## 2016-08-30 MED ORDER — LOSARTAN POTASSIUM 100 MG PO TABS: 100.0000 mg | ORAL_TABLET | Freq: Every day | ORAL | 0 refills | Status: DC

## 2016-09-06 ENCOUNTER — Telehealth: Payer: Self-pay

## 2016-09-06 NOTE — Telephone Encounter (Signed)
Spoke to pt, declined to schedule AWV.

## 2016-09-06 NOTE — Telephone Encounter (Signed)
Patient is on the list for [employer] and may be a good candidate for an AWV. Please let me know if/when appt is scheduled.   

## 2016-10-11 ENCOUNTER — Encounter: Payer: Self-pay | Admitting: Cardiovascular Disease

## 2016-10-11 ENCOUNTER — Ambulatory Visit (INDEPENDENT_AMBULATORY_CARE_PROVIDER_SITE_OTHER): Payer: Medicare HMO | Admitting: Cardiovascular Disease

## 2016-10-11 VITALS — BP 130/80 | HR 87 | Ht 67.0 in | Wt 123.2 lb

## 2016-10-11 DIAGNOSIS — I10 Essential (primary) hypertension: Secondary | ICD-10-CM

## 2016-10-11 DIAGNOSIS — I482 Chronic atrial fibrillation, unspecified: Secondary | ICD-10-CM
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
## 2016-10-11 NOTE — Patient Instructions (Signed)
Medication Instructions: Continue same medications.   Labwork: None.   Procedures/Testing: None.   Follow-Up: 6 months with Dr. Pearline Yerby.   Any Additional Special Instructions Will Be Listed Below (If Applicable).     If you need a refill on your cardiac medications before your next appointment, please call your pharmacy.   

## 2016-11-12 ENCOUNTER — Ambulatory Visit (INDEPENDENT_AMBULATORY_CARE_PROVIDER_SITE_OTHER): Payer: Medicare HMO | Admitting: Internal Medicine

## 2016-11-12 ENCOUNTER — Encounter: Payer: Self-pay | Admitting: Internal Medicine

## 2016-11-12 VITALS — BP 162/92 | HR 136 | Temp 97.5°F | Resp 16 | Ht 67.0 in | Wt 128.6 lb

## 2016-11-12 DIAGNOSIS — E1321 Other specified diabetes mellitus with diabetic nephropathy: Secondary | ICD-10-CM

## 2016-11-12 DIAGNOSIS — I10 Essential (primary) hypertension: Secondary | ICD-10-CM | POA: Diagnosis not present

## 2016-11-12 DIAGNOSIS — R634 Abnormal weight loss: Secondary | ICD-10-CM | POA: Diagnosis not present

## 2016-11-12 DIAGNOSIS — E782 Mixed hyperlipidemia: Secondary | ICD-10-CM

## 2016-11-12 DIAGNOSIS — R29898 Other symptoms and signs involving the musculoskeletal system: Secondary | ICD-10-CM | POA: Diagnosis not present

## 2016-11-12 DIAGNOSIS — D508 Other iron deficiency anemias: Secondary | ICD-10-CM | POA: Diagnosis not present

## 2016-11-12 DIAGNOSIS — D518 Other vitamin B12 deficiency anemias: Secondary | ICD-10-CM

## 2016-11-12 LAB — COMPREHENSIVE METABOLIC PANEL
ALBUMIN: 4.5 g/dL (ref 3.5–5.2)
ALT: 13 U/L (ref 0–35)
AST: 16 U/L (ref 0–37)
Alkaline Phosphatase: 59 U/L (ref 39–117)
BUN: 19 mg/dL (ref 6–23)
CALCIUM: 9.9 mg/dL (ref 8.4–10.5)
CHLORIDE: 102 meq/L (ref 96–112)
CO2: 28 mEq/L (ref 19–32)
CREATININE: 0.96 mg/dL (ref 0.40–1.20)
GFR: 59 mL/min — AB (ref >60.00)
Glucose, Bld: 143 mg/dL — ABNORMAL HIGH (ref 70–99)
Potassium: 4.5 mEq/L (ref 3.5–5.1)
Sodium: 139 mEq/L (ref 135–145)
Total Bilirubin: 0.5 mg/dL (ref 0.2–1.2)
Total Protein: 7.9 g/dL (ref 6.0–8.3)

## 2016-11-12 LAB — CBC WITH DIFFERENTIAL/PLATELET
BASOS PCT: 0.8 % (ref 0.0–3.0)
Basophils Absolute: 0.1 10*3/uL (ref 0.0–0.1)
EOS ABS: 0.1 10*3/uL (ref 0.0–0.7)
Eosinophils Relative: 1.1 % (ref 0.0–5.0)
HEMATOCRIT: 37.7 % (ref 36.0–46.0)
Hemoglobin: 12.7 g/dL (ref 12.0–15.0)
LYMPHS PCT: 36.6 % (ref 12.0–46.0)
Lymphs Abs: 2.9 10*3/uL (ref 0.7–4.0)
MCHC: 33.6 g/dL (ref 30.0–36.0)
MCV: 85.6 fl (ref 78.0–100.0)
MONOS PCT: 5.4 % (ref 3.0–12.0)
Monocytes Absolute: 0.4 10*3/uL (ref 0.1–1.0)
NEUTROS ABS: 4.4 10*3/uL (ref 1.4–7.7)
Neutrophils Relative %: 56.1 % (ref 43.0–77.0)
PLATELETS: 230 10*3/uL (ref 150.0–400.0)
RBC: 4.41 Mil/uL (ref 3.87–5.11)
RDW: 15.9 % — AB (ref 11.5–15.5)
WBC: 7.9 10*3/uL (ref 4.0–10.5)

## 2016-11-12 LAB — LIPID PANEL
CHOLESTEROL: 133 mg/dL (ref 0–200)
HDL: 61.6 mg/dL (ref >39.00)
LDL CALC: 45 mg/dL (ref 0–99)
NonHDL: 71.07
TRIGLYCERIDES: 132 mg/dL (ref 0.0–149.0)
Total CHOL/HDL Ratio: 2
VLDL: 26.4 mg/dL (ref 0.0–40.0)

## 2016-11-12 LAB — VITAMIN B12: Vitamin B-12: 203 pg/mL — ABNORMAL LOW (ref 211–911)

## 2016-11-12 LAB — HEMOGLOBIN A1C: Hgb A1c MFr Bld: 6.6 % — ABNORMAL HIGH (ref 4.6–6.5)

## 2016-11-12 MED ORDER — METOPROLOL TARTRATE 50 MG PO TABS: 100.0000 mg | ORAL_TABLET | Freq: Two times a day (BID) | ORAL | 2 refills | Status: DC

## 2016-11-12 NOTE — Progress Notes (Signed)
Subjective:  Patient ID: Cheyenne Torres, female    DOB: 03-19-1932  Age: 83 y.o. MRN: [id number]  CC: The primary encounter diagnosis was Essential hypertension. Diagnoses of Mixed hyperlipidemia, Other specified diabetes mellitus with diabetic nephropathy, without long-term current use of insulin ([place]), Other vitamin B12 deficiency anemia, Other iron deficiency anemia, Proximal leg weakness, and Unintentional weight loss were also pertinent to this visit.  HPI Cheyenne Torres presents for follow up on chronic issues including 3 month follow up on diabetes.  Patient has no complaints today.  Patient is following a low glycemic index diet and taking all prescribed medications regularly without side effects.  Fasting sugars have been under less than 140 most of the time and post prandials have been under 160 except on rare occasions. Patient is exercising about 3 times per week and intentionally trying to lose weight .  Patient has had an eye exam in the last 12 months and checks feet regularly for signs of infection.  Patient does not walk barefoot outside,  And denies an numbness tingling or burning in feet. Patient is up to date on all recommended vaccinations  Hr at home  Has been in the 70's.  Last eye exam June 2018  In [place].  Treated for dry eye.  C/o dry mouth aggravated by drinking water   Lab Results  Component Value Date   HGBA1C 6.6 (H) 11/12/2016   Lab Results  Component Value Date   MICROALBUR 11.6 (H) 02/02/2016     Outpatient Medications Prior to Visit  Medication Sig Dispense Refill  . apixaban (ELIQUIS) 2.5 MG TABS tablet Take 1 tablet (2.5 mg total) by mouth 2 (two) times daily. 60 tablet 5  . atorvastatin (LIPITOR) 20 MG tablet TAKE ONE TABLET BY MOUTH DAILY 90 tablet 3  . Blood Pressure Monitor KIT Monitor BP once daily 1 each 0  . Cholecalciferol (VITAMIN D) 2000 units CAPS Take 2,000 Units by mouth daily.    Marland Kitchen denosumab (PROLIA) 60 MG/ML SOLN injection Inject  60 mg into the skin every 6 (six) months. Administer in upper arm, thigh, or abdomen 1 mL 0  . glipiZIDE (GLUCOTROL XL) 2.5 MG 24 hr tablet Take 1 tablet (2.5 mg total) by mouth daily with breakfast. 90 tablet 1  . glucose blood (FREESTYLE LITE) test strip Use once daily to  check blood sugars   250.00  90 day supply 100 each 3  . losartan (COZAAR) 100 MG tablet Take 1 tablet (100 mg total) by mouth daily. 90 tablet 0  . mirtazapine (REMERON) 30 MG tablet TAKE ONE TABLET BY MOUTH AT BEDTIME 90 tablet 1  . temazepam (RESTORIL) 15 MG capsule TAKE ONE CAPSULE BY MOUTH AT BEDTIME AS NEEDED FOR SLEEP 30 capsule 5  . docusate sodium (COLACE) 100 MG capsule Take 100 mg by mouth daily.      . fluticasone (FLONASE) 50 MCG/ACT nasal spray Place 2 sprays into the nose daily. (Patient not taking: Reported on 11/12/2016) 16 g 6  . L-Lysine HCl 1000 MG TABS Take by mouth as needed.    . metoprolol 75 MG TABS Take 75 mg by mouth 2 (two) times daily. 60 tablet 3  . NON FORMULARY Fiber cap one daily     . Polyethyl Glycol-Propyl Glycol (SYSTANE ULTRA) 0.4-0.3 % SOLN Apply to eye daily.     No facility-administered medications prior to visit.     Review of Systems;  Patient denies headache, fevers, malaise, unintentional weight loss,  skin rash, eye pain, sinus congestion and sinus pain, sore throat, dysphagia,  hemoptysis , cough, dyspnea, wheezing, chest pain, palpitations, orthopnea, edema, abdominal pain, nausea, melena, diarrhea, constipation, flank pain, dysuria, hematuria, urinary  Frequency, nocturia, numbness, tingling, seizures,  Focal weakness, Loss of consciousness,  Tremor, insomnia, depression, anxiety, and suicidal ideation.      Objective:  BP (!) 162/92 (BP Location: Left Arm, Patient Position: Sitting, Cuff Size: Normal)   Pulse (!) 136   Temp (!) 97.5 F (36.4 C) (Oral)   Resp 16   Ht 5' 7"  (1.702 m)   Wt 128 lb 9.6 oz (58.3 kg)   SpO2 98%   BMI 20.14 kg/m   BP Readings from Last 3  Encounters:  11/12/16 (!) 162/92  10/11/16 130/80  07/13/16 112/74    Wt Readings from Last 3 Encounters:  11/12/16 128 lb 9.6 oz (58.3 kg)  10/11/16 123 lb 4 oz (55.9 kg)  07/13/16 121 lb 8 oz (55.1 kg)    General appearance: alert, cooperative and appears stated age Ears: normal TM's and external ear canals both ears Throat: lips, mucosa, and tongue normal; teeth and gums normal Neck: no adenopathy, no carotid bruit, supple, symmetrical, trachea midline and thyroid not enlarged, symmetric, no tenderness/mass/nodules Back: symmetric, no curvature. ROM normal. No CVA tenderness. Lungs: clear to auscultation bilaterally Heart: regular rate and rhythm, S1, S2 normal, no murmur, click, rub or gallop Abdomen: soft, non-tender; bowel sounds normal; no masses,  no organomegaly Pulses: 2+ and symmetric Skin: Skin color, texture, turgor normal. No rashes or lesions Lymph nodes: Cervical, supraclavicular, and axillary nodes normal.  Lab Results  Component Value Date   HGBA1C 6.6 (H) 11/12/2016   HGBA1C 6.8 (H) 05/11/2016   HGBA1C 8.0 (H) 02/02/2016    Lab Results  Component Value Date   CREATININE 0.96 11/12/2016   CREATININE 1.14 (H) 07/13/2016   CREATININE 0.89 05/11/2016    Lab Results  Component Value Date   WBC 7.9 11/12/2016   HGB 12.7 11/12/2016   HCT 37.7 11/12/2016   PLT 230.0 11/12/2016   GLUCOSE 143 (H) 11/12/2016   CHOL 133 11/12/2016   TRIG 132.0 11/12/2016   HDL 61.60 11/12/2016   LDLDIRECT 60.0 05/11/2016   LDLCALC 45 11/12/2016   ALT 13 11/12/2016   AST 16 11/12/2016   NA 139 11/12/2016   K 4.5 11/12/2016   CL 102 11/12/2016   CREATININE 0.96 11/12/2016   BUN 19 11/12/2016   CO2 28 11/12/2016   TSH 4.13 05/11/2016   HGBA1C 6.6 (H) 11/12/2016   MICROALBUR 11.6 (H) 02/02/2016      Assessment & Plan:   Problem List Items Addressed This Visit    B12 deficiency anemia    Level is low again,  will resume weekly injections       Relevant Orders     B12 (Completed)   Diabetes mellitus with diabetic nephropathy (HCC)    Currently well-controlled on current medications .  hemoglobin A1c is at goal of less than 7.0 . Patient is reminded to schedule an annual eye exam and foot exam is normal today. Patient has no microalbuminuria. Patient is tolerating statin therapy for CAD risk reduction and on ACE/ARB for renal protection and hypertension .  Lab Results  Component Value Date   HGBA1C 6.6 (H) 11/12/2016   Lab Results  Component Value Date   CHOL 133 11/12/2016   HDL 61.60 11/12/2016   LDLCALC 45 11/12/2016   LDLDIRECT 60.0 05/11/2016  TRIG 132.0 11/12/2016   CHOLHDL 2 11/12/2016        Relevant Orders   Hemoglobin A1c (Completed)   Comprehensive metabolic panel (Completed)   Hyperlipidemia    Managed with  higher potency statin.. Fasting labs are pending   Lab Results  Component Value Date   CHOL 133 11/12/2016   HDL 61.60 11/12/2016   LDLCALC 45 11/12/2016   LDLDIRECT 60.0 05/11/2016   TRIG 132.0 11/12/2016   CHOLHDL 2 11/12/2016   Lab Results  Component Value Date   ALT 13 11/12/2016   AST 16 11/12/2016   ALKPHOS 59 11/12/2016   BILITOT 0.5 11/12/2016         Relevant Medications   metoprolol tartrate (LOPRESSOR) 50 MG tablet   Other Relevant Orders   Lipid panel (Completed)   Hypertension - Primary   Relevant Medications   metoprolol tartrate (LOPRESSOR) 50 MG tablet   Iron deficiency anemia   Relevant Orders   CBC with Differential/Platelet (Completed)   Proximal leg weakness    improving with daily exercise,  No recent falls.       Unintentional weight loss     I have reviewed her diet and recommended that she increase her protein and fat intake while monitoring her carbohydrates.           I have discontinued Ms. Balderston's docusate sodium, NON FORMULARY, fluticasone, Polyethyl Glycol-Propyl Glycol, L-Lysine HCl, Metoprolol Tartrate, and metoprolol tartrate. I am also having her start on  metoprolol tartrate. Additionally, I am having her maintain her glucose blood, Blood Pressure Monitor, denosumab, apixaban, Vitamin D, atorvastatin, glipiZIDE, mirtazapine, temazepam, losartan, and Biotin.  Meds ordered this encounter  Medications  . DISCONTD: metoprolol tartrate (LOPRESSOR) 25 MG tablet    Sig: Take 25 mg by mouth.   . Biotin 5 MG CAPS    Sig: Take by mouth daily.  . metoprolol tartrate (LOPRESSOR) 50 MG tablet    Sig: Take 2 tablets (100 mg total) by mouth 2 (two) times daily.    Dispense:  60 tablet    Refill:  2    Medications Discontinued During This Encounter  Medication Reason  . docusate sodium (COLACE) 100 MG capsule Patient has not taken in last 30 days  . fluticasone (FLONASE) 50 MCG/ACT nasal spray Patient has not taken in last 30 days  . NON FORMULARY Patient has not taken in last 30 days  . Polyethyl Glycol-Propyl Glycol (SYSTANE ULTRA) 0.4-0.3 % SOLN Patient has not taken in last 30 days  . L-Lysine HCl 1000 MG TABS Patient has not taken in last 30 days  . metoprolol 75 MG TABS   . metoprolol tartrate (LOPRESSOR) 25 MG tablet     Follow-up: Return in about 4 months (around 03/15/2017).   Crecencio Mc, MD

## 2016-11-12 NOTE — Progress Notes (Signed)
Intentionally blank

## 2016-11-12 NOTE — Patient Instructions (Addendum)
    I have Increased  Your metoprolol dose to 100 mg twice daily   That's 4 of the 25 mg  Tablets   The NEW rx is for 50 mg tablets so you will only have to take 2 twice daily    You need to drink 3   1 6  ounce servings of water or water based drinks daily    You have lost weight again.  Try making milkshakes using ice cream and [employer]'s protein shake

## 2016-11-13 ENCOUNTER — Telehealth: Payer: Self-pay | Admitting: Internal Medicine

## 2016-11-13 ENCOUNTER — Encounter: Payer: Self-pay | Admitting: Internal Medicine

## 2016-11-13 NOTE — Assessment & Plan Note (Signed)
improving with daily exercise,  No recent falls.

## 2016-11-13 NOTE — Telephone Encounter (Signed)
Her B12 is low and needs replacement with weekly injections for 3 weeks,  Then monthly    Please call the office to arrange these nurse visits.

## 2016-11-13 NOTE — Assessment & Plan Note (Signed)
Level is low again,  will resume weekly injections

## 2016-11-13 NOTE — Assessment & Plan Note (Signed)
I have reviewed her diet and recommended that she increase her protein and fat intake while monitoring her carbohydrates.  

## 2016-11-13 NOTE — Assessment & Plan Note (Signed)
Managed with  higher potency statin.. Fasting labs are pending   Lab Results  Component Value Date   CHOL 133 11/12/2016   HDL 61.60 11/12/2016   LDLCALC 45 11/12/2016   LDLDIRECT 60.0 05/11/2016   TRIG 132.0 11/12/2016   CHOLHDL 2 11/12/2016   Lab Results  Component Value Date   ALT 13 11/12/2016   AST 16 11/12/2016   ALKPHOS 59 11/12/2016   BILITOT 0.5 11/12/2016

## 2016-11-13 NOTE — Assessment & Plan Note (Addendum)
Currently well-controlled on current medications .  hemoglobin A1c is at goal of less than 7.0 . Patient is reminded to schedule an annual eye exam and foot exam is normal today. Patient has no microalbuminuria. Patient is tolerating statin therapy for CAD risk reduction and on ACE/ARB for renal protection and hypertension .  Lab Results  Component Value Date   HGBA1C 6.6 (H) 11/12/2016   Lab Results  Component Value Date   CHOL 133 11/12/2016   HDL 61.60 11/12/2016   LDLCALC 45 11/12/2016   LDLDIRECT 60.0 05/11/2016   TRIG 132.0 11/12/2016   CHOLHDL 2 11/12/2016

## 2016-11-14 ENCOUNTER — Telehealth: Payer: Self-pay | Admitting: *Deleted

## 2016-11-14 NOTE — Telephone Encounter (Signed)
See duplicate message.  ?

## 2016-11-14 NOTE — Telephone Encounter (Signed)
LMTCB

## 2016-11-14 NOTE — Telephone Encounter (Signed)
Spoke with pt and she stated that she would like to have her vitamin b12 sent to [facility]. She stated that there is a nurse that works there that is willing to give the pt her injections so she doesn't have to drive up here once a week.

## 2016-11-14 NOTE — Telephone Encounter (Signed)
Pt requested to have her vitamin B injections called into [facility] in [facility]

## 2016-11-15 MED ORDER — CYANOCOBALAMIN 1000 MCG/ML IJ SOLN: INTRAMUSCULAR | 2 refills | Status: DC

## 2016-11-15 NOTE — Telephone Encounter (Signed)
rx for injectable [facility] [place].  Weekly x 3,  Then monthly

## 2016-11-19 ENCOUNTER — Other Ambulatory Visit: Payer: Self-pay | Admitting: Cardiovascular Disease

## 2016-12-05 NOTE — Telephone Encounter (Signed)
Spoke with pt and explained to her that she should have gotten an injection once a week for 3 weeks and then once a month until she comes back in the office for a follow up with Dr. Derrel Nip and she checks her lab work again. Scheduled the pt an office visit with Dr. Derrel Nip in November. Pt is aware of appt date and time.

## 2016-12-05 NOTE — Telephone Encounter (Signed)
Pt lvm requesting a call back regarding her b12 injections. She has questions regarding how many she needs to take before she follows up. Pt cb [phone]

## 2016-12-17 ENCOUNTER — Telehealth: Payer: Self-pay

## 2016-12-17 NOTE — Telephone Encounter (Signed)
Spoke with the patient regarding her Prolia injection that is due, patient verbalizes understanding and would like to wait until her appt in November to receive the injection as it is too much to drive here between now and then.  I explained that she will be months behind now, but she is fine with waiting till that appt in November. thanks

## 2017-01-01 ENCOUNTER — Other Ambulatory Visit: Payer: Self-pay | Admitting: Internal Medicine

## 2017-01-25 DIAGNOSIS — R69 Illness, unspecified: Secondary | ICD-10-CM | POA: Diagnosis not present

## 2017-01-28 ENCOUNTER — Other Ambulatory Visit: Payer: Self-pay | Admitting: Internal Medicine

## 2017-02-03 ENCOUNTER — Other Ambulatory Visit: Payer: Self-pay | Admitting: Internal Medicine

## 2017-02-17 ENCOUNTER — Other Ambulatory Visit: Payer: Self-pay | Admitting: Internal Medicine

## 2017-03-04 ENCOUNTER — Other Ambulatory Visit: Payer: Self-pay | Admitting: Internal Medicine

## 2017-03-18 ENCOUNTER — Encounter: Payer: Self-pay | Admitting: Internal Medicine

## 2017-03-18 ENCOUNTER — Ambulatory Visit (INDEPENDENT_AMBULATORY_CARE_PROVIDER_SITE_OTHER): Payer: Medicare HMO | Admitting: Internal Medicine

## 2017-03-18 VITALS — BP 160/80 | HR 90 | Temp 97.5°F | Resp 15 | Ht 67.0 in | Wt 134.4 lb

## 2017-03-18 DIAGNOSIS — M810 Age-related osteoporosis without current pathological fracture: Secondary | ICD-10-CM | POA: Diagnosis not present

## 2017-03-18 DIAGNOSIS — E559 Vitamin D deficiency, unspecified: Secondary | ICD-10-CM

## 2017-03-18 DIAGNOSIS — E1321 Other specified diabetes mellitus with diabetic nephropathy: Secondary | ICD-10-CM | POA: Diagnosis not present

## 2017-03-18 DIAGNOSIS — E538 Deficiency of other specified B group vitamins: Secondary | ICD-10-CM | POA: Diagnosis not present

## 2017-03-18 DIAGNOSIS — E782 Mixed hyperlipidemia: Secondary | ICD-10-CM | POA: Diagnosis not present

## 2017-03-18 LAB — LIPID PANEL
CHOL/HDL RATIO: 3
Cholesterol: 126 mg/dL (ref 0–200)
HDL: 49.8 mg/dL (ref >39.00)
LDL CALC: 38 mg/dL (ref 0–99)
NONHDL: 76.37
TRIGLYCERIDES: 194 mg/dL — AB (ref 0.0–149.0)
VLDL: 38.8 mg/dL (ref 0.0–40.0)

## 2017-03-18 LAB — COMPREHENSIVE METABOLIC PANEL
ALK PHOS: 77 U/L (ref 39–117)
ALT: 12 U/L (ref 0–35)
AST: 15 U/L (ref 0–37)
Albumin: 4.2 g/dL (ref 3.5–5.2)
BILIRUBIN TOTAL: 0.6 mg/dL (ref 0.2–1.2)
BUN: 18 mg/dL (ref 6–23)
CALCIUM: 9.8 mg/dL (ref 8.4–10.5)
CO2: 30 meq/L (ref 19–32)
Chloride: 100 mEq/L (ref 96–112)
Creatinine, Ser: 1.03 mg/dL (ref 0.40–1.20)
GFR: 54.35 mL/min — AB (ref >60.00)
GLUCOSE: 188 mg/dL — AB (ref 70–99)
POTASSIUM: 3.9 meq/L (ref 3.5–5.1)
Sodium: 137 mEq/L (ref 135–145)
TOTAL PROTEIN: 7.6 g/dL (ref 6.0–8.3)

## 2017-03-18 LAB — HEMOGLOBIN A1C: HEMOGLOBIN A1C: 6.8 % — AB (ref 4.6–6.5)

## 2017-03-18 LAB — VITAMIN D 25 HYDROXY (VIT D DEFICIENCY, FRACTURES): VITD: 36.27 ng/mL (ref 30.00–100.00)

## 2017-03-18 MED ORDER — ZOSTER VAC RECOMB ADJUVANTED 50 MCG/0.5ML IM SUSR: 0.5000 mL | Freq: Once | INTRAMUSCULAR | 1 refills | Status: AC

## 2017-03-18 MED ORDER — DENOSUMAB 60 MG/ML ~~LOC~~ SOLN
60.0000 mg | Freq: Once | SUBCUTANEOUS | Status: AC
Start: 2017-03-18 — End: 2017-03-18
  Administered 2017-03-18: 60 mg via SUBCUTANEOUS

## 2017-03-18 NOTE — Patient Instructions (Addendum)
  i'm glad to see you have gained weight!      You received your osteoporosis treatment today (Prolia injection )  You can tell your granddaughter that you are up to date on your whooping cough vaccine   I'll see you in 6 months unless your a1c has rise to > 7.0 today    The ShingRx vaccine is now available in local pharmacies and is much more protective thant Zostavaxs,  It is therefore ADVISED for all interested adults over 50 to prevent shingles

## 2017-03-18 NOTE — Progress Notes (Signed)
Subjective:  Patient ID: Cheyenne Torres, female    DOB: Dec 15, 1931  Age: 83 y.o. MRN: [id number]  CC: The primary encounter diagnosis was B12 deficiency. Diagnoses of Vitamin D deficiency, Mixed hyperlipidemia, Other specified diabetes mellitus with diabetic nephropathy, without long-term current use of insulin ([place]), and Age-related osteoporosis without current pathological fracture were also pertinent to this visit.  HPI [name] presents for follow up on hyperlipidemia,  Type 2 diabetes. Weight loss /depression  Patient does not check blood sugars more than once a month,  Last one was 135 a month ago in a fasting state.  Does not recall any above 200 lr less than 80.  No complaints today.  Taking her medications as directed,  Not walking daily in the house .  Patient voices awareness  of the foods he/she needs to avoid,  And follows a low GI diet about 50% of the time.  Has had an annual diabetic eye exam.  Denies numbness and tingling in lower extremities.  Denies hypoglycemic symptoms.   She has gained 6 lbs since last visit   [place]  Lab Results  Component Value Date   HGBA1C 6.8 (H) 03/18/2017     Outpatient Medications Prior to Visit  Medication Sig Dispense Refill  . atorvastatin (LIPITOR) 20 MG tablet TAKE ONE TABLET BY MOUTH DAILY 90 tablet 3  . Biotin 5 MG CAPS Take by mouth daily.    . Blood Pressure Monitor KIT Monitor BP once daily 1 each 0  . Cholecalciferol (VITAMIN D) 2000 units CAPS Take 2,000 Units by mouth daily.    . cyanocobalamin (,VITAMIN B-12,) 1000 MCG/ML injection 1 ml injected IM weekly x 3,  Then monthly thereafter 10 mL 2  . denosumab (PROLIA) 60 MG/ML SOLN injection Inject 60 mg into the skin every 6 (six) months. Administer in upper arm, thigh, or abdomen 1 mL 0  . ELIQUIS 2.5 MG TABS tablet TAKE ONE TABLET BY MOUTH TWICE DAILY 60 tablet 5  . glipiZIDE (GLUCOTROL XL) 2.5 MG 24 hr tablet TAKE 1 TABLET BY MOUTH DAILY  WITH BREAKFAST 90 tablet 1  . glucose blood (FREESTYLE LITE) test strip Use once daily to  check blood sugars   250.00  90 day supply 100 each 3  . losartan (COZAAR) 100 MG tablet Take 1 tablet (100 mg total) by mouth daily. 90 tablet 0  . losartan (COZAAR) 100 MG tablet TAKE ONE TABLET BY MOUTH ONCE DAILY 30 tablet 5  . metoprolol tartrate (LOPRESSOR) 50 MG tablet TAKE 2 TABLETS BY MOUTH TWICE DAILY 60 tablet 2  . mirtazapine (REMERON) 30 MG tablet TAKE 1 TABLET BY MOUTH ONCE DAILY AT BEDTIME 90 tablet 1  . temazepam (RESTORIL) 15 MG capsule TAKE 1 CAPSULE BY MOUTH AT BEDTIME AS NEEDED FOR SLEEP 30 capsule 5   No facility-administered medications prior to visit.     Review of Systems;  Patient denies headache, fevers, malaise, unintentional weight loss, skin rash, eye pain, sinus congestion and sinus pain, sore throat, dysphagia,  hemoptysis , cough, dyspnea, wheezing, chest pain, palpitations, orthopnea, edema, abdominal pain, nausea, melena, diarrhea, constipation, flank pain, dysuria, hematuria, urinary  Frequency, nocturia, numbness, tingling, seizures,  Focal weakness, Loss of consciousness,  Tremor, insomnia, depression, anxiety, and suicidal ideation.      Objective:  BP (!) 160/80 (BP Location: Left Arm, Patient Position: Sitting, Cuff Size: Normal)   Pulse 90   Temp (!) 97.5 F (36.4 C) (Oral)  Resp 15   Ht 5' 7"  (1.702 m)   Wt 134 lb 6.4 oz (61 kg)   SpO2 98%   BMI 21.05 kg/m   BP Readings from Last 3 Encounters:  03/18/17 (!) 160/80  11/12/16 (!) 162/92  10/11/16 130/80    Wt Readings from Last 3 Encounters:  03/18/17 134 lb 6.4 oz (61 kg)  11/12/16 128 lb 9.6 oz (58.3 kg)  10/11/16 123 lb 4 oz (55.9 kg)    General appearance: alert, cooperative and appears stated age Ears: normal TM's and external ear canals both ears Throat: lips, mucosa, and tongue normal; teeth and gums normal Neck: no adenopathy, no carotid bruit, supple, symmetrical, trachea midline and  thyroid not enlarged, symmetric, no tenderness/mass/nodules Back: symmetric, no curvature. ROM normal. No CVA tenderness. Lungs: clear to auscultation bilaterally Heart: regular rate and rhythm, S1, S2 normal, no murmur, click, rub or gallop Abdomen: soft, non-tender; bowel sounds normal; no masses,  no organomegaly Pulses: 2+ and symmetric Skin: Skin color, texture, turgor normal. No rashes or lesions Lymph nodes: Cervical, supraclavicular, and axillary nodes normal.  Lab Results  Component Value Date   HGBA1C 6.8 (H) 03/18/2017   HGBA1C 6.6 (H) 11/12/2016   HGBA1C 6.8 (H) 05/11/2016    Lab Results  Component Value Date   CREATININE 1.03 03/18/2017   CREATININE 0.96 11/12/2016   CREATININE 1.14 (H) 07/13/2016    Lab Results  Component Value Date   WBC 7.9 11/12/2016   HGB 12.7 11/12/2016   HCT 37.7 11/12/2016   PLT 230.0 11/12/2016   GLUCOSE 188 (H) 03/18/2017   CHOL 126 03/18/2017   TRIG 194.0 (H) 03/18/2017   HDL 49.80 03/18/2017   LDLDIRECT 60.0 05/11/2016   LDLCALC 38 03/18/2017   ALT 12 03/18/2017   AST 15 03/18/2017   NA 137 03/18/2017   K 3.9 03/18/2017   CL 100 03/18/2017   CREATININE 1.03 03/18/2017   BUN 18 03/18/2017   CO2 30 03/18/2017   TSH 4.13 05/11/2016   HGBA1C 6.8 (H) 03/18/2017   MICROALBUR 11.6 (H) 02/02/2016      Assessment & Plan:   Problem List Items Addressed This Visit    Diabetes mellitus with diabetic nephropathy ([place])    Currently well-controlled on current medications .  hemoglobin A1c is at goal of less than 7.0 . Patient is reminded to schedule an annual eye exam and foot exam is normal today. Patient has no microalbuminuria. Patient is tolerating statin therapy for CAD risk reduction and on ACE/ARB for renal protection and hypertension   Lab Results  Component Value Date   HGBA1C 6.8 (H) 03/18/2017   Lab Results  Component Value Date   MICROALBUR 11.6 (H) 02/02/2016         Relevant Orders   Hemoglobin A1c  (Completed)   Comprehensive metabolic panel (Completed)   Hyperlipidemia    Managed with  higher potency statin.Marland Kitchen LDL is at goal ahd LFTs are normal   Lab Results  Component Value Date   CHOL 126 03/18/2017   HDL 49.80 03/18/2017   LDLCALC 38 03/18/2017   LDLDIRECT 60.0 05/11/2016   TRIG 194.0 (H) 03/18/2017   CHOLHDL 3 03/18/2017   Lab Results  Component Value Date   ALT 12 03/18/2017   AST 15 03/18/2017   ALKPHOS 77 03/18/2017   BILITOT 0.6 03/18/2017         Relevant Orders   Lipid panel (Completed)   Osteoporosis    Treated with fosomax until  Recently,  Now receiving prolia. Next dose today       Relevant Medications   denosumab (PROLIA) injection 60 mg (Completed)   Vitamin D deficiency   Relevant Orders   VITAMIN D 25 Hydroxy (Vit-D Deficiency, Fractures) (Completed)    Other Visit Diagnoses    B12 deficiency    -  Primary   Relevant Orders   Intrinsic Factor Antibodies     A total of 25 minutes of face to face time was spent with patient more than half of which was spent in counselling about the above mentioned conditions  and coordination of care   I am having Reece C. Neff start on Zoster Vaccine Adjuvanted. I am also having her maintain her glucose blood, Blood Pressure Monitor, denosumab, Vitamin D, atorvastatin, losartan, Biotin, cyanocobalamin, ELIQUIS, mirtazapine, losartan, temazepam, metoprolol tartrate, and glipiZIDE. We administered denosumab.  Meds ordered this encounter  Medications  . Zoster Vaccine Adjuvanted [facility]) injection    Sig: Inject 0.5 mLs once for 1 dose into the muscle.    Dispense:  1 each    Refill:  1  . denosumab (PROLIA) injection 60 mg    There are no discontinued medications.  Follow-up: No Follow-up on file.   Crecencio Mc, MD

## 2017-03-19 ENCOUNTER — Encounter: Payer: Self-pay | Admitting: Internal Medicine

## 2017-03-19 NOTE — Assessment & Plan Note (Signed)
Currently well-controlled on current medications .  hemoglobin A1c is at goal of less than 7.0 . Patient is reminded to schedule an annual eye exam and foot exam is normal today. Patient has no microalbuminuria. Patient is tolerating statin therapy for CAD risk reduction and on ACE/ARB for renal protection and hypertension   Lab Results  Component Value Date   HGBA1C 6.8 (H) 03/18/2017   Lab Results  Component Value Date   MICROALBUR 11.6 (H) 02/02/2016

## 2017-03-19 NOTE — Assessment & Plan Note (Signed)
Treated with fosomax until  Recently,  Now receiving prolia. Next dose today

## 2017-03-19 NOTE — Assessment & Plan Note (Signed)
Managed with  higher potency statin.Marland Kitchen LDL is at goal ahd LFTs are normal   Lab Results  Component Value Date   CHOL 126 03/18/2017   HDL 49.80 03/18/2017   LDLCALC 38 03/18/2017   LDLDIRECT 60.0 05/11/2016   TRIG 194.0 (H) 03/18/2017   CHOLHDL 3 03/18/2017   Lab Results  Component Value Date   ALT 12 03/18/2017   AST 15 03/18/2017   ALKPHOS 77 03/18/2017   BILITOT 0.6 03/18/2017

## 2017-03-22 LAB — INTRINSIC FACTOR ANTIBODIES: INTRINSIC FACTOR: NEGATIVE

## 2017-03-23 ENCOUNTER — Encounter: Payer: Self-pay | Admitting: Internal Medicine

## 2017-04-03 ENCOUNTER — Other Ambulatory Visit: Payer: Self-pay | Admitting: Internal Medicine

## 2017-04-12 ENCOUNTER — Encounter: Payer: Self-pay | Admitting: Cardiovascular Disease

## 2017-04-12 ENCOUNTER — Ambulatory Visit: Payer: Medicare HMO | Admitting: Cardiovascular Disease

## 2017-04-12 VITALS — BP 130/80 | HR 85 | Ht 67.0 in | Wt 134.0 lb

## 2017-04-12 DIAGNOSIS — I10 Essential (primary) hypertension: Secondary | ICD-10-CM | POA: Diagnosis not present

## 2017-04-12 DIAGNOSIS — I482 Chronic atrial fibrillation, unspecified: Secondary | ICD-10-CM

## 2017-04-12 NOTE — Patient Instructions (Signed)
Medication Instructions: Continue same medications.   Labwork: None.   Procedures/Testing: None.   Follow-Up: 6 months with Dr. Nathanael Krist.   Any Additional Special Instructions Will Be Listed Below (If Applicable).     If you need a refill on your cardiac medications before your next appointment, please call your pharmacy.   
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
## 2017-05-17 ENCOUNTER — Other Ambulatory Visit: Payer: Self-pay | Admitting: Cardiovascular Disease

## 2017-05-17 ENCOUNTER — Other Ambulatory Visit: Payer: Self-pay | Admitting: Internal Medicine

## 2017-05-17 NOTE — Telephone Encounter (Signed)
Refill Request.  

## 2017-05-21 ENCOUNTER — Other Ambulatory Visit: Payer: Self-pay | Admitting: Internal Medicine

## 2017-07-19 ENCOUNTER — Other Ambulatory Visit: Payer: Self-pay | Admitting: Internal Medicine

## 2017-08-07 ENCOUNTER — Telehealth: Payer: Self-pay | Admitting: Internal Medicine

## 2017-08-07 DIAGNOSIS — M810 Age-related osteoporosis without current pathological fracture: Secondary | ICD-10-CM

## 2017-08-07 NOTE — Telephone Encounter (Signed)
Patient has to have another DEXA scan before her insurance will pay for another Prolia injection.  I have ordered . Please let patient know

## 2017-08-08 ENCOUNTER — Telehealth: Payer: Self-pay | Admitting: Internal Medicine

## 2017-08-08 ENCOUNTER — Encounter (INDEPENDENT_AMBULATORY_CARE_PROVIDER_SITE_OTHER): Payer: Self-pay

## 2017-08-08 ENCOUNTER — Encounter: Payer: Self-pay | Admitting: *Deleted

## 2017-08-08 NOTE — Telephone Encounter (Signed)
Faxed last Bone density to [employer].

## 2017-08-08 NOTE — Telephone Encounter (Signed)
Copied from [facility]. Topic: Quick Communication - See Telephone Encounter >> Aug 08, 2017 10:20 AM Boyd Kerbs wrote: CRM for notification.  Cindi Carbon [id number]  Ref # [id number] Prolia injection -needing latest  Done density report with T scores   See Telephone encounter for: 08/08/17.

## 2017-08-08 NOTE — Telephone Encounter (Signed)
Sent Mychart message.

## 2017-08-08 NOTE — Telephone Encounter (Signed)
Cheyenne Torres scheduled.

## 2017-08-19 ENCOUNTER — Other Ambulatory Visit: Payer: Self-pay | Admitting: Internal Medicine

## 2017-08-19 NOTE — Telephone Encounter (Signed)
Refilled: 02/18/2017 Last OV: 03/18/2017 Next OV: 10/14/2017

## 2017-08-21 NOTE — Telephone Encounter (Signed)
Printed, signed and faxed.  

## 2017-09-04 ENCOUNTER — Ambulatory Visit
Admission: RE | Admit: 2017-09-04 | Discharge: 2017-09-04 | Disposition: A | Discharge status: Home / Self Care | Payer: Medicare HMO | Source: Ambulatory Visit | Attending: Internal Medicine | Admitting: Internal Medicine

## 2017-09-04 DIAGNOSIS — M810 Age-related osteoporosis without current pathological fracture: Secondary | ICD-10-CM | POA: Insufficient documentation

## 2017-09-04 DIAGNOSIS — M8589 Other specified disorders of bone density and structure, multiple sites: Secondary | ICD-10-CM | POA: Insufficient documentation

## 2017-09-04 DIAGNOSIS — Z780 Asymptomatic menopausal state: Secondary | ICD-10-CM | POA: Diagnosis not present

## 2017-09-09 ENCOUNTER — Telehealth: Payer: Self-pay | Admitting: Internal Medicine

## 2017-09-09 NOTE — Telephone Encounter (Signed)
Patient scheduled for 09/19/17 for Prolia injection.

## 2017-09-10 ENCOUNTER — Other Ambulatory Visit: Payer: Self-pay | Admitting: Internal Medicine

## 2017-09-10 NOTE — Telephone Encounter (Signed)
Pt calling and wants to know if she can have her Prolia injection in June when she comes in for her appointment, or if that will be to long for her to wait. Please advise.

## 2017-09-10 NOTE — Telephone Encounter (Signed)
Spoke with pt and she stated that she lives [place] hour and half away and would like to wait until her appt with Dr. Derrel Nip in June to get the prolia injection. Spoke with Juliann Pulse, RN and she stated that would be okay. Nurse appt on 09/19/2017 has been canceled.

## 2017-09-10 NOTE — Telephone Encounter (Signed)
Please advise 

## 2017-09-19 ENCOUNTER — Ambulatory Visit: Payer: Medicare HMO

## 2017-10-01 ENCOUNTER — Encounter: Payer: Self-pay | Admitting: Nurse Practitioner

## 2017-10-01 ENCOUNTER — Ambulatory Visit: Payer: Medicare HMO | Admitting: Nurse Practitioner

## 2017-10-01 VITALS — BP 120/84 | HR 87 | Ht 67.0 in | Wt 125.8 lb

## 2017-10-01 DIAGNOSIS — I481 Persistent atrial fibrillation: Secondary | ICD-10-CM

## 2017-10-01 DIAGNOSIS — I10 Essential (primary) hypertension: Secondary | ICD-10-CM

## 2017-10-01 DIAGNOSIS — Z79899 Other long term (current) drug therapy: Secondary | ICD-10-CM

## 2017-10-01 DIAGNOSIS — I4819 Other persistent atrial fibrillation: Secondary | ICD-10-CM
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
## 2017-10-01 NOTE — Patient Instructions (Signed)
Medication Instructions: - Your physician recommends that you continue on your current medications as directed. Please refer to the Current Medication list given to you today.  Labwork: -Your physician recommends that you have lab work today: BMP/ CBC  Procedures/Testing: - none ordered  Follow-Up: - Your physician wants you to follow-up in: 6 months with Dr. Fletcher Anon. You will receive a reminder letter in the mail two months in advance. If you don't receive a letter, please call our office to schedule the follow-up appointment.   Any Additional Special Instructions Will Be Listed Below (If Applicable).     If you need a refill on your cardiac medications before your next appointment, please call your pharmacy.

## 2017-10-02 LAB — BASIC METABOLIC PANEL
BUN / CREAT RATIO: 21 (ref 12–28)
BUN: 23 mg/dL (ref 8–27)
CHLORIDE: 102 mmol/L (ref 96–106)
CO2: 22 mmol/L (ref 20–29)
Calcium: 10 mg/dL (ref 8.7–10.3)
Creatinine, Ser: 1.07 mg/dL — ABNORMAL HIGH (ref 0.57–1.00)
GFR calc non Af Amer: 48 mL/min/{1.73_m2} — ABNORMAL LOW (ref >59)
GFR, EST AFRICAN AMERICAN: 55 mL/min/{1.73_m2} — AB (ref >59)
GLUCOSE: 126 mg/dL — AB (ref 65–99)
POTASSIUM: 4.7 mmol/L (ref 3.5–5.2)
SODIUM: 141 mmol/L (ref 134–144)

## 2017-10-02 LAB — CBC WITH DIFFERENTIAL/PLATELET
BASOS ABS: 0 10*3/uL (ref 0.0–0.2)
Basos: 0 %
EOS (ABSOLUTE): 0.1 10*3/uL (ref 0.0–0.4)
Eos: 1 %
HEMOGLOBIN: 11.8 g/dL (ref 11.1–15.9)
Hematocrit: 37.3 % (ref 34.0–46.6)
Immature Grans (Abs): 0 10*3/uL (ref 0.0–0.1)
Immature Granulocytes: 0 %
LYMPHS ABS: 3 10*3/uL (ref 0.7–3.1)
Lymphs: 34 %
MCH: 28.4 pg (ref 26.6–33.0)
MCHC: 31.6 g/dL (ref 31.5–35.7)
MCV: 90 fL (ref 79–97)
MONOS ABS: 0.5 10*3/uL (ref 0.1–0.9)
Monocytes: 6 %
NEUTROS ABS: 5.1 10*3/uL (ref 1.4–7.0)
Neutrophils: 59 %
Platelets: 238 10*3/uL (ref 150–450)
RBC: 4.16 x10E6/uL (ref 3.77–5.28)
RDW: 14.8 % (ref 12.3–15.4)
WBC: 8.7 10*3/uL (ref 3.4–10.8)

## 2017-10-08 ENCOUNTER — Ambulatory Visit: Payer: Medicare HMO | Admitting: Physician Assistant

## 2017-10-11 ENCOUNTER — Ambulatory Visit: Payer: Medicare HMO | Admitting: Cardiovascular Disease

## 2017-10-14 ENCOUNTER — Ambulatory Visit (INDEPENDENT_AMBULATORY_CARE_PROVIDER_SITE_OTHER): Payer: Medicare HMO | Admitting: Internal Medicine

## 2017-10-14 ENCOUNTER — Encounter: Payer: Self-pay | Admitting: Internal Medicine

## 2017-10-14 VITALS — BP 120/76 | HR 76 | Temp 97.7°F | Resp 15 | Ht 67.0 in | Wt 126.6 lb

## 2017-10-14 DIAGNOSIS — K8010 Calculus of gallbladder with chronic cholecystitis without obstruction: Secondary | ICD-10-CM

## 2017-10-14 DIAGNOSIS — E559 Vitamin D deficiency, unspecified: Secondary | ICD-10-CM | POA: Diagnosis not present

## 2017-10-14 DIAGNOSIS — D126 Benign neoplasm of colon, unspecified: Secondary | ICD-10-CM

## 2017-10-14 DIAGNOSIS — F320 Major depressive disorder, single episode, mild: Secondary | ICD-10-CM

## 2017-10-14 DIAGNOSIS — R1906 Epigastric swelling, mass or lump: Secondary | ICD-10-CM | POA: Diagnosis not present

## 2017-10-14 DIAGNOSIS — R29898 Other symptoms and signs involving the musculoskeletal system: Secondary | ICD-10-CM

## 2017-10-14 DIAGNOSIS — R5383 Other fatigue: Secondary | ICD-10-CM | POA: Diagnosis not present

## 2017-10-14 DIAGNOSIS — M810 Age-related osteoporosis without current pathological fracture: Secondary | ICD-10-CM

## 2017-10-14 DIAGNOSIS — R69 Illness, unspecified: Secondary | ICD-10-CM | POA: Diagnosis not present

## 2017-10-14 DIAGNOSIS — E039 Hypothyroidism, unspecified: Secondary | ICD-10-CM

## 2017-10-14 DIAGNOSIS — E1321 Other specified diabetes mellitus with diabetic nephropathy: Secondary | ICD-10-CM

## 2017-10-14 DIAGNOSIS — D508 Other iron deficiency anemias: Secondary | ICD-10-CM | POA: Diagnosis not present

## 2017-10-14 LAB — MICROALBUMIN / CREATININE URINE RATIO
Creatinine,U: 26.6 mg/dL
Microalb Creat Ratio: 14.8 mg/g (ref 0.0–30.0)
Microalb, Ur: 3.9 mg/dL — ABNORMAL HIGH (ref 0.0–1.9)

## 2017-10-14 LAB — CBC WITH DIFFERENTIAL/PLATELET
BASOS PCT: 0.6 % (ref 0.0–3.0)
Basophils Absolute: 0 10*3/uL (ref 0.0–0.1)
EOS PCT: 1 % (ref 0.0–5.0)
Eosinophils Absolute: 0.1 10*3/uL (ref 0.0–0.7)
HCT: 35.5 % — ABNORMAL LOW (ref 36.0–46.0)
HEMOGLOBIN: 12.2 g/dL (ref 12.0–15.0)
Lymphocytes Relative: 38.2 % (ref 12.0–46.0)
Lymphs Abs: 3.2 10*3/uL (ref 0.7–4.0)
MCHC: 34.4 g/dL (ref 30.0–36.0)
MCV: 87.8 fl (ref 78.0–100.0)
MONO ABS: 0.5 10*3/uL (ref 0.1–1.0)
MONOS PCT: 6.4 % (ref 3.0–12.0)
Neutro Abs: 4.6 10*3/uL (ref 1.4–7.7)
Neutrophils Relative %: 53.8 % (ref 43.0–77.0)
Platelets: 238 10*3/uL (ref 150.0–400.0)
RBC: 4.04 Mil/uL (ref 3.87–5.11)
RDW: 13.6 % (ref 11.5–15.5)
WBC: 8.5 10*3/uL (ref 4.0–10.5)

## 2017-10-14 LAB — HEMOGLOBIN A1C: Hgb A1c MFr Bld: 6.6 % — ABNORMAL HIGH (ref 4.6–6.5)

## 2017-10-14 LAB — COMPREHENSIVE METABOLIC PANEL
ALBUMIN: 4.4 g/dL (ref 3.5–5.2)
ALK PHOS: 67 U/L (ref 39–117)
ALT: 11 U/L (ref 0–35)
AST: 14 U/L (ref 0–37)
BILIRUBIN TOTAL: 0.7 mg/dL (ref 0.2–1.2)
BUN: 26 mg/dL — ABNORMAL HIGH (ref 6–23)
CALCIUM: 10.4 mg/dL (ref 8.4–10.5)
CO2: 28 mEq/L (ref 19–32)
Chloride: 101 mEq/L (ref 96–112)
Creatinine, Ser: 0.94 mg/dL (ref 0.40–1.20)
GFR: 60.32 mL/min (ref >60.00)
GLUCOSE: 106 mg/dL — AB (ref 70–99)
Potassium: 4.1 mEq/L (ref 3.5–5.1)
Sodium: 139 mEq/L (ref 135–145)
TOTAL PROTEIN: 7.8 g/dL (ref 6.0–8.3)

## 2017-10-14 LAB — VITAMIN D 25 HYDROXY (VIT D DEFICIENCY, FRACTURES): VITD: 39.45 ng/mL (ref 30.00–100.00)

## 2017-10-14 LAB — TSH: TSH: 4.95 u[IU]/mL — AB (ref 0.35–4.50)

## 2017-10-14 LAB — BRAIN NATRIURETIC PEPTIDE: PRO B NATRI PEPTIDE: 561 pg/mL — AB (ref 0.0–100.0)

## 2017-10-14 MED ORDER — DENOSUMAB 60 MG/ML ~~LOC~~ SOSY
60.0000 mg | PREFILLED_SYRINGE | Freq: Once | SUBCUTANEOUS | Status: AC
  Administered 2017-10-14: 60 mg via SUBCUTANEOUS

## 2017-10-14 MED ORDER — MIRTAZAPINE 30 MG PO TABS: 45.0000 mg | ORAL_TABLET | Freq: Every day | ORAL | 0 refills | Status: DC

## 2017-10-14 NOTE — Patient Instructions (Signed)
You need a minimum of 48 ounces of water DAILY TO KEEP YOUR KIDNEYS WORKING  IF YOU DON'T START WALKING FOR 15 MINUTES EVERY DAY,  YOU WILL GET WEAKER AND WEAKER AND END UP IN A WHEEL CHAIR   I want you to increase the Remeron dose to 45 mg at bedtime to see if it helps your energy level  Take the temazepam (Restoril) 30 minutes before bedtime   Check your heart rate at home and let me know if it is staying < 80

## 2017-10-14 NOTE — Progress Notes (Signed)
Subjective:  Patient ID: Cheyenne Torres, female    DOB: 16-Aug-1931  Age: 84 y.o. MRN: [id number]  CC: The primary encounter diagnosis was Fatigue, unspecified type. Diagnoses of Other iron deficiency anemia, Vitamin D deficiency, Other specified diabetes mellitus with diabetic nephropathy, without long-term current use of insulin ([place]), Age-related osteoporosis without current pathological fracture, Proximal leg weakness, Tubular adenoma of colon, Depression, major, single episode, mild ([place]), Calculus of gallbladder with cholecystitis without biliary obstruction, unspecified cholecystitis acuity, Epigastric fullness, and Acquired hypothyroidism were also pertinent to this visit.  HPI Cheyenne Torres presents for 6 month follow up on diabetes, depression and hypertension.  And she is due for her  semi annual Prolia injection for management of osteoporosis   Patient has chronic complaints today of fatigue and weakness that appear to be worsenig.  She is accompanied by her son and husband.  She has been using  restoril chronically for insomnia,and typically lies awake for about 30 minutes waiting for the medication to take effect,  and admits to being overcome with worry thoughts during that half hour .   She is not walking regularly as advised.  She becomes short of breath  Going to [employer].  C/o of bilateral popliteal pain with prolonged standing .  During her cardiology follow up for atrial fibrillation she reported episodes of  recurrent epigastric fullness .  She "was not impressed" with the response she received from cardiology.  She denies pain  In her abdomen and pain in her chest.  She recalls no temporal relationship to eating or stooling    Type 2 DM   Patient is following a low glycemic index diet and taking all prescribed medications regularly without side effects.  Fasting sugars have been under less than 140 most of the time and post prandials have been under 160 except on rare  occasions. Patient is walking for less than ten minutes daily intentionally trying to maintain her current low weight, but reports decreased appetite.  .  Patient has had an eye exam in the last 12 months and checks feet regularly for signs of infection.  Patient does not walk barefoot outside,  And denies an numbness tingling or burning in feet. Patient is up to date on all recommended vaccinations  Outpatient Medications Prior to Visit  Medication Sig Dispense Refill  . atorvastatin (LIPITOR) 20 MG tablet TAKE 1 TABLET BY MOUTH ONCE DAILY 90 tablet 1  . Biotin 5 MG CAPS Take by mouth daily.    . Blood Pressure Monitor KIT Monitor BP once daily 1 each 0  . calcium carbonate (OSCAL) 1500 (600 Ca) MG TABS tablet Take 1,500 mg by mouth daily with breakfast.    . Cholecalciferol (VITAMIN D) 2000 units CAPS Take 2,000 Units by mouth daily.    Marland Kitchen denosumab (PROLIA) 60 MG/ML SOLN injection Inject 60 mg into the skin every 6 (six) months. Administer in upper arm, thigh, or abdomen 1 mL 0  . ELIQUIS 2.5 MG TABS tablet TAKE 1 TABLET BY MOUTH TWICE DAILY 60 tablet 5  . glipiZIDE (GLUCOTROL XL) 2.5 MG 24 hr tablet Take 2.5 mg by mouth daily with breakfast.    . glucose blood (FREESTYLE LITE) test strip Use once daily to  check blood sugars   250.00  90 day supply 100 each 3  . losartan (COZAAR) 100 MG tablet TAKE 1 TABLET BY MOUTH ONCE DAILY 90 tablet 1  . metoprolol tartrate (LOPRESSOR) 50 MG tablet TAKE 2 TABLETS  BY MOUTH TWICE DAILY 180 tablet 1  . temazepam (RESTORIL) 15 MG capsule TAKE 1 CAPSULE BY MOUTH AT BEDTIME AS NEEDED FOR SLEEP 30 capsule 1  . vitamin B-12 (CYANOCOBALAMIN) 1000 MCG tablet Take 1,000 mcg by mouth daily.    . mirtazapine (REMERON) 30 MG tablet TAKE 1 TABLET BY MOUTH AT BEDTIME 90 tablet 1   No facility-administered medications prior to visit.     Review of Systems;  Patient denies headache, fevers, malaise, unintentional weight loss, skin rash, eye pain, sinus congestion and  sinus pain, sore throat, dysphagia,  hemoptysis , cough, dyspnea, wheezing, chest pain, palpitations, orthopnea, edema, abdominal pain, nausea, melena, diarrhea, constipation, flank pain, dysuria, hematuria, urinary  Frequency, nocturia, numbness, tingling, seizures,  Focal weakness, Loss of consciousness,  Tremor, insomnia, depression, anxiety, and suicidal ideation.      Objective:  BP 120/76 (BP Location: Right Arm, Patient Position: Sitting, Cuff Size: Normal)   Pulse 76   Temp 97.7 F (36.5 C) (Oral)   Resp 15   Ht 5' 7"  (1.702 m)   Wt 126 lb 9.6 oz (57.4 kg)   SpO2 98%   BMI 19.83 kg/m   BP Readings from Last 3 Encounters:  10/14/17 120/76  10/01/17 120/84  04/12/17 130/80    Wt Readings from Last 3 Encounters:  10/14/17 126 lb 9.6 oz (57.4 kg)  10/01/17 125 lb 12 oz (57 kg)  04/12/17 134 lb (60.8 kg)    General appearance: alert, cooperative and appears stated age Ears: normal TM's and external ear canals both ears Throat: lips, mucosa, and tongue normal; teeth and gums normal Neck: no adenopathy, no carotid bruit, supple, symmetrical, trachea midline and thyroid not enlarged, symmetric, no tenderness/mass/nodules Back: symmetric, no curvature. ROM normal. No CVA tenderness. Lungs: clear to auscultation bilaterally Heart: regular rate and rhythm, S1, S2 normal, no murmur, click, rub or gallop Abdomen: soft, non-tender; bowel sounds normal; no masses,  no organomegaly Pulses: 2+ and symmetric Skin: Skin color, texture, turgor normal. No rashes or lesions Lymph nodes: Cervical, supraclavicular, and axillary nodes normal.  Lab Results  Component Value Date   HGBA1C 6.6 (H) 10/14/2017   HGBA1C 6.8 (H) 03/18/2017   HGBA1C 6.6 (H) 11/12/2016    Lab Results  Component Value Date   CREATININE 0.94 10/14/2017   CREATININE 1.07 (H) 10/01/2017   CREATININE 1.03 03/18/2017    Lab Results  Component Value Date   WBC 8.5 10/14/2017   HGB 12.2 10/14/2017   HCT 35.5  (L) 10/14/2017   PLT 238.0 10/14/2017   GLUCOSE 106 (H) 10/14/2017   CHOL 126 03/18/2017   TRIG 194.0 (H) 03/18/2017   HDL 49.80 03/18/2017   LDLDIRECT 60.0 05/11/2016   LDLCALC 38 03/18/2017   ALT 11 10/14/2017   AST 14 10/14/2017   NA 139 10/14/2017   K 4.1 10/14/2017   CL 101 10/14/2017   CREATININE 0.94 10/14/2017   BUN 26 (H) 10/14/2017   CO2 28 10/14/2017   TSH 4.95 (H) 10/14/2017   HGBA1C 6.6 (H) 10/14/2017   MICROALBUR 3.9 (H) 10/14/2017    Dg Bone Density  Result Date: 09/04/2017 EXAM: DUAL X-RAY ABSORPTIOMETRY (DXA) FOR BONE MINERAL DENSITY IMPRESSION: Dear Dr. Crecencio Mc, Your patient Rozanne Heumann completed a BMD test on 09/04/2017 using the [place] (analysis version: 14.10) manufactured by [employer]. The following summarizes the results of our evaluation. PATIENT BIOGRAPHICAL: Name: Adeleine, Pask Patient ID:  [id number] Birth Date: Dec 17, 1931 Height:  67.0 in. Gender:      Female Exam Date:  09/04/2017 Weight:     134.0 lbs. Indications: Advanced Age, Postmenopausal, Height Loss, History of Breast Cancer, Caucasian, Hysterectomy Fractures: Treatments: Multi-Vitamin with calcium, Prolia ASSESSMENT: The BMD measured at Femur Total Left is 0.713 g/cm2 with a T-score of -2.3. This patient is considered osteopenic according to [place] [facility]) criteria. L3 was excluded due to degenerative changes. Patient is not a candidate for FRAX due to Prolia. There has been a statistically significant increase in bone mineral density in the total femur evaluation since the most recent prior examination in 2017, indicating a response to bone building therapy. There has been no statistically significant change in the bone mineral density in the lumbar spine. Site Region Date Age WHO Classification T-score BMD AP Spine L1-L4 (L3) 09/04/[age] 83.7 Osteopenia -1.2 1.035 g/cm2 AP Spine L1-L4 (L3) 07/18/2015 81.6 Osteopenia -1.2 1.032 g/cm2 AP Spine L1-L4 (L3)  [birth year]/09/2007 73.3 Osteopenia -1.9 0.954 g/cm2 DualFemur Total Left 09/04/2017 83.7 Osteopenia -2.3 0.713 g/cm2 DualFemur Total Left 07/18/2015 81.6 Osteoporosis -2.7 0.673 g/cm2 DualFemur Total Left 04/09/2007 73.3 Osteoporosis -2.8 0.658 g/cm2 DualFemur Total Mean 09/04/2017 83.7 Osteopenia -2.2 0.736 g/cm2 DualFemur Total Mean 07/18/2015 81.6 Osteopenia -2.4 0.700 g/cm2 DualFemur Total Mean 04/09/2007 73.3 Osteoporosis -2.6 0.681 g/cm2 World Health Organization [facility]) criteria for post-menopausal, Caucasian Women: Normal:       T-score at or above -1 SD Osteopenia:   T-score between -1 and -2.5 SD Osteoporosis: T-score at or below -2.5 SD RECOMMENDATIONS: 1. All patients should optimize calcium and vitamin D intake. 2. Consider FDA-approved medical therapies in postmenopausal women and men aged 48 years and older, based on the following: a. A hip or vertebral(clinical or morphometric) fracture b. T-score < -2.5 at the femoral neck or spine after appropriate evaluation to exclude secondary causes. c. Low bone mass (T-score between -1.0 and -2.5 at the femoral neck or spine) and a 10-year probability of a hip fracture > 3% or a 10-year probability of a major osteoporosis-related fracture > 20% based on the [place]-adapted WHO algorithm d. Clinician judgment and/or patient preferences may indicate treatment for people with 10-year fracture probabilities above or below these levels. FOLLOW-UP: People with diagnosed cases of osteoporosis or at high risk for fracture should have regular bone mineral density tests. For patients eligible for Medicare, routine testing is allowed once every 2 years. The testing frequency can be increased to one year for patients who have rapidly progressing disease, those who are receiving or discontinuing medical therapy to restore bone mass, or have additional risk factors. I have reviewed this report, and agree with the above findings. Interpreted by: Peggye Fothergill, MD, CCD (Certified Clinical  Densitometrist) [facility] Electronically Signed   By: Evangeline Dakin M.D.   On: 09/04/2017 15:45    Assessment & Plan:   Problem List Items Addressed This Visit    Iron deficiency anemia   Relevant Medications   vitamin B-12 (CYANOCOBALAMIN) 1000 MCG tablet   Other Relevant Orders   CBC with Differential/Platelet (Completed)   Iron, TIBC and Ferritin Panel (Completed)   Vitamin D deficiency   Relevant Orders   VITAMIN D 25 Hydroxy (Vit-D Deficiency, Fractures) (Completed)   Osteoporosis   Relevant Medications   denosumab (PROLIA) injection 60 mg (Completed)   Tubular adenoma of colon    Found at age 78 during evaluation for heme positive stool and findings of rectal wall thickening on 2013 CT  Proximal leg weakness    Secondary to deconditioning .  Encouraged to walk daily or face the consequences of weakness which may include falling,  Hip fracture,  Dependence on wheel chairs, and loss of independence.       Hypothyroid    Given her fatigue and depressive symptoms, will treat with 25 mcg dose.  repeat tsh in 6 weeks   Lab Results  Component Value Date   TSH 4.95 (H) 10/14/2017         Relevant Medications   levothyroxine (SYNTHROID, LEVOTHROID) 25 MCG tablet   Other Relevant Orders   TSH   T4, free   Gallstones without obstruction of gallbladder    She has no history of abdominal pain,  Only epigastric fulness,  Her weight is stable.  Last CT was done in 2013 and gallbadder was not mentioned.       Epigastric fullness    Etiology unclear.  Exam is normal,  She has normal BMs and no weight loss.       Diabetes mellitus with diabetic nephropathy (HCC)    Currently well-controlled on current medications .  hemoglobin A1c is at goal of less than 7.0 . Patient is reminded to schedule an annual eye exam and foot exam is up to date and normal .  Patient has no microalbuminuria. Patient is tolerating statin therapy for CAD risk reduction and on  ACE/ARB for renal protection and hypertension   Lab Results  Component Value Date   HGBA1C 6.6 (H) 10/14/2017   Lab Results  Component Value Date   MICROALBUR 3.9 (H) 10/14/2017         Relevant Medications   glipiZIDE (GLUCOTROL XL) 2.5 MG 24 hr tablet   Other Relevant Orders   Hemoglobin A1c (Completed)   Microalbumin / creatinine urine ratio (Completed)   Depression, major, single episode, mild (HCC)    Her lack of motivation to exercise does not occur due to pain, hypoxia or uncontrolled atrial fib (base on walk in office today)  and therefore is presumed to be due to worsening depression,  Increasing  dose of remeron to 45 mg today       Relevant Medications   mirtazapine (REMERON) 30 MG tablet    Other Visit Diagnoses    Fatigue, unspecified type    -  Primary   Relevant Orders   CBC with Differential/Platelet (Completed)   TSH (Completed)   Comprehensive metabolic panel (Completed)   B Nat Peptide (Completed)    A total of 40 minutes of face to face time was spent with patient more than half of which was spent in counselling about her need to walk daily  and coordination of care   I have changed Krislynn C. Loeffler's mirtazapine. I am also having her start on levothyroxine. Additionally, I am having her maintain her glucose blood, Blood Pressure Monitor, denosumab, Vitamin D, Biotin, ELIQUIS, losartan, metoprolol tartrate, temazepam, atorvastatin, calcium carbonate, glipiZIDE, and vitamin B-12. We administered denosumab.  Meds ordered this encounter  Medications  . mirtazapine (REMERON) 30 MG tablet    Sig: Take 1.5 tablets (45 mg total) by mouth at bedtime.    Dispense:  45 tablet    Refill:  0  . denosumab (PROLIA) injection 60 mg  . levothyroxine (SYNTHROID, LEVOTHROID) 25 MCG tablet    Sig: Take 1 tablet (25 mcg total) by mouth daily before breakfast.    Dispense:  90 tablet    Refill:  0    Medications  Discontinued During This Encounter  Medication Reason    . mirtazapine (REMERON) 30 MG tablet     Follow-up: No follow-ups on file.   Crecencio Mc, MD

## 2017-10-15 DIAGNOSIS — E039 Hypothyroidism, unspecified: Secondary | ICD-10-CM | POA: Insufficient documentation

## 2017-10-15 DIAGNOSIS — R1906 Epigastric swelling, mass or lump: Secondary | ICD-10-CM | POA: Insufficient documentation

## 2017-10-15 LAB — IRON,TIBC AND FERRITIN PANEL
%SAT: 21 % (ref 16–45)
Ferritin: 65 ng/mL (ref 16–288)
Iron: 65 ug/dL (ref 45–160)
TIBC: 314 ug/dL (ref 250–450)

## 2017-10-15 MED ORDER — LEVOTHYROXINE SODIUM 25 MCG PO TABS: 25.0000 ug | ORAL_TABLET | Freq: Every day | ORAL | 0 refills | Status: DC

## 2017-10-15 NOTE — Assessment & Plan Note (Signed)
Found at age 84 during evaluation for heme positive stool and findings of rectal wall thickening on 2013 CT

## 2017-10-15 NOTE — Assessment & Plan Note (Signed)
Her lack of motivation to exercise does not occur due to pain, hypoxia or uncontrolled atrial fib (base on walk in office today)  and therefore is presumed to be due to worsening depression,  Increasing  dose of remeron to 45 mg today

## 2017-10-15 NOTE — Assessment & Plan Note (Addendum)
She has no history of abdominal pain,  Only epigastric fulness,  Her weight is stable.  Last CT was done in 2013 and gallbadder was not mentioned.

## 2017-10-15 NOTE — Assessment & Plan Note (Signed)
Currently well-controlled on current medications .  hemoglobin A1c is at goal of less than 7.0 . Patient is reminded to schedule an annual eye exam and foot exam is up to date and normal .  Patient has no microalbuminuria. Patient is tolerating statin therapy for CAD risk reduction and on ACE/ARB for renal protection and hypertension   Lab Results  Component Value Date   HGBA1C 6.6 (H) 10/14/2017   Lab Results  Component Value Date   MICROALBUR 3.9 (H) 10/14/2017

## 2017-10-15 NOTE — Assessment & Plan Note (Signed)
Etiology unclear.  Exam is normal,  She has normal BMs and no weight loss.

## 2017-10-15 NOTE — Assessment & Plan Note (Signed)
Given her fatigue and depressive symptoms, will treat with 25 mcg dose.  repeat tsh in 6 weeks   Lab Results  Component Value Date   TSH 4.95 (H) 10/14/2017

## 2017-10-15 NOTE — Assessment & Plan Note (Signed)
Secondary to deconditioning .  Encouraged to walk daily or face the consequences of weakness which may include falling,  Hip fracture,  Dependence on wheel chairs, and loss of independence.

## 2017-10-20 ENCOUNTER — Other Ambulatory Visit: Payer: Self-pay | Admitting: Internal Medicine

## 2017-10-21 NOTE — Telephone Encounter (Signed)
Refilled: 08/21/2017 Last OV: 10/14/2017 Next OV: not scheduled

## 2017-10-21 NOTE — Telephone Encounter (Signed)
Printed, signed and faxed.  

## 2017-10-24 ENCOUNTER — Telehealth: Payer: Self-pay

## 2017-10-24 NOTE — Telephone Encounter (Signed)
Copied from [place] [id number]. Topic: General - Other >> Oct 24, 2017  3:30 PM Judyann Munson wrote: Reason for CRM:  Patient  calling in regards to her blood pressure reading . She requesting to speak to [place]

## 2017-10-25 ENCOUNTER — Ambulatory Visit: Payer: Self-pay | Admitting: *Deleted

## 2017-10-25 NOTE — Telephone Encounter (Signed)
Dr. Lupita Dawn nurse has spoken with patient earlier and informed her if symptoms get worse ( blood pressure, dizziness, chest pain, and  headaches ) that she needs to go to the ED. Patient did not remember that but does states she gotten her blood pressure to go down. I informed her again to make sure she takes her medication as directed and if she starts to have headaches, chest pain, dizzness, or blood pressure goes up then she needs to go the ED. Patient verbalized understanding

## 2017-10-25 NOTE — Telephone Encounter (Signed)
See triage message.

## 2017-10-25 NOTE — Telephone Encounter (Signed)
Spoke with pt to see if she could be here by 1:15pm. Pt stated that she missed the evening dose of her metoprolol yesterday but now that she has taken her first dose this morning her blood pressure has come down to 129/88 and her headache has gotten better. The pt stated that she is not having any chest pain or shortness of breath and wasn't when her blood pressure was high either. Advised the pt that if her blood pressure spiked up again or if she started having shortness of breath or chest pain than she would need to go to the ER. Pt gave a verbal understanding.

## 2017-10-25 NOTE — Telephone Encounter (Signed)
Pt called with c/o hypertension and having a dull headache. Denies any other cardiac symptoms. Requesting to have an appointment today and to let he pcp know about her b/p readings. Her b/p readings are 147/97 and 146/104. She has taken extra strength tylenol just now.  Will route to flow at  [facility].  Per protocol, she needs to be scheduled within 3 days. Also offered patient to go to urgent care to be seen. Pt advised to call back for increase in cardiac symptoms, pt voiced understanding.  Reason for Disposition . Systolic BP  >= 400 OR Diastolic >= 867  Answer Assessment - Initial Assessment Questions 1. LOCATION: "Where does it hurt?"      Over her eyes 2. ONSET: "When did the headache start?" (Minutes, hours or days)      This morning 3. PATTERN: "Does the pain come and go, or has it been constant since it started?"     constant 4. SEVERITY: "How bad is the pain?" and "What does it keep you from doing?"  (e.g., Scale 1-10; mild, moderate, or severe)   - MILD (1-3): doesn't interfere with normal activities    - MODERATE (4-7): interferes with normal activities or awakens from sleep    - SEVERE (8-10): excruciating pain, unable to do any normal activities        Pain #8 5. RECURRENT SYMPTOM: "Have you ever had headaches before?" If so, ask: "When was the last time?" and "What happened that time?"      Yes, take 1 extra strength Tylenol 6. CAUSE: "What do you think is causing the headache?"     Not sure  7. MIGRAINE: "Have you been diagnosed with migraine headaches?" If so, ask: "Is this headache similar?"      no 8. HEAD INJURY: "Has there been any recent injury to the head?"      no 9. OTHER SYMPTOMS: "Do you have any other symptoms?" (fever, stiff neck, eye pain, sore throat, cold symptoms)     no  Answer Assessment - Initial Assessment Questions 1. BLOOD PRESSURE: "What is the blood pressure?" "Did you take at least two measurements 5 minutes apart?"  147/104 and 146/104 2. ONSET: "When did you take your blood pressure?"     This morning 3. HOW: "How did you obtain the blood pressure?" (e.g., visiting nurse, automatic home BP monitor)     Automatic home BP monitor 4. HISTORY: "Do you have a history of high blood pressure?"     yes 5. MEDICATIONS: "Are you taking any medications for blood pressure?" "Have you missed any doses recently?"     no 6. OTHER SYMPTOMS: "Do you have any symptoms?" (e.g., headache, chest pain, blurred vision, difficulty breathing, weakness)     headache  Protocols used: HIGH BLOOD PRESSURE-A-AH, HEADACHE-A-AH

## 2017-10-25 NOTE — Telephone Encounter (Signed)
Patient still waiting to hear from Dr Derrel Nip, unable to attach message to triage note

## 2017-10-25 NOTE — Telephone Encounter (Signed)
Patient was triaged by [facility] nurse. Please see note below

## 2017-11-05 ENCOUNTER — Telehealth: Payer: Self-pay | Admitting: Internal Medicine

## 2017-11-05 NOTE — Telephone Encounter (Unsigned)
Copied from [place] [id number]. Topic: Quick Communication - See Telephone Encounter >> Nov 05, 2017 10:50 AM Percell Belt A wrote: CRM for notification. See Telephone encounter for: 11/05/17.  Pt called called in and requesting to talk to Kerin Salen about her bp.  She stated it was 142/86 this morning and only wanted to speak with Juliann Pulse.  Best number [phone]

## 2017-11-05 NOTE — Telephone Encounter (Signed)
Patient notified and voiced understanding stating she will have her son send readings through my chart.

## 2017-11-05 NOTE — Telephone Encounter (Signed)
I need more than one day of elevated readings to change bp medication otherwsie I do  More harm than good. Please collect readings over one week before sending

## 2017-11-05 NOTE — Telephone Encounter (Signed)
Please advise 

## 2017-11-05 NOTE — Telephone Encounter (Signed)
Patient stated PCP wanted to be advised of Increased BP with diastolic above 80 , patient stated she has seen BP trending up in the morning . After resting a while this morning BP 124/84 could not give pulse reading . Had patient recheck BP while on phone 140/95 pulse 86 in left arm.

## 2017-11-17 ENCOUNTER — Encounter: Payer: Self-pay | Admitting: Internal Medicine

## 2017-11-22 ENCOUNTER — Other Ambulatory Visit: Payer: Self-pay | Admitting: Internal Medicine

## 2017-11-22 ENCOUNTER — Other Ambulatory Visit: Payer: Self-pay | Admitting: Cardiovascular Disease

## 2017-11-22 NOTE — Telephone Encounter (Signed)
Please review for refill, Thanks !  

## 2017-12-04 ENCOUNTER — Ambulatory Visit (INDEPENDENT_AMBULATORY_CARE_PROVIDER_SITE_OTHER): Payer: Medicare HMO | Admitting: Internal Medicine

## 2017-12-04 ENCOUNTER — Encounter: Payer: Self-pay | Admitting: Internal Medicine

## 2017-12-04 VITALS — BP 120/78 | HR 65 | Temp 97.5°F | Resp 15 | Ht 67.0 in | Wt 125.4 lb

## 2017-12-04 DIAGNOSIS — E039 Hypothyroidism, unspecified: Secondary | ICD-10-CM | POA: Diagnosis not present

## 2017-12-04 DIAGNOSIS — I10 Essential (primary) hypertension: Secondary | ICD-10-CM

## 2017-12-04 NOTE — Progress Notes (Signed)
Subjective:  Patient ID: Cheyenne Torres, female    DOB: [birth year]  Age: 84 y.o. MRN: [id number]  CC: The primary encounter diagnosis was Acquired hypothyroidism. A diagnosis of Essential hypertension was also pertinent to this visit.  HPI    Cheyenne Torres presents for follow up on hypertension and  Recent initiation of treatment for hypothyroidism.  25 mcg dose  Patient not happy .  Thinks the thyroid medication is to blame for her new onset nocturia. She Has been having nocturia 4-5 tims per night since last visit.  drinkning more water up until 8 om every night .  Has increased her water intake,  Drinking 2 glasses in the evening after dinner. Denies frequency during the day,  No dysuria.   Denies tremor, nervousness,  Insomnia, increased heart rate,  Increased bowel movements.  Hypertension: patient checks blood pressure twice weekly at home.  Readings have been for the most part <130/80 at rest . Patient is following a reduce salt diet most days and is taking medications as prescribed   Lab Results  Component Value Date   TSH 2.94 12/04/2017   Lab Results  Component Value Date   HGBA1C 6.6 (H) 10/14/2017     Outpatient Medications Prior to Visit  Medication Sig Dispense Refill  . atorvastatin (LIPITOR) 20 MG tablet TAKE 1 TABLET BY MOUTH ONCE DAILY 90 tablet 1  . Biotin 5 MG CAPS Take by mouth daily.    . Blood Pressure Monitor KIT Monitor BP once daily 1 each 0  . calcium carbonate (OSCAL) 1500 (600 Ca) MG TABS tablet Take 1,500 mg by mouth daily with breakfast.    . Cholecalciferol (VITAMIN D) 2000 units CAPS Take 2,000 Units by mouth daily.    Marland Kitchen denosumab (PROLIA) 60 MG/ML SOLN injection Inject 60 mg into the skin every 6 (six) months. Administer in upper arm, thigh, or abdomen 1 mL 0  . ELIQUIS 2.5 MG TABS tablet TAKE 1 TABLET BY MOUTH TWICE DAILY 60 tablet 5  . glipiZIDE (GLUCOTROL XL) 2.5 MG 24 hr tablet Take 2.5 mg by mouth daily with breakfast.    . glucose  blood (FREESTYLE LITE) test strip Use once daily to  check blood sugars   250.00  90 day supply 100 each 3  . levothyroxine (SYNTHROID, LEVOTHROID) 25 MCG tablet Take 1 tablet (25 mcg total) by mouth daily before breakfast. 90 tablet 0  . losartan (COZAAR) 100 MG tablet TAKE 1 TABLET BY MOUTH ONCE DAILY 90 tablet 1  . metoprolol tartrate (LOPRESSOR) 50 MG tablet TAKE 2 TABLETS BY MOUTH TWICE DAILY 180 tablet 1  . mirtazapine (REMERON) 30 MG tablet Take 1.5 tablets (45 mg total) by mouth at bedtime. 45 tablet 0  . temazepam (RESTORIL) 15 MG capsule TAKE 1 CAPSULE BY MOUTH EVERY DAY AT BEDTIME AS NEEDED FOR SLEEP 30 capsule 1  . vitamin B-12 (CYANOCOBALAMIN) 1000 MCG tablet Take 1,000 mcg by mouth daily.     No facility-administered medications prior to visit.     Review of Systems;  Patient denies headache, fevers, malaise, unintentional weight loss, skin rash, eye pain, sinus congestion and sinus pain, sore throat, dysphagia,  hemoptysis , cough, dyspnea, wheezing, chest pain, palpitations, orthopnea, edema, abdominal pain, nausea, melena, diarrhea, constipation, flank pain, dysuria, hematuria, urinary  Frequency, nocturia, numbness, tingling, seizures,  Focal weakness, Loss of consciousness,  Tremor, insomnia, depression, anxiety, and suicidal ideation.      Objective:  BP 120/78 (BP Location: Left Arm,  Patient Position: Sitting, Cuff Size: Normal)   Pulse 65   Temp (!) 97.5 F (36.4 C) (Oral)   Resp 15   Ht _0  (1.702 m)   Wt 125 lb 6.4 oz (56.9 kg)   SpO2 98%   BMI 19.64 kg/m   BP Readings from Last 3 Encounters:  12/04/17 120/78  10/14/17 120/76  10/01/17 120/84    Wt Readings from Last 3 Encounters:  12/04/17 125 lb 6.4 oz (56.9 kg)  10/14/17 126 lb 9.6 oz (57.4 kg)  10/01/17 125 lb 12 oz (57 kg)    General appearance: alert, cooperative and appears stated age Ears: normal TM's and external ear canals both ears Throat: lips, mucosa, and tongue normal; teeth and gums  normal Neck: no adenopathy, no carotid bruit, supple, symmetrical, trachea midline and thyroid not enlarged, symmetric, no tenderness/mass/nodules Back: symmetric, no curvature. ROM normal. No CVA tenderness. Lungs: clear to auscultation bilaterally Heart: regular rate and rhythm, S1, S2 normal, no murmur, click, rub or gallop Abdomen: soft, non-tender; bowel sounds normal; no masses,  no organomegaly Pulses: 2+ and symmetric Skin: Skin color, texture, turgor normal. No rashes or lesions Lymph nodes: Cervical, supraclavicular, and axillary nodes normal.  Lab Results  Component Value Date   HGBA1C 6.6 (H) 10/14/2017   HGBA1C 6.8 (H) 03/18/2017   HGBA1C 6.6 (H) 11/12/2016    Lab Results  Component Value Date   CREATININE 0.94 10/14/2017   CREATININE 1.07 (H) 10/01/2017   CREATININE 1.03 03/18/2017    Lab Results  Component Value Date   WBC 8.5 10/14/2017   HGB 12.2 10/14/2017   HCT 35.5 (L) 10/14/2017   PLT 238.0 10/14/2017   GLUCOSE 106 (H) 10/14/2017   CHOL 126 03/18/2017   TRIG 194.0 (H) 03/18/2017   HDL 49.80 03/18/2017   LDLDIRECT 60.0 05/11/2016   LDLCALC 38 03/18/2017   ALT 11 10/14/2017   AST 14 10/14/2017   NA 139 10/14/2017   K 4.1 10/14/2017   CL 101 10/14/2017   CREATININE 0.94 10/14/2017   BUN 26 (H) 10/14/2017   CO2 28 10/14/2017   TSH 2.94 12/04/2017   HGBA1C 6.6 (H) 10/14/2017   MICROALBUR 3.9 (H) 10/14/2017    Dg Bone Density  Result Date: 09/04/2017 EXAM: DUAL X-RAY ABSORPTIOMETRY (DXA) FOR BONE MINERAL DENSITY IMPRESSION: Dear Dr. Crecencio Mc, Your patient Cheyenne Torres completed a BMD test on 09/04/2017 using the [place] (analysis version: 14.10) manufactured by [employer]. The following summarizes the results of our evaluation. PATIENT BIOGRAPHICAL: Name: Cheyenne, Torres Patient ID:  [id number] Birth Date: 08-Mar-1932 Height:     67.0 in. Gender:      Female Exam Date:  09/04/2017 Weight:     134.0 lbs. Indications: Advanced  Age, Postmenopausal, Height Loss, History of Breast Cancer, Caucasian, Hysterectomy Fractures: Treatments: Multi-Vitamin with calcium, Prolia ASSESSMENT: The BMD measured at Femur Total Left is 0.713 g/cm2 with a T-score of -2.3. This patient is considered osteopenic according to [facility]) criteria. L3 was excluded due to degenerative changes. Patient is not a candidate for FRAX due to Prolia. There has been a statistically significant increase in bone mineral density in the total femur evaluation since the most recent prior examination in 2017, indicating a response to bone building therapy. There has been no statistically significant change in the bone mineral density in the lumbar spine. Site Region Date Age WHO Classification T-score BMD AP Spine L1-L4 (L3) 09/04/[age] 83.7 Osteopenia -1.2 1.035 g/cm2  AP Spine L1-L4 (L3) 07/18/2015 81.6 Osteopenia -1.2 1.032 g/cm2 AP Spine L1-L4 (L3) 04/09/2007 73.3 Osteopenia -1.9 0.954 g/cm2 DualFemur Total Left 09/04/2017 83.7 Osteopenia -2.3 0.713 g/cm2 DualFemur Total Left 07/18/2015 81.6 Osteoporosis -2.7 0.673 g/cm2 DualFemur Total Left 04/09/2007 73.3 Osteoporosis -2.8 0.658 g/cm2 DualFemur Total Mean 09/04/2017 83.7 Osteopenia -2.2 0.736 g/cm2 DualFemur Total Mean 07/18/2015 81.6 Osteopenia -2.4 0.700 g/cm2 DualFemur Total Mean 04/09/2007 73.3 Osteoporosis -2.6 0.681 g/cm2 World Health Organization [facility]) criteria for post-menopausal, Caucasian Women: Normal:       T-score at or above -1 SD Osteopenia:   T-score between -1 and -2.5 SD Osteoporosis: T-score at or below -2.5 SD RECOMMENDATIONS: 1. All patients should optimize calcium and vitamin D intake. 2. Consider FDA-approved medical therapies in postmenopausal women and men aged 38 years and older, based on the following: a. A hip or vertebral(clinical or morphometric) fracture b. T-score < -2.5 at the femoral neck or spine after appropriate evaluation to exclude secondary causes. c. Low bone mass  (T-score between -1.0 and -2.5 at the femoral neck or spine) and a 10-year probability of a hip fracture > 3% or a 10-year probability of a major osteoporosis-related fracture > 20% based on the [place]-adapted WHO algorithm d. Clinician judgment and/or patient preferences may indicate treatment for people with 10-year fracture probabilities above or below these levels. FOLLOW-UP: People with diagnosed cases of osteoporosis or at high risk for fracture should have regular bone mineral density tests. For patients eligible for Medicare, routine testing is allowed once every 2 years. The testing frequency can be increased to one year for patients who have rapidly progressing disease, those who are receiving or discontinuing medical therapy to restore bone mass, or have additional risk factors. I have reviewed this report, and agree with the above findings. Interpreted by: Peggye Fothergill, MD, CCD (Certified Clinical Densitometrist) [facility] Electronically Signed   By: Evangeline Dakin M.D.   On: 09/04/2017 15:45    Assessment & Plan:   Problem List Items Addressed This Visit    Hypertension    Well controlled on current regimen based on home readings . Renal function stable, no changes today.  Lab Results  Component Value Date   CREATININE 0.94 10/14/2017   Lab Results  Component Value Date   NA 139 10/14/2017   K 4.1 10/14/2017   CL 101 10/14/2017   CO2 28 10/14/2017         Hypothyroid - Primary    .Thyroid function is WNL on current dose of 25 mcg. .  Advised patient to avoid drinking water in the evening,  As this was the likely cause of her neww onset nocturia .  No current changes needed.   Lab Results  Component Value Date   TSH 2.94 12/04/2017         Relevant Orders   TSH (Completed)    A total of 25 minutes of face to face time was spent with patient more than half of which was spent in counselling about the above mentioned conditions  and coordination of care     I am having Somaly C. Lack maintain her glucose blood, Blood Pressure Monitor, denosumab, Vitamin D, Biotin, losartan, atorvastatin, calcium carbonate, glipiZIDE, vitamin B-12, mirtazapine, levothyroxine, temazepam, metoprolol tartrate, and ELIQUIS.  No orders of the defined types were placed in this encounter.   There are no discontinued medications.  Follow-up: No follow-ups on file.   Crecencio Mc, MD

## 2017-12-04 NOTE — Patient Instructions (Addendum)
No changes to your medication today.  Your blood pressure is fine!     stop checking your blood pressure  You can stop the daily blood sugars checks as well    Stop drinking  water after 5 pm.  Increase your  water intake in the morning and early afternoon

## 2017-12-05 LAB — TSH: TSH: 2.94 u[IU]/mL (ref 0.35–4.50)

## 2017-12-07 NOTE — Assessment & Plan Note (Addendum)
Well controlled on current regimen based on home readings . Renal function stable, no changes today.  Lab Results  Component Value Date   CREATININE 0.94 10/14/2017   Lab Results  Component Value Date   NA 139 10/14/2017   K 4.1 10/14/2017   CL 101 10/14/2017   CO2 28 10/14/2017

## 2017-12-07 NOTE — Assessment & Plan Note (Addendum)
.  Thyroid function is WNL on current dose of 25 mcg. .  Advised patient to avoid drinking water in the evening,  As this was the likely cause of her neww onset nocturia .  No current changes needed.   Lab Results  Component Value Date   TSH 2.94 12/04/2017

## 2017-12-15 ENCOUNTER — Other Ambulatory Visit: Payer: Self-pay | Admitting: Internal Medicine

## 2017-12-17 NOTE — Telephone Encounter (Signed)
Last refill 11/22/17 Last office visit 12/04/17

## 2018-01-09 ENCOUNTER — Telehealth: Payer: Self-pay

## 2018-01-09 NOTE — Telephone Encounter (Signed)
Copied from [place] [id number]. Topic: General - Other >> Jan 09, 2018 11:10 AM Cecelia Byars, NT wrote: Reason for CRM: Patients son called and would like to schedule the patient for a prolia injection the same day as a a 6 month follow up , he would also like to have his dad come in the same day please call to discuss and schedule ,at  [phone]

## 2018-01-10 NOTE — Telephone Encounter (Signed)
No, patient just had Prolia n august not due again til February will have to wait until at least Jan 1.

## 2018-01-10 NOTE — Telephone Encounter (Signed)
Has pt's prolia injection been approved by insurance so appt can be scheduled for next injection?

## 2018-01-16 DIAGNOSIS — H524 Presbyopia: Secondary | ICD-10-CM | POA: Diagnosis not present

## 2018-01-20 ENCOUNTER — Other Ambulatory Visit: Payer: Self-pay | Admitting: Internal Medicine

## 2018-01-20 DIAGNOSIS — E039 Hypothyroidism, unspecified: Secondary | ICD-10-CM

## 2018-01-21 ENCOUNTER — Ambulatory Visit: Payer: Medicare HMO | Admitting: Internal Medicine

## 2018-01-22 DIAGNOSIS — R69 Illness, unspecified: Secondary | ICD-10-CM | POA: Diagnosis not present

## 2018-02-04 ENCOUNTER — Telehealth: Payer: Self-pay | Admitting: Internal Medicine

## 2018-02-04 DIAGNOSIS — M25552 Pain in left hip: Secondary | ICD-10-CM

## 2018-02-04 NOTE — Telephone Encounter (Signed)
Copied from [place]. Topic: General - Other >> Feb 04, 2018 10:43 AM Marin Olp L wrote: Reason for CRM: Patient would like a call back from [facility] or cma to discuss levothyroxine (SYNTHROID, LEVOTHROID) 25 MCG tablet  in relation to some left leg pain that comes and goes. Next available with [facility] 11/13 and lives an hour away.

## 2018-02-04 NOTE — Telephone Encounter (Signed)
rx advise 

## 2018-02-05 NOTE — Telephone Encounter (Signed)
Spoke with pt and she started taking levothyroxine about 3 or 4 months ago and about to two weeks ago the pt started having left leg pain that starts in her hip and radiates to the left knee. Pt stated that she has not had a recent fall. No swelling, no redness, no warmth. Pt stated that the pain comes and goes and when it hits her almost causes her to fall. Pt described the pain as a sharp shooting pain. Pt also stated that she is having bilateral foot swelling. Went ahead and scheduled pt for Friday at 11:30 pt will arrive at 11 to have xray done.

## 2018-02-05 NOTE — Telephone Encounter (Signed)
Xrays ordered.

## 2018-02-07 ENCOUNTER — Ambulatory Visit (INDEPENDENT_AMBULATORY_CARE_PROVIDER_SITE_OTHER): Payer: Medicare HMO | Admitting: Internal Medicine

## 2018-02-07 ENCOUNTER — Encounter: Payer: Self-pay | Admitting: Internal Medicine

## 2018-02-07 ENCOUNTER — Ambulatory Visit
Admission: RE | Admit: 2018-02-07 | Discharge: 2018-02-07 | Disposition: A | Discharge status: Home / Self Care | Payer: Medicare HMO | Source: Ambulatory Visit | Attending: Internal Medicine | Admitting: Internal Medicine

## 2018-02-07 DIAGNOSIS — S76112A Strain of left quadriceps muscle, fascia and tendon, initial encounter: Secondary | ICD-10-CM | POA: Insufficient documentation

## 2018-02-07 DIAGNOSIS — M1612 Unilateral primary osteoarthritis, left hip: Secondary | ICD-10-CM | POA: Diagnosis not present

## 2018-02-07 DIAGNOSIS — M25552 Pain in left hip: Secondary | ICD-10-CM

## 2018-02-07 MED ORDER — MELOXICAM 7.5 MG PO TABS: 7.5000 mg | ORAL_TABLET | Freq: Every day | ORAL | 0 refills | Status: DC

## 2018-02-07 NOTE — Progress Notes (Signed)
Subjective:  Patient ID: Cheyenne Torres, female    DOB: 06-24-1931  Age: 84 y.o. MRN: [id number]  CC: The encounter diagnosis was Quadriceps muscle strain, left, initial encounter.  HPI Cheyenne Torres presents for  evaluation of left anterior thigh pain  that has been present for 2-3 weeks.  The pain started after a near fall , when she reached down to steady herself on a table that ended up rolling away from her, causing her to strain her thigh.  The pain is localized  anteriorly and medially.  She has not noticed any bruising.  It hurts to get up and down   She has been Using ES tylenol    Outpatient Medications Prior to Visit  Medication Sig Dispense Refill  . atorvastatin (LIPITOR) 20 MG tablet TAKE 1 TABLET BY MOUTH ONCE DAILY 90 tablet 1  . Biotin 5 MG CAPS Take by mouth daily.    . Blood Pressure Monitor KIT Monitor BP once daily 1 each 0  . calcium carbonate (OSCAL) 1500 (600 Ca) MG TABS tablet Take 1,500 mg by mouth daily with breakfast.    . Cholecalciferol (VITAMIN D) 2000 units CAPS Take 2,000 Units by mouth daily.    Marland Kitchen denosumab (PROLIA) 60 MG/ML SOLN injection Inject 60 mg into the skin every 6 (six) months. Administer in upper arm, thigh, or abdomen 1 mL 0  . ELIQUIS 2.5 MG TABS tablet TAKE 1 TABLET BY MOUTH TWICE DAILY 60 tablet 5  . glipiZIDE (GLUCOTROL XL) 2.5 MG 24 hr tablet Take 2.5 mg by mouth daily with breakfast.    . glucose blood (FREESTYLE LITE) test strip Use once daily to  check blood sugars   250.00  90 day supply 100 each 3  . levothyroxine (SYNTHROID, LEVOTHROID) 25 MCG tablet TAKE 1 TABLET BY MOUTH ONCE DAILY BEFORE BREAKFAST 90 tablet 1  . losartan (COZAAR) 100 MG tablet TAKE 1 TABLET BY MOUTH ONCE DAILY 90 tablet 1  . metoprolol tartrate (LOPRESSOR) 50 MG tablet TAKE 2 TABLETS BY MOUTH TWICE DAILY 180 tablet 1  . mirtazapine (REMERON) 30 MG tablet Take 1.5 tablets (45 mg total) by mouth at bedtime. 45 tablet 0  . mirtazapine (REMERON) 30 MG tablet TAKE  1 TABLET BY MOUTH AT BEDTIME 90 tablet 1  . temazepam (RESTORIL) 15 MG capsule TAKE 1 CAPSULE BY MOUTH ONCE DAILY AT BEDTIME AS NEEDED FOR SLEEP 30 capsule 5  . vitamin B-12 (CYANOCOBALAMIN) 1000 MCG tablet Take 1,000 mcg by mouth daily.     No facility-administered medications prior to visit.     Review of Systems;  Patient denies headache, fevers, malaise, unintentional weight loss, skin rash, eye pain, sinus congestion and sinus pain, sore throat, dysphagia,  hemoptysis , cough, dyspnea, wheezing, chest pain, palpitations, orthopnea, edema, abdominal pain, nausea, melena, diarrhea, constipation, flank pain, dysuria, hematuria, urinary  Frequency, nocturia, numbness, tingling, seizures,  Focal weakness, Loss of consciousness,  Tremor, insomnia, depression, anxiety, and suicidal ideation.      Objective:  BP 128/70 (BP Location: Left Arm, Patient Position: Sitting, Cuff Size: Normal)   Pulse 68   Temp (!) 97.5 F (36.4 C) (Oral)   Resp 17   Ht 5' 7"  (1.702 m)   Wt 127 lb 9.6 oz (57.9 kg)   SpO2 97%   BMI 19.98 kg/m   BP Readings from Last 3 Encounters:  02/07/18 128/70  12/04/17 120/78  10/14/17 120/76    Wt Readings from Last 3 Encounters:  02/07/18  127 lb 9.6 oz (57.9 kg)  12/04/17 125 lb 6.4 oz (56.9 kg)  10/14/17 126 lb 9.6 oz (57.4 kg)    General appearance: alert, cooperative and appears stated age Neck: no adenopathy, no carotid bruit, supple, symmetrical, trachea midline and thyroid not enlarged, symmetric, no tenderness/mass/nodules Back: symmetric, no curvature. ROM normal. No CVA tenderness. Lungs: clear to auscultation bilaterally Heart: regular rate and rhythm, S1, S2 normal, no murmur, click, rub or gallop Abdomen: soft, non-tender; bowel sounds normal; no masses,  no organomegaly Pulses: 2+ and symmetric Skin: Skin color, texture, turgor normal. No bruising, rashes or lesions MSK: bilateral proximal muscle weakness,  Pain elicited in left thigh with forced  extension and flexion of hip .  Hip ROM normal.  Lymph nodes: Cervical, supraclavicular, and axillary nodes normal.  Lab Results  Component Value Date   HGBA1C 6.6 (H) 10/14/2017   HGBA1C 6.8 (H) 03/18/2017   HGBA1C 6.6 (H) 11/12/2016    Lab Results  Component Value Date   CREATININE 0.94 10/14/2017   CREATININE 1.07 (H) 10/01/2017   CREATININE 1.03 03/18/2017    Lab Results  Component Value Date   WBC 8.5 10/14/2017   HGB 12.2 10/14/2017   HCT 35.5 (L) 10/14/2017   PLT 238.0 10/14/2017   GLUCOSE 106 (H) 10/14/2017   CHOL 126 03/18/2017   TRIG 194.0 (H) 03/18/2017   HDL 49.80 03/18/2017   LDLDIRECT 60.0 05/11/2016   LDLCALC 38 03/18/2017   ALT 11 10/14/2017   AST 14 10/14/2017   NA 139 10/14/2017   K 4.1 10/14/2017   CL 101 10/14/2017   CREATININE 0.94 10/14/2017   BUN 26 (H) 10/14/2017   CO2 28 10/14/2017   TSH 2.94 12/04/2017   HGBA1C 6.6 (H) 10/14/2017   MICROALBUR 3.9 (H) 10/14/2017    No results found.  Assessment & Plan:   Problem List Items Addressed This Visit    Quadriceps muscle strain, left, initial encounter    Suggested by exam and history,  Plain films of hip are normal for age. Ice, NSAIds, and PT       Relevant Orders   Ambulatory referral to Physical Therapy     A total of 25 minutes of face to face time was spent with patient more than half of which was spent in counselling about the above mentioned conditions  and coordination of care   I am having Gilbert C. Loser start on meloxicam. I am also having her maintain her glucose blood, Blood Pressure Monitor, denosumab, Vitamin D, Biotin, losartan, atorvastatin, calcium carbonate, glipiZIDE, vitamin B-12, mirtazapine, metoprolol tartrate, ELIQUIS, temazepam, levothyroxine, and mirtazapine.  Meds ordered this encounter  Medications  . meloxicam (MOBIC) 7.5 MG tablet    Sig: Take 1 tablet (7.5 mg total) by mouth daily.    Dispense:  30 tablet    Refill:  0    There are no discontinued  medications.  Follow-up: No follow-ups on file.   Crecencio Mc, MD

## 2018-02-07 NOTE — Patient Instructions (Addendum)
You have strained your quadriceps muscle from your "near fall".    I want you take meloxicam 7.5 mg daily,  And tylenol 1000 mg twice daily  For the next 2 weeks  Ice the thigh ffor 15 minutes every 4 hours while awake.  Physical Therapy would be helpful. I HAVE ORDERED it through  [facility] in [place] .  They are in the [facility] shopping center    Quadriceps Strain A quadriceps strain is an injury to the muscles or tendons on the front of the thigh. The quadriceps muscles are used in straightening the knee and bending the hip. A strain occurs when the muscle is overstretched or overloaded. There are three types of strains:  Grade 1 is a mild strain. It involves a stretching or minor tearing of your muscle fibers or tendons. You should have little, if any, trouble using your thigh.  Grade 2 is a moderate strain. It involves a partial tearing of your muscle fibers or tendons. You will have pain and some loss of strength in your thigh.  Grade 3 is a severe strain. It involves a complete tearing of your muscle fibers or tendon. It causes severe pain and loss of strength in your thigh.  Recovery will take a few weeks or longer, depending on how bad your strain is. What are the causes? This injury is caused by overextending the muscles in the thigh. What increases the risk? The following factors may make you more likely to develop this injury:  Participating in: ? Activities that involve jumping or sprinting. ? Contact sports, such as football or soccer.  Having a previous injury to your thigh or knee.  Having poor strength and flexibility.  Not warming up properly before activity.  Having one leg that is much stronger than the other.  Exercising to the point of exhaustion.  What are the signs or symptoms? Symptoms of this condition include:  Sudden, severe pain in your thigh.  Pain and tenderness over your quadriceps muscles. The pain gets worse when you use these  muscles.  Muscle spasm in your thigh.  Bruising.  Having trouble with tasks that involve using your quadriceps muscle, such as walking.  A crackling sound when the tendon is moved or touched.  How is this diagnosed? This condition may be diagnosed based on your symptoms, your medical history, and a physical exam. Your health care provider may order tests such as an X-ray, ultrasound, or MRI to further evaluate your injury and to rule out other conditions. How is this treated? Treatment for this condition may include:  Resting your leg and avoiding activities that cause pain.  Taking medicine to help reduce pain and inflammation.  Applying ice to the area to relieve swelling and inflammation.  Using crutches until you can walk without pain.  Working with a physical therapist on exercises to restore strength and flexibility in your thigh.  In rare cases, surgery may be needed. Follow these instructions at home: Managing pain, stiffness, and swelling  If directed, put ice on the injured area: ? Put ice in a plastic bag. ? Place a towel between your skin and the bag. ? Leave the ice on for 20 minutes, 2-3 times a day.  Raise (elevate) the injured area above the level of your heart while you are sitting or lying down. Activity  Do not use the injured limb to support your body weight until your health care provider says that you can. Use crutches as  told by your health care provider.  Do exercises as told by your health care provider.  Return to your normal activities as told by your health care provider. Ask your health care provider what activities are safe for you. General instructions  Take over-the-counter and prescription medicines only as told by your health care provider.  Keep all follow-up visits as told by your health care provider. This is important. How is this prevented?  Warm up and stretch before being active.  Cool down and stretch after being  active.  Give your body time to rest between periods of activity.  Make sure to use equipment that fits you.  Be safe and responsible while being active to avoid falls.  Do at least 150 minutes of moderate-intensity exercise each week, such as brisk walking or water aerobics.  Maintain physical fitness, including: ? Strength ? Flexibility. ? Cardiovascular fitness. ? Endurance. Contact a health care provider if:  Your pain, bruising, or tenderness gets worse, even with treatment.  Your leg becomes weaker. This information is not intended to replace advice given to you by your health care provider. Make sure you discuss any questions you have with your health care provider. Document Released: 04/16/2005 Document Revised: 12/20/2015 Document Reviewed: 01/18/2015 Elsevier Interactive Patient Education  [employer].

## 2018-02-09 NOTE — Assessment & Plan Note (Addendum)
Suggested by exam and history,  Plain films of hip are normal for age. Ice, NSAIds, and PT

## 2018-02-11 ENCOUNTER — Telehealth: Payer: Self-pay

## 2018-02-11 NOTE — Telephone Encounter (Signed)
Copied from [place] [id number]. Topic: Referral - Status >> Feb 11, 2018  4:40 PM Rutherford Nail, [place] wrote: Reason for CRM: Vidi from [facility] calling and states that she spoke with the patient today and the patient has deferred at this time. Stating that the patient told her that she is feeling better at this time.

## 2018-02-20 ENCOUNTER — Other Ambulatory Visit: Payer: Self-pay | Admitting: Internal Medicine

## 2018-03-13 ENCOUNTER — Other Ambulatory Visit: Payer: Self-pay | Admitting: Internal Medicine

## 2018-03-18 ENCOUNTER — Telehealth: Payer: Self-pay | Admitting: Internal Medicine

## 2018-03-18 NOTE — Telephone Encounter (Signed)
Prolia verification filed and prior authorization filed with insurance.

## 2018-04-03 ENCOUNTER — Ambulatory Visit: Payer: Medicare HMO | Admitting: Cardiovascular Disease

## 2018-04-03 ENCOUNTER — Encounter: Payer: Self-pay | Admitting: Cardiovascular Disease

## 2018-04-03 VITALS — BP 138/90 | HR 90 | Ht 67.0 in | Wt 124.0 lb

## 2018-04-03 DIAGNOSIS — R0789 Other chest pain: Secondary | ICD-10-CM

## 2018-04-03 DIAGNOSIS — I10 Essential (primary) hypertension: Secondary | ICD-10-CM

## 2018-04-03 DIAGNOSIS — I4821 Permanent atrial fibrillation: Secondary | ICD-10-CM

## 2018-04-03 MED ORDER — APIXABAN 2.5 MG PO TABS: 2.5000 mg | ORAL_TABLET | Freq: Two times a day (BID) | ORAL | 5 refills | Status: DC

## 2018-04-03 MED ORDER — LOSARTAN POTASSIUM 100 MG PO TABS: 100.0000 mg | ORAL_TABLET | Freq: Every day | ORAL | 1 refills | Status: DC

## 2018-04-03 MED ORDER — ATORVASTATIN CALCIUM 20 MG PO TABS: 20.0000 mg | ORAL_TABLET | Freq: Every day | ORAL | 1 refills | Status: DC

## 2018-04-03 MED ORDER — METOPROLOL TARTRATE 50 MG PO TABS: 100.0000 mg | ORAL_TABLET | Freq: Two times a day (BID) | ORAL | 5 refills | Status: DC

## 2018-04-03 NOTE — Patient Instructions (Signed)
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
## 2018-04-07 ENCOUNTER — Ambulatory Visit (INDEPENDENT_AMBULATORY_CARE_PROVIDER_SITE_OTHER): Payer: Medicare HMO | Admitting: Internal Medicine

## 2018-04-07 ENCOUNTER — Encounter: Payer: Self-pay | Admitting: Internal Medicine

## 2018-04-07 VITALS — BP 130/80 | HR 95 | Temp 97.4°F | Resp 15 | Ht 67.0 in | Wt 126.0 lb

## 2018-04-07 DIAGNOSIS — E039 Hypothyroidism, unspecified: Secondary | ICD-10-CM | POA: Diagnosis not present

## 2018-04-07 DIAGNOSIS — I4891 Unspecified atrial fibrillation: Secondary | ICD-10-CM

## 2018-04-07 DIAGNOSIS — E782 Mixed hyperlipidemia: Secondary | ICD-10-CM

## 2018-04-07 DIAGNOSIS — N3941 Urge incontinence: Secondary | ICD-10-CM | POA: Diagnosis not present

## 2018-04-07 DIAGNOSIS — M810 Age-related osteoporosis without current pathological fracture: Secondary | ICD-10-CM

## 2018-04-07 DIAGNOSIS — E1321 Other specified diabetes mellitus with diabetic nephropathy: Secondary | ICD-10-CM | POA: Diagnosis not present

## 2018-04-07 DIAGNOSIS — I10 Essential (primary) hypertension: Secondary | ICD-10-CM

## 2018-04-07 MED ORDER — ZOSTER VAC RECOMB ADJUVANTED 50 MCG/0.5ML IM SUSR: 0.5000 mL | Freq: Once | INTRAMUSCULAR | 1 refills | Status: AC

## 2018-04-07 NOTE — Progress Notes (Signed)
Subjective:  Patient ID: [name], female    DOB: 17-Aug-1931  Age: 84 y.o. MRN: [id number]  CC: The primary encounter diagnosis was Urge incontinence of urine. Diagnoses of Other specified diabetes mellitus with diabetic nephropathy, without long-term current use of insulin ([place]), Mixed hyperlipidemia, Acquired hypothyroidism, Atrial fibrillation with rapid ventricular response ([place]), Essential hypertension, and Age-related osteoporosis without current pathological fracture were also pertinent to this visit.  HPI JUDIANN CELIA presents for 6 month follow up on diabetes.  Patient has no complaints today.  Patient is following a low glycemic index diet and taking all prescribed medications regularly without side effects.  Fasting sugars have been under less than 140 most of the time and post prandials have been under 160 except on rare occasions. Patient is walking for 15 minutes  7 days per week aand intentionally trying NOT to lose weight .  Patient has had an eye exam in the last 12 months and checks feet regularly for signs of infection.  Patient does not walk barefoot outside,  And denies an numbness tingling or burning in feet. Patient is up to date on all recommended vaccinations  Osteoporosis : not due for Prolia until   Dec 17th or after.    STOPPED taking thyroid medication 3 days ago (25 mcg) due to a perceived negative effect on chronic urinary frequency and urge incontinence      Outpatient Medications Prior to Visit  Medication Sig Dispense Refill  . apixaban (ELIQUIS) 2.5 MG TABS tablet Take 1 tablet (2.5 mg total) by mouth 2 (two) times daily. 60 tablet 5  . atorvastatin (LIPITOR) 20 MG tablet Take 1 tablet (20 mg total) by mouth daily. 90 tablet 1  . Biotin 5 MG CAPS Take by mouth daily.    . Blood Pressure Monitor KIT Monitor BP once daily 1 each 0  . Cholecalciferol (VITAMIN D) 2000 units CAPS Take 2,000 Units by mouth daily.    Marland Kitchen denosumab (PROLIA) 60 MG/ML SOLN  injection Inject 60 mg into the skin every 6 (six) months. Administer in upper arm, thigh, or abdomen 1 mL 0  . glipiZIDE (GLUCOTROL XL) 2.5 MG 24 hr tablet TAKE 1 TABLET BY MOUTH ONCE DAILY WITH BREAKFAST 90 tablet 1  . glucose blood (FREESTYLE LITE) test strip Use once daily to  check blood sugars   250.00  90 day supply 100 each 3  . levothyroxine (SYNTHROID, LEVOTHROID) 25 MCG tablet TAKE 1 TABLET BY MOUTH ONCE DAILY BEFORE BREAKFAST 90 tablet 1  . losartan (COZAAR) 100 MG tablet Take 1 tablet (100 mg total) by mouth daily. 90 tablet 1  . metoprolol tartrate (LOPRESSOR) 50 MG tablet Take 2 tablets (100 mg total) by mouth 2 (two) times daily. 120 tablet 5  . mirtazapine (REMERON) 30 MG tablet TAKE 1 TABLET BY MOUTH AT BEDTIME 90 tablet 1  . temazepam (RESTORIL) 15 MG capsule TAKE 1 CAPSULE BY MOUTH ONCE DAILY AT BEDTIME AS NEEDED FOR SLEEP 30 capsule 5  . vitamin B-12 (CYANOCOBALAMIN) 1000 MCG tablet Take 1,000 mcg by mouth daily.     No facility-administered medications prior to visit.     Review of Systems;  Patient denies headache, fevers, malaise, unintentional weight loss, skin rash, eye pain, sinus congestion and sinus pain, sore throat, dysphagia,  hemoptysis , cough, dyspnea, wheezing, chest pain, palpitations, orthopnea, edema, abdominal pain, nausea, melena, diarrhea, constipation, flank pain, dysuria, hematuria, urinary  Frequency, nocturia, numbness, tingling, seizures,  Focal weakness, Loss of  consciousness,  Tremor, insomnia, depression, anxiety, and suicidal ideation.      Objective:  BP 130/80 (BP Location: Right Arm, Patient Position: Sitting, Cuff Size: Normal)   Pulse 95   Temp (!) 97.4 F (36.3 C) (Oral)   Resp 15   Ht _0  (1.702 m)   Wt 126 lb (57.2 kg)   SpO2 97%   BMI 19.73 kg/m   BP Readings from Last 3 Encounters:  04/07/18 130/80  04/03/18 138/90  02/07/18 128/70    Wt Readings from Last 3 Encounters:  04/07/18 126 lb (57.2 kg)  04/03/18 124 lb  (56.2 kg)  02/07/18 127 lb 9.6 oz (57.9 kg)    General appearance: alert, cooperative and appears stated age Ears: normal TM's and external ear canals both ears Throat: lips, mucosa, and tongue normal; teeth and gums normal Neck: no adenopathy, no carotid bruit, supple, symmetrical, trachea midline and thyroid not enlarged, symmetric, no tenderness/mass/nodules Back: symmetric, no curvature. ROM normal. No CVA tenderness. Lungs: clear to auscultation bilaterally Heart: regular rate and rhythm, S1, S2 normal, no murmur, click, rub or gallop Abdomen: soft, non-tender; bowel sounds normal; no masses,  no organomegaly Pulses: 2+ and symmetric Skin: Skin color, texture, turgor normal. No rashes or lesions Lymph nodes: Cervical, supraclavicular, and axillary nodes normal.  Lab Results  Component Value Date   HGBA1C 6.4 04/07/2018   HGBA1C 6.6 (H) 10/14/2017   HGBA1C 6.8 (H) 03/18/2017    Lab Results  Component Value Date   CREATININE 1.05 04/07/2018   CREATININE 0.94 10/14/2017   CREATININE 1.07 (H) 10/01/2017    Lab Results  Component Value Date   WBC 8.5 10/14/2017   HGB 12.2 10/14/2017   HCT 35.5 (L) 10/14/2017   PLT 238.0 10/14/2017   GLUCOSE 105 (H) 04/07/2018   CHOL 119 04/07/2018   TRIG 177.0 (H) 04/07/2018   HDL 47.10 04/07/2018   LDLDIRECT 60.0 05/11/2016   LDLCALC 36 04/07/2018   ALT 10 04/07/2018   AST 18 04/07/2018   NA 140 04/07/2018   K 4.2 04/07/2018   CL 104 04/07/2018   CREATININE 1.05 04/07/2018   BUN 27 (H) 04/07/2018   CO2 28 04/07/2018   TSH 3.27 04/07/2018   HGBA1C 6.4 04/07/2018   MICROALBUR 3.9 (H) 10/14/2017    Dg Hip Unilat With Pelvis 2-3 Views Left  Result Date: 02/07/2018 CLINICAL DATA:  Left hip pain, atraumatic. EXAM: DG HIP (WITH OR WITHOUT PELVIS) 2-3V LEFT COMPARISON:  None. FINDINGS: Mild acetabular spurring on both sides. No definite or asymmetric joint narrowing. There is no evidence of fracture or bone lesion. Osteopenia.  Intact pelvic ring. IMPRESSION: 1. Symmetric degenerative hip spurring without definite joint narrowing. 2. No acute finding. Electronically Signed   By: Monte Fantasia M.D.   On: 02/07/2018 16:35    Assessment & Plan:   Problem List Items Addressed This Visit    Atrial fibrillation with rapid ventricular response ([place])     She is asymptomatic currently on metoprolol 100 mg bid,   Tolerating Eliquis for mitigation of embolic stroke risk        Diabetes mellitus with diabetic nephropathy ([place])    Currently well-controlled on current medications .  hemoglobin A1c is at goal of less than 7.0 . Patient is reminded to schedule an annual eye exam and foot exam is up to date and normal .  Patient has early microalbuminuria. Patient is tolerating statin therapy for CAD risk reduction and on ACE/ARB for renal protection and  hypertension   Lab Results  Component Value Date   HGBA1C 6.4 04/07/2018   Lab Results  Component Value Date   MICROALBUR 3.9 (H) 10/14/2017         Relevant Orders   Comprehensive metabolic panel (Completed)   Hemoglobin A1c (Completed)   Hyperlipidemia   Relevant Orders   Lipid panel (Completed)   Hypertension    Well controlled on current regimen. Renal function stable, no changes today.  Lab Results  Component Value Date   CREATININE 1.05 04/07/2018   Lab Results  Component Value Date   NA 140 04/07/2018   K 4.2 04/07/2018   CL 104 04/07/2018   CO2 28 04/07/2018         Hypothyroid    Stopping supplementation as she has not tolerated 25 mcg dose      Relevant Orders   TSH (Completed)   Osteoporosis    SS increase in BMD of hip since starting Prolia  Last DEXA May 2019       Other Visit Diagnoses    Urge incontinence of urine    -  Primary   Relevant Orders   Urinalysis, Routine w reflex microscopic   Urine Culture    A total of 25 minutes of face to face time was spent with patient more than half of which was spent in counselling about  the above mentioned conditions  and coordination of care   I am having Tasmine C. Mccarney start on Zoster Vaccine Adjuvanted. I am also having her maintain her glucose blood, Blood Pressure Monitor, denosumab, Vitamin D, Biotin, vitamin B-12, temazepam, levothyroxine, mirtazapine, glipiZIDE, apixaban, atorvastatin, losartan, and metoprolol tartrate.  Meds ordered this encounter  Medications  . Zoster Vaccine Adjuvanted [facility]) injection    Sig: Inject 0.5 mLs into the muscle once for 1 dose.    Dispense:  1 each    Refill:  1    There are no discontinued medications.  Follow-up: No follow-ups on file.   Crecencio Mc, MD

## 2018-04-07 NOTE — Patient Instructions (Signed)
You are having "urge incontinence" .  This can be treated ,  But infection must be rule dout fist.  Please drop off  Or bring a urine sample with you when you return for your Prolia injection on or after Dec 17th.   We Will schedule your diabetes follow up [place]

## 2018-04-08 LAB — LIPID PANEL
CHOLESTEROL: 119 mg/dL (ref 0–200)
HDL: 47.1 mg/dL (ref >39.00)
LDL CALC: 36 mg/dL (ref 0–99)
NonHDL: 71.46
Total CHOL/HDL Ratio: 3
Triglycerides: 177 mg/dL — ABNORMAL HIGH (ref 0.0–149.0)
VLDL: 35.4 mg/dL (ref 0.0–40.0)

## 2018-04-08 LAB — TSH: TSH: 3.27 u[IU]/mL (ref 0.35–4.50)

## 2018-04-08 LAB — COMPREHENSIVE METABOLIC PANEL
ALT: 10 U/L (ref 0–35)
AST: 18 U/L (ref 0–37)
Albumin: 4.5 g/dL (ref 3.5–5.2)
Alkaline Phosphatase: 67 U/L (ref 39–117)
BILIRUBIN TOTAL: 0.4 mg/dL (ref 0.2–1.2)
BUN: 27 mg/dL — ABNORMAL HIGH (ref 6–23)
CALCIUM: 9.8 mg/dL (ref 8.4–10.5)
CHLORIDE: 104 meq/L (ref 96–112)
CO2: 28 meq/L (ref 19–32)
Creatinine, Ser: 1.05 mg/dL (ref 0.40–1.20)
GFR: 53.03 mL/min — ABNORMAL LOW (ref >60.00)
Glucose, Bld: 105 mg/dL — ABNORMAL HIGH (ref 70–99)
Potassium: 4.2 mEq/L (ref 3.5–5.1)
Sodium: 140 mEq/L (ref 135–145)
Total Protein: 7.9 g/dL (ref 6.0–8.3)

## 2018-04-08 LAB — HEMOGLOBIN A1C: Hgb A1c MFr Bld: 6.4 % (ref 4.6–6.5)

## 2018-04-08 NOTE — Assessment & Plan Note (Signed)
Well controlled on current regimen. Renal function stable, no changes today.  Lab Results  Component Value Date   CREATININE 1.05 04/07/2018   Lab Results  Component Value Date   NA 140 04/07/2018   K 4.2 04/07/2018   CL 104 04/07/2018   CO2 28 04/07/2018

## 2018-04-08 NOTE — Assessment & Plan Note (Addendum)
Currently well-controlled on current medications .  hemoglobin A1c is at goal of less than 7.0 . Patient is reminded to schedule an annual eye exam and foot exam is up to date and normal .  Patient has early microalbuminuria. Patient is tolerating statin therapy for CAD risk reduction and on ACE/ARB for renal protection and hypertension   Lab Results  Component Value Date   HGBA1C 6.4 04/07/2018   Lab Results  Component Value Date   MICROALBUR 3.9 (H) 10/14/2017

## 2018-04-08 NOTE — Assessment & Plan Note (Signed)
SS increase in BMD of hip since starting Prolia  Last DEXA May 2019

## 2018-04-08 NOTE — Assessment & Plan Note (Signed)
Stopping supplementation as she has not tolerated 25 mcg dose

## 2018-04-08 NOTE — Assessment & Plan Note (Signed)
She is asymptomatic currently on metoprolol 100 mg bid,   Tolerating Eliquis for mitigation of embolic stroke risk

## 2018-04-09 ENCOUNTER — Telehealth: Payer: Self-pay

## 2018-04-09 NOTE — Telephone Encounter (Signed)
Copied from [place] [id number]. Topic: Quick Communication - See Telephone Encounter >> Apr 09, 2018  1:59 PM Hewitt Shorts wrote: Pt is calling to see when she can schedule her prolia shot   Best number [phone]

## 2018-04-10 NOTE — Telephone Encounter (Signed)
Patient scheduled for 04/18/18

## 2018-04-18 ENCOUNTER — Other Ambulatory Visit (INDEPENDENT_AMBULATORY_CARE_PROVIDER_SITE_OTHER): Payer: Medicare HMO

## 2018-04-18 ENCOUNTER — Ambulatory Visit (INDEPENDENT_AMBULATORY_CARE_PROVIDER_SITE_OTHER): Payer: Medicare HMO | Admitting: *Deleted

## 2018-04-18 DIAGNOSIS — N3941 Urge incontinence: Secondary | ICD-10-CM | POA: Diagnosis not present

## 2018-04-18 DIAGNOSIS — M810 Age-related osteoporosis without current pathological fracture: Secondary | ICD-10-CM | POA: Diagnosis not present

## 2018-04-18 LAB — URINALYSIS, ROUTINE W REFLEX MICROSCOPIC
Bilirubin Urine: NEGATIVE
Ketones, ur: NEGATIVE
Nitrite: POSITIVE — AB
Specific Gravity, Urine: 1.025 (ref 1.000–1.030)
Urine Glucose: NEGATIVE
Urobilinogen, UA: 0.2 (ref 0.0–1.0)
pH: 6 (ref 5.0–8.0)

## 2018-04-18 MED ORDER — DENOSUMAB 60 MG/ML ~~LOC~~ SOSY
60.0000 mg | PREFILLED_SYRINGE | Freq: Once | SUBCUTANEOUS | Status: AC
  Administered 2018-04-18: 60 mg via SUBCUTANEOUS

## 2018-04-18 NOTE — Progress Notes (Signed)
Patient presented for Prolia injection to Right arm [place], patient voiced no concerns or complaints during or after injection. 

## 2018-04-20 LAB — URINE CULTURE
MICRO NUMBER:: 91526257
SPECIMEN QUALITY:: ADEQUATE

## 2018-04-21 ENCOUNTER — Other Ambulatory Visit: Payer: Self-pay | Admitting: Internal Medicine

## 2018-04-21 MED ORDER — CIPROFLOXACIN HCL 250 MG PO TABS: 250.0000 mg | ORAL_TABLET | Freq: Two times a day (BID) | ORAL | 0 refills | Status: DC

## 2018-04-21 NOTE — Progress Notes (Signed)
cipro sent to [facility] in [place]  [employer] sent

## 2018-05-24 ENCOUNTER — Other Ambulatory Visit: Payer: Self-pay | Admitting: Internal Medicine

## 2018-06-23 ENCOUNTER — Other Ambulatory Visit: Payer: Self-pay | Admitting: Internal Medicine

## 2018-06-25 ENCOUNTER — Other Ambulatory Visit: Payer: Self-pay | Admitting: Internal Medicine

## 2018-06-25 MED ORDER — TEMAZEPAM 15 MG PO CAPS: ORAL_CAPSULE | ORAL | 5 refills | Status: DC

## 2018-08-21 ENCOUNTER — Other Ambulatory Visit: Payer: Self-pay | Admitting: Internal Medicine

## 2018-08-21 DIAGNOSIS — E039 Hypothyroidism, unspecified: Secondary | ICD-10-CM

## 2018-09-16 ENCOUNTER — Other Ambulatory Visit: Payer: Self-pay | Admitting: Internal Medicine

## 2018-10-05 ENCOUNTER — Other Ambulatory Visit: Payer: Self-pay | Admitting: Internal Medicine

## 2018-10-10 ENCOUNTER — Telehealth: Payer: Self-pay

## 2018-10-10 NOTE — Telephone Encounter (Signed)

## 2018-10-16 ENCOUNTER — Encounter: Payer: Self-pay | Admitting: Cardiovascular Disease

## 2018-10-16 ENCOUNTER — Other Ambulatory Visit: Payer: Self-pay

## 2018-10-16 ENCOUNTER — Telehealth (INDEPENDENT_AMBULATORY_CARE_PROVIDER_SITE_OTHER): Payer: Medicare HMO | Admitting: Cardiovascular Disease

## 2018-10-16 VITALS — HR 96 | Ht 67.0 in | Wt 115.0 lb

## 2018-10-16 DIAGNOSIS — I10 Essential (primary) hypertension: Secondary | ICD-10-CM

## 2018-10-16 DIAGNOSIS — I482 Chronic atrial fibrillation, unspecified: Secondary | ICD-10-CM | POA: Diagnosis not present

## 2018-10-16 NOTE — Patient Instructions (Signed)
Medication Instructions:  Continue same medications If you need a refill on your cardiac medications before your next appointment, please call your pharmacy.   Lab work: None If you have labs (blood work) drawn today and your tests are completely normal, you will receive your results only by: . MyChart Message (if you have MyChart) OR . A paper copy in the mail If you have any lab test that is abnormal or we need to change your treatment, we will call you to review the results.  Testing/Procedures: None  Follow-Up: At [facility], you and your health needs are our priority.  As part of our continuing mission to provide you with exceptional heart care, we have created designated Provider Care Teams.  These Care Teams include your primary Cardiologist (physician) and Advanced Practice Providers (APPs -  Physician Assistants and Nurse Practitioners) who all work together to provide you with the care you need, when you need it. You will need a follow up appointment in 6 months.  Please call our office 2 months in advance to schedule this appointment.  You may see Ashtan Laton, MD or one of the following Advanced Practice Providers on your designated Care Team:   Christopher Berge, NP Ryan Dunn, PA-C . Jacquelyn Visser, PA-C   
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
## 2018-10-20 ENCOUNTER — Telehealth: Payer: Self-pay | Admitting: Internal Medicine

## 2018-10-20 DIAGNOSIS — E039 Hypothyroidism, unspecified: Secondary | ICD-10-CM

## 2018-10-20 DIAGNOSIS — R5383 Other fatigue: Secondary | ICD-10-CM

## 2018-10-20 DIAGNOSIS — D518 Other vitamin B12 deficiency anemias: Secondary | ICD-10-CM

## 2018-10-20 DIAGNOSIS — D508 Other iron deficiency anemias: Secondary | ICD-10-CM

## 2018-10-20 NOTE — Telephone Encounter (Signed)
Son cannot bring patient until Friday labs scheduled for Friday morning, and  because cortisol has to be drawn in AM. So scheduled for Friday and will do Prolia injection on Friday.Cheyenne Torres

## 2018-10-20 NOTE — Telephone Encounter (Signed)
Patient has had no appetite and feeling tired for about a month now, denies no nausea , says she just tired she wants to stay in the recliner. Denies shortness of breathe. Patient says her tongue feels like she has eaten something hot and burned her tongue this is what started when she lost appetite and started feeling tired. Last she weighed Wednesday on home scales 118 LB ( son advised Dr. Fletcher Anon at virtual visit last week patient weighs) Patient is scheduled for virtual telephone on 10/22/18.Due for Prolia. Ready to schedule. Will follow up after visit.

## 2018-10-20 NOTE — Telephone Encounter (Signed)
Labs ordered.  If son can bring her for labs prior to visit,  please schedule

## 2018-10-22 ENCOUNTER — Other Ambulatory Visit: Payer: Self-pay

## 2018-10-22 ENCOUNTER — Encounter: Payer: Self-pay | Admitting: Internal Medicine

## 2018-10-22 ENCOUNTER — Ambulatory Visit (INDEPENDENT_AMBULATORY_CARE_PROVIDER_SITE_OTHER): Payer: Medicare HMO | Admitting: Internal Medicine

## 2018-10-22 DIAGNOSIS — R69 Illness, unspecified: Secondary | ICD-10-CM | POA: Diagnosis not present

## 2018-10-22 DIAGNOSIS — K146 Glossodynia: Secondary | ICD-10-CM

## 2018-10-22 DIAGNOSIS — R5383 Other fatigue: Secondary | ICD-10-CM | POA: Diagnosis not present

## 2018-10-22 DIAGNOSIS — F320 Major depressive disorder, single episode, mild: Secondary | ICD-10-CM | POA: Diagnosis not present

## 2018-10-22 DIAGNOSIS — E1321 Other specified diabetes mellitus with diabetic nephropathy: Secondary | ICD-10-CM

## 2018-10-22 DIAGNOSIS — R682 Dry mouth, unspecified: Secondary | ICD-10-CM | POA: Diagnosis not present

## 2018-10-22 MED ORDER — TEMAZEPAM 7.5 MG PO CAPS: 7.5000 mg | ORAL_CAPSULE | Freq: Every evening | ORAL | 0 refills | Status: DC | PRN

## 2018-10-22 NOTE — Assessment & Plan Note (Addendum)
Her lack of motivation to exercise does not appear to be due to pain, hypoxia or uncontrolled atrial fib (based on son's observation of her pulse )  and therefore is presumed to be due to worsening depression,  Increasing  dose of remeron to 45 mg today . Reducing temazepam dose to 7.5 mg daily

## 2018-10-22 NOTE — Assessment & Plan Note (Signed)
Likely due to mouth breathing at night vs medications.  But will check for Sjogrens' syndrome

## 2018-10-22 NOTE — Assessment & Plan Note (Signed)
Historically  well-controlled on current medications .  hemoglobin A1c is at goal of less than 7.0 . Patient is reminded to schedule an annual eye exam and foot exam is up to date and normal .  Patient has early microalbuminuria. Patient is tolerating statin therapy for CAD risk reduction and on ACE/ARB for renal protection and hypertension   Lab Results  Component Value Date   HGBA1C 6.4 04/07/2018   Lab Results  Component Value Date   MICROALBUR 3.9 (H) 10/14/2017
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
## 2018-10-22 NOTE — Assessment & Plan Note (Signed)
ruling out anemia,  Addison's disease, thyroid dysfunction

## 2018-10-24 ENCOUNTER — Other Ambulatory Visit: Payer: Self-pay

## 2018-10-24 ENCOUNTER — Ambulatory Visit (INDEPENDENT_AMBULATORY_CARE_PROVIDER_SITE_OTHER): Payer: Medicare HMO | Admitting: *Deleted

## 2018-10-24 ENCOUNTER — Other Ambulatory Visit (INDEPENDENT_AMBULATORY_CARE_PROVIDER_SITE_OTHER): Payer: Medicare HMO

## 2018-10-24 DIAGNOSIS — K146 Glossodynia: Secondary | ICD-10-CM

## 2018-10-24 DIAGNOSIS — D518 Other vitamin B12 deficiency anemias: Secondary | ICD-10-CM

## 2018-10-24 DIAGNOSIS — E1321 Other specified diabetes mellitus with diabetic nephropathy: Secondary | ICD-10-CM

## 2018-10-24 DIAGNOSIS — E039 Hypothyroidism, unspecified: Secondary | ICD-10-CM

## 2018-10-24 DIAGNOSIS — D508 Other iron deficiency anemias: Secondary | ICD-10-CM

## 2018-10-24 DIAGNOSIS — R5383 Other fatigue: Secondary | ICD-10-CM

## 2018-10-24 DIAGNOSIS — M810 Age-related osteoporosis without current pathological fracture: Secondary | ICD-10-CM | POA: Diagnosis not present

## 2018-10-24 LAB — COMPREHENSIVE METABOLIC PANEL
ALT: 11 U/L (ref 0–35)
AST: 14 U/L (ref 0–37)
Albumin: 4.3 g/dL (ref 3.5–5.2)
Alkaline Phosphatase: 76 U/L (ref 39–117)
BUN: 18 mg/dL (ref 6–23)
CO2: 28 mEq/L (ref 19–32)
Calcium: 9.1 mg/dL (ref 8.4–10.5)
Chloride: 103 mEq/L (ref 96–112)
Creatinine, Ser: 0.92 mg/dL (ref 0.40–1.20)
GFR: 58.03 mL/min — ABNORMAL LOW (ref >60.00)
Glucose, Bld: 134 mg/dL — ABNORMAL HIGH (ref 70–99)
Potassium: 4.3 mEq/L (ref 3.5–5.1)
Sodium: 141 mEq/L (ref 135–145)
Total Bilirubin: 0.6 mg/dL (ref 0.2–1.2)
Total Protein: 7.4 g/dL (ref 6.0–8.3)

## 2018-10-24 LAB — CBC WITH DIFFERENTIAL/PLATELET
Basophils Absolute: 0 10*3/uL (ref 0.0–0.1)
Basophils Relative: 0.5 % (ref 0.0–3.0)
Eosinophils Absolute: 0.1 10*3/uL (ref 0.0–0.7)
Eosinophils Relative: 1.4 % (ref 0.0–5.0)
HCT: 35.8 % — ABNORMAL LOW (ref 36.0–46.0)
Hemoglobin: 12 g/dL (ref 12.0–15.0)
Lymphocytes Relative: 30.5 % (ref 12.0–46.0)
Lymphs Abs: 2.3 10*3/uL (ref 0.7–4.0)
MCHC: 33.5 g/dL (ref 30.0–36.0)
MCV: 88.2 fl (ref 78.0–100.0)
Monocytes Absolute: 0.4 10*3/uL (ref 0.1–1.0)
Monocytes Relative: 5.8 % (ref 3.0–12.0)
Neutro Abs: 4.8 10*3/uL (ref 1.4–7.7)
Neutrophils Relative %: 61.8 % (ref 43.0–77.0)
Platelets: 261 10*3/uL (ref 150.0–400.0)
RBC: 4.06 Mil/uL (ref 3.87–5.11)
RDW: 13.7 % (ref 11.5–15.5)
WBC: 7.7 10*3/uL (ref 4.0–10.5)

## 2018-10-24 LAB — IBC + FERRITIN
Ferritin: 88.9 ng/mL (ref 10.0–291.0)
Iron: 43 ug/dL (ref 42–145)
Saturation Ratios: 14.4 % — ABNORMAL LOW (ref 20.0–50.0)
Transferrin: 213 mg/dL (ref 212.0–360.0)

## 2018-10-24 LAB — CORTISOL: Cortisol, Plasma: 15.5 ug/dL

## 2018-10-24 LAB — TSH: TSH: 2.76 u[IU]/mL (ref 0.35–4.50)

## 2018-10-24 LAB — VITAMIN B12: Vitamin B-12: >1515 pg/mL — ABNORMAL HIGH (ref 211–911)

## 2018-10-24 LAB — HEMOGLOBIN A1C: Hgb A1c MFr Bld: 6.7 % — ABNORMAL HIGH (ref 4.6–6.5)

## 2018-10-24 MED ORDER — DENOSUMAB 60 MG/ML ~~LOC~~ SOSY
60.0000 mg | PREFILLED_SYRINGE | Freq: Once | SUBCUTANEOUS | Status: AC
  Administered 2018-10-24: 60 mg via SUBCUTANEOUS

## 2018-10-24 NOTE — Progress Notes (Signed)
Patient presented for Prolia injection to Right arm [employer], patient voiced no concerns or complaints during or after injection. 

## 2018-10-28 ENCOUNTER — Other Ambulatory Visit: Payer: Self-pay | Admitting: Internal Medicine

## 2018-10-28 LAB — SJOGRENS SYNDROME-B EXTRACTABLE NUCLEAR ANTIBODY: SSB (La) (ENA) Antibody, IgG: <1 AI

## 2018-10-28 LAB — VITAMIN B6: Vitamin B6: 3.2 ng/mL (ref 2.1–21.7)

## 2018-10-28 LAB — SJOGRENS SYNDROME-A EXTRACTABLE NUCLEAR ANTIBODY: SSA (Ro) (ENA) Antibody, IgG: <1 AI

## 2018-10-28 MED ORDER — PYRIDOXINE HCL 500 MG PO TABS: 500.0000 mg | ORAL_TABLET | Freq: Every day | ORAL | 1 refills | Status: AC

## 2018-11-24 ENCOUNTER — Other Ambulatory Visit: Payer: Self-pay | Admitting: Internal Medicine

## 2018-11-24 DIAGNOSIS — E039 Hypothyroidism, unspecified: Secondary | ICD-10-CM

## 2018-11-27 ENCOUNTER — Other Ambulatory Visit: Payer: Self-pay | Admitting: Cardiovascular Disease

## 2018-11-27 NOTE — Telephone Encounter (Signed)
Eliquis 2.5mg  refill request received; pt is 85 yrs old, wt-54.4kg, Crea-0.92 on 10/24/2018, last seen by Dr. Fletcher Anon on 10/16/2018, diagnosis Afib; will send in refill to requested pharmacy.

## 2018-11-27 NOTE — Telephone Encounter (Signed)
Refill Request.  

## 2018-12-04 ENCOUNTER — Other Ambulatory Visit: Payer: Self-pay | Admitting: Cardiovascular Disease

## 2018-12-22 ENCOUNTER — Other Ambulatory Visit: Payer: Self-pay | Admitting: Internal Medicine

## 2018-12-22 NOTE — Telephone Encounter (Signed)
Refilled: 10/22/2018 Last OV: 10/22/2018 Next OV: not scheduled

## 2019-01-21 ENCOUNTER — Other Ambulatory Visit: Payer: Self-pay | Admitting: Internal Medicine

## 2019-01-22 DIAGNOSIS — R69 Illness, unspecified: Secondary | ICD-10-CM | POA: Diagnosis not present

## 2019-02-20 ENCOUNTER — Other Ambulatory Visit: Payer: Self-pay | Admitting: Cardiovascular Disease

## 2019-03-17 ENCOUNTER — Other Ambulatory Visit: Payer: Self-pay | Admitting: Cardiovascular Disease

## 2019-04-13 ENCOUNTER — Other Ambulatory Visit: Payer: Self-pay | Admitting: Cardiovascular Disease

## 2019-04-13 ENCOUNTER — Other Ambulatory Visit: Payer: Self-pay | Admitting: Internal Medicine

## 2019-04-17 ENCOUNTER — Ambulatory Visit (INDEPENDENT_AMBULATORY_CARE_PROVIDER_SITE_OTHER): Payer: Medicare HMO | Admitting: Cardiovascular Disease

## 2019-04-17 ENCOUNTER — Other Ambulatory Visit: Payer: Self-pay

## 2019-04-17 ENCOUNTER — Encounter: Payer: Self-pay | Admitting: Cardiovascular Disease

## 2019-04-17 VITALS — BP 102/68 | HR 96 | Ht 67.0 in | Wt 118.0 lb

## 2019-04-17 DIAGNOSIS — I482 Chronic atrial fibrillation, unspecified: Secondary | ICD-10-CM | POA: Diagnosis not present

## 2019-04-17 DIAGNOSIS — I10 Essential (primary) hypertension: Secondary | ICD-10-CM

## 2019-04-17 NOTE — Patient Instructions (Signed)
Medication Instructions:  No changes  *If you need a refill on your cardiac medications before your next appointment, please call your pharmacy*   Lab Work: None If you have labs (blood work) drawn today and your tests are completely normal, you will receive your results only by: . MyChart Message (if you have MyChart) OR . A paper copy in the mail If you have any lab test that is abnormal or we need to change your treatment, we will call you to review the results.   Testing/Procedures: None   Follow-Up: At [facility], you and your health needs are our priority.  As part of our continuing mission to provide you with exceptional heart care, we have created designated Provider Care Teams.  These Care Teams include your primary Cardiologist (physician) and Advanced Practice Providers (APPs -  Physician Assistants and Nurse Practitioners) who all work together to provide you with the care you need, when you need it.  Your next appointment:   6 month(s)  The format for your next appointment:   In Person  Provider:    You may see Muhammad Arida, MD or one of the following Advanced Practice Providers on your designated Care Team:    Christopher Berge, NP  Ryan Dunn, PA-C  Jacquelyn Visser, PA-C   
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
## 2019-04-27 ENCOUNTER — Other Ambulatory Visit: Payer: Self-pay

## 2019-05-04 ENCOUNTER — Ambulatory Visit (INDEPENDENT_AMBULATORY_CARE_PROVIDER_SITE_OTHER): Payer: Medicare HMO | Admitting: Internal Medicine

## 2019-05-04 ENCOUNTER — Encounter: Payer: Self-pay | Admitting: Internal Medicine

## 2019-05-04 ENCOUNTER — Other Ambulatory Visit: Payer: Self-pay

## 2019-05-04 VITALS — BP 178/90 | HR 92 | Temp 95.9°F | Resp 16 | Ht 67.0 in | Wt 117.4 lb

## 2019-05-04 DIAGNOSIS — E039 Hypothyroidism, unspecified: Secondary | ICD-10-CM

## 2019-05-04 DIAGNOSIS — F320 Major depressive disorder, single episode, mild: Secondary | ICD-10-CM

## 2019-05-04 DIAGNOSIS — E782 Mixed hyperlipidemia: Secondary | ICD-10-CM

## 2019-05-04 DIAGNOSIS — M810 Age-related osteoporosis without current pathological fracture: Secondary | ICD-10-CM

## 2019-05-04 DIAGNOSIS — D500 Iron deficiency anemia secondary to blood loss (chronic): Secondary | ICD-10-CM | POA: Diagnosis not present

## 2019-05-04 DIAGNOSIS — R69 Illness, unspecified: Secondary | ICD-10-CM | POA: Diagnosis not present

## 2019-05-04 DIAGNOSIS — E1121 Type 2 diabetes mellitus with diabetic nephropathy: Secondary | ICD-10-CM

## 2019-05-04 DIAGNOSIS — I10 Essential (primary) hypertension: Secondary | ICD-10-CM | POA: Diagnosis not present

## 2019-05-04 LAB — CBC WITH DIFFERENTIAL/PLATELET
Basophils Absolute: 0.1 10*3/uL (ref 0.0–0.1)
Basophils Relative: 0.6 % (ref 0.0–3.0)
Eosinophils Absolute: 0.1 10*3/uL (ref 0.0–0.7)
Eosinophils Relative: 1.1 % (ref 0.0–5.0)
HCT: 36.8 % (ref 36.0–46.0)
Hemoglobin: 12.4 g/dL (ref 12.0–15.0)
Lymphocytes Relative: 34.6 % (ref 12.0–46.0)
Lymphs Abs: 3.4 10*3/uL (ref 0.7–4.0)
MCHC: 33.7 g/dL (ref 30.0–36.0)
MCV: 90 fl (ref 78.0–100.0)
Monocytes Absolute: 0.6 10*3/uL (ref 0.1–1.0)
Monocytes Relative: 5.6 % (ref 3.0–12.0)
Neutro Abs: 5.7 10*3/uL (ref 1.4–7.7)
Neutrophils Relative %: 58.1 % (ref 43.0–77.0)
Platelets: 231 10*3/uL (ref 150.0–400.0)
RBC: 4.08 Mil/uL (ref 3.87–5.11)
RDW: 13.9 % (ref 11.5–15.5)
WBC: 9.9 10*3/uL (ref 4.0–10.5)

## 2019-05-04 LAB — LIPID PANEL
Cholesterol: 131 mg/dL (ref 0–200)
HDL: 52.9 mg/dL (ref >39.00)
LDL Cholesterol: 52 mg/dL (ref 0–99)
NonHDL: 78.38
Total CHOL/HDL Ratio: 2
Triglycerides: 134 mg/dL (ref 0.0–149.0)
VLDL: 26.8 mg/dL (ref 0.0–40.0)

## 2019-05-04 LAB — COMPREHENSIVE METABOLIC PANEL
ALT: 21 U/L (ref 0–35)
AST: 23 U/L (ref 0–37)
Albumin: 4.5 g/dL (ref 3.5–5.2)
Alkaline Phosphatase: 60 U/L (ref 39–117)
BUN: 23 mg/dL (ref 6–23)
CO2: 27 mEq/L (ref 19–32)
Calcium: 9.7 mg/dL (ref 8.4–10.5)
Chloride: 104 mEq/L (ref 96–112)
Creatinine, Ser: 0.89 mg/dL (ref 0.40–1.20)
GFR: 60.22 mL/min (ref >60.00)
Glucose, Bld: 124 mg/dL — ABNORMAL HIGH (ref 70–99)
Potassium: 3.9 mEq/L (ref 3.5–5.1)
Sodium: 141 mEq/L (ref 135–145)
Total Bilirubin: 0.5 mg/dL (ref 0.2–1.2)
Total Protein: 7.9 g/dL (ref 6.0–8.3)

## 2019-05-04 LAB — TSH: TSH: 2.08 u[IU]/mL (ref 0.35–4.50)

## 2019-05-04 LAB — POCT GLYCOSYLATED HEMOGLOBIN (HGB A1C): Hemoglobin A1C: 5.9 % — AB (ref 4.0–5.6)

## 2019-05-04 MED ORDER — DENOSUMAB 60 MG/ML ~~LOC~~ SOSY
60.0000 mg | PREFILLED_SYRINGE | Freq: Once | SUBCUTANEOUS | Status: AC
  Administered 2019-05-04: 60 mg via SUBCUTANEOUS

## 2019-05-04 MED ORDER — MIRTAZAPINE 45 MG PO TABS: 45.0000 mg | ORAL_TABLET | Freq: Every day | ORAL | 1 refills | Status: DC
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
## 2019-05-04 NOTE — Patient Instructions (Addendum)
I have not changed your blood pressure medications,  But I want you to start taking the losartan at bedtime  I have increased the mirtazipine to 45 mg to help your energy and lack of appetite   Let me know the name of the cream that is making your feet hurt less   Diabetic Neuropathy Diabetic neuropathy refers to nerve damage that is caused by diabetes (diabetes mellitus). Over time, people with diabetes can develop nerve damage throughout the body. There are several types of diabetic neuropathy:  Peripheral neuropathy. This is the most common type of diabetic neuropathy. It causes damage to nerves that carry signals between the spinal cord and other parts of the body (peripheral nerves). This usually affects nerves in the feet and legs first, and may eventually affect the hands and arms. The damage affects the ability to sense touch or temperature.  Autonomic neuropathy. This type causes damage to nerves that control involuntary functions (autonomic nerves). These nerves carry signals that control: ? Heartbeat. ? Body temperature. ? Blood pressure. ? Urination. ? Digestion. ? Sweating. ? Sexual function. ? Response to changing blood sugar (glucose) levels.  Focal neuropathy. This type of nerve damage affects one area of the body, such as an arm, a leg, or the face. The injury may involve one nerve or a small group of nerves. Focal neuropathy can be painful and unpredictable, and occurs most often in older adults with diabetes. This often develops suddenly, but usually improves over time and does not cause long-term problems.  Proximal neuropathy. This type of nerve damage affects the nerves of the thighs, hips, buttocks, or legs. It causes severe pain, weakness, and muscle death (atrophy), usually in the thigh muscles. It is more common among older men and people who have type 2 diabetes. The length of recovery time may vary. What are the causes? Peripheral, autonomic, and focal  neuropathies are caused by diabetes that is not well controlled with treatment. The cause of proximal neuropathy is not known, but it may be caused by inflammation related to uncontrolled blood glucose levels. What are the signs or symptoms? Peripheral neuropathy Peripheral neuropathy develops slowly over time. When the nerves of the feet and legs no longer work, you may experience:  Burning, stabbing, or aching pain in the legs or feet.  Pain or cramping in the legs or feet.  Loss of feeling (numbness) and inability to feel pressure or pain in the feet. This can lead to: ? Thick calluses or sores on areas of constant pressure. ? Ulcers. ? Reduced ability to feel temperature changes.  Foot deformities.  Muscle weakness.  Loss of balance or coordination. Autonomic neuropathy The symptoms of autonomic neuropathy vary depending on which nerves are affected. Symptoms may include:  Problems with digestion, such as: ? Nausea or vomiting. ? Poor appetite. ? Bloating. ? Diarrhea or constipation. ? Trouble swallowing. ? Losing weight without trying to.  Problems with the heart, blood and lungs, such as: ? Dizziness, especially when standing up. ? Fainting. ? Shortness of breath. ? Irregular heartbeat.  Bladder problems, such as: ? Trouble starting or stopping urination. ? Leaking urine. ? Trouble emptying the bladder. ? Urinary tract infections (UTIs).  Problems with other body functions, such as: ? Sweat. You may sweat too much or too little. ? Temperature. You might get hot easily. Or, you might feel cold more than usual. ? Sexual function. Men may not be able to get or maintain an erection. Women may have vaginal  dryness and difficulty with arousal. Focal neuropathy Symptoms affect only one area of the body. Common symptoms include:  Numbness.  Tingling.  Burning pain.  Prickling feeling.  Very sensitive skin.  Weakness.  Inability to move (paralysis).  Muscle  twitching.  Muscles getting smaller (wasting).  Poor coordination.  Double or blurred vision. Proximal neuropathy  Sudden, severe pain in the hip, thigh, or buttocks. Pain may spread from the back into the legs (sciatica).  Pain and numbness in the arms and legs.  Tingling.  Loss of bladder control or bowel control.  Weakness and wasting of thigh muscles.  Difficulty getting up from a seated position.  Abdominal swelling.  Unexplained weight loss. How is this diagnosed? Diagnosis usually involves reviewing your medical history and any symptoms you have. Diagnosis varies depending on the type of neuropathy your health care provider suspects. Peripheral neuropathy Your health care provider will check areas that are affected by your nervous system (neurologic exam), such as your reflexes, how you move, and what you can feel. You may have other tests, such as:  Blood tests.  Removal and examination of fluid that surrounds the spinal cord (lumbar puncture).  CT scan.  MRI.  A test to check the nerves that control muscles (electromyogram, EMG).  Tests of how quickly messages pass through your nerves (nerve conduction velocity tests).  Removal of a small piece of nerve to be examined under a microscope (biopsy). Autonomic neuropathy You may have tests, such as:  Tests to measure your blood pressure and heart rate. This may include monitoring you while you are safely secured to an exam table that moves you from a lying position to an upright position (table tilt test).  Breathing tests to check your lungs.  Tests to check how food moves through the digestive system (gastric emptying tests).  Blood, sweat, or urine tests.  Ultrasound of your bladder.  Spinal fluid tests. Focal neuropathy This condition may be diagnosed with:  A neurologic exam.  CT scan.  MRI.  EMG.  Nerve conduction velocity tests. Proximal neuropathy There is no test to diagnose this type  of neuropathy. You may have tests to rule out other possible causes of this type of neuropathy. Tests may include:  X-rays of your spine and lumbar region.  Lumbar puncture.  MRI. How is this treated? The goal of treatment is to keep nerve damage from getting worse. The most important part of treatment is keeping your blood glucose level and your A1C level within your target range by following your diabetes management plan. Over time, maintaining lower blood glucose levels helps lessen symptoms. In some cases, you may need prescription pain medicine. Follow these instructions at home:  Lifestyle   Do not use any products that contain nicotine or tobacco, such as cigarettes and e-cigarettes. If you need help quitting, ask your health care provider.  Be physically active every day. Include strength training and balance exercises.  Follow a healthy meal plan.  Work with your health care provider to manage your blood pressure. General instructions  Follow your diabetes management plan as directed. ? Check your blood glucose levels as directed by your health care provider. ? Keep your blood glucose in your target range as directed by your health care provider. ? Have your A1C level checked at least two times a year, or as often as told by your health care provider.  Take over the counter and prescription medicines only as told by your health care provider. This  includes insulin and diabetes medicine.  Do not drive or use heavy machinery while taking prescription pain medicines.  Check your skin and feet every day for cuts, bruises, redness, blisters, or sores.  Keep all follow up visits as told by your health care provider. This is important. Contact a health care provider if:  You have burning, stabbing, or aching pain in your legs or feet.  You are unable to feel pressure or pain in your feet.  You develop problems with digestion, such  as: ? Nausea. ? Vomiting. ? Bloating. ? Constipation. ? Diarrhea. ? Abdominal pain.  You have difficulty with urination, such as inability: ? To control when you urinate (incontinence). ? To completely empty the bladder (retention).  You have palpitations.  You feel dizzy, weak, or faint when you stand up. Get help right away if:  You cannot urinate.  You have sudden weakness or loss of coordination.  You have trouble speaking.  You have pain or pressure in your chest.  You have an irregular heart beat.  You have sudden inability to move a part of your body. Summary  Diabetic neuropathy refers to nerve damage that is caused by diabetes. It can affect nerves throughout the entire body, causing numbness and pain in the arms, legs, digestive tract, heart, and other body systems.  Keep your blood glucose level and your blood pressure in your target range, as directed by your health care provider. This can help prevent neuropathy from getting worse.  Check your skin and feet every day for cuts, bruises, redness, blisters, or sores.  Do not use any products that contain nicotine or tobacco, such as cigarettes and e-cigarettes. If you need help quitting, ask your health care provider. This information is not intended to replace advice given to you by your health care provider. Make sure you discuss any questions you have with your health care provider. Document Revised: 05/29/2017 Document Reviewed: 05/21/2016 Elsevier Patient Education  [employer]. I have increased your dose of mirtazapine to help improve your mood.  If you do not tolerate the 45 mg dose  Let me know and I will refill the lower dose  I did not change your blood pressure medications  .Your diabetes is under excellent control currently. You have lowered your a1c  To  5.9   Please plan to return in 3  months for follow up on your depression   Fasting labs do not need to be done

## 2019-05-05 NOTE — Assessment & Plan Note (Signed)
Stopped supplementation at last visit as she has not tolerated 25 mcg dose. tsh is normal   Lab Results  Component Value Date   TSH 2.08 05/04/2019

## 2019-05-05 NOTE — Assessment & Plan Note (Addendum)
Aggravated by recent death of her sister (not due to covid) who died at home.  Increase remeron dose to 45 mg to improve appetite and energy level.

## 2019-05-05 NOTE — Assessment & Plan Note (Signed)
She has white coat hypertension,  Home readings are at goal, and she has orthostatic symptoms with sudden position change.  No changes today  Lab Results  Component Value Date   CREATININE 0.89 05/04/2019   Lab Results  Component Value Date   NA 141 05/04/2019   K 3.9 05/04/2019   CL 104 05/04/2019   CO2 27 05/04/2019

## 2019-05-05 NOTE — Assessment & Plan Note (Signed)
SS increase in BMD of hip since starting Prolia  Last DEXA May 2019.  Dose given today.

## 2019-05-05 NOTE — Assessment & Plan Note (Signed)
Remains  well-controlled on current medications .  hemoglobin A1c is< 6.0. avoiding metformin due to underweight status no reports of hypoglycemic episodes.  Patient is  Up to date on an annual eye exam and foot exam is up to date and normal .  Patient has early microalbuminuria. Patient is tolerating statin therapy for CAD risk reduction and on ACE/ARB for renal protection and hypertension   Lab Results  Component Value Date   HGBA1C 5.9 (A) 05/04/2019   Lab Results  Component Value Date   MICROALBUR 3.9 (H) 10/14/2017

## 2019-05-24 ENCOUNTER — Other Ambulatory Visit: Payer: Self-pay | Admitting: Internal Medicine

## 2019-05-24 DIAGNOSIS — E039 Hypothyroidism, unspecified: Secondary | ICD-10-CM

## 2019-06-01 ENCOUNTER — Other Ambulatory Visit: Payer: Self-pay | Admitting: Internal Medicine

## 2019-06-01 DIAGNOSIS — E039 Hypothyroidism, unspecified: Secondary | ICD-10-CM

## 2019-06-02 ENCOUNTER — Other Ambulatory Visit: Payer: Self-pay | Admitting: Cardiovascular Disease

## 2019-06-03 NOTE — Telephone Encounter (Signed)
Refill Request.  

## 2019-06-03 NOTE — Telephone Encounter (Signed)
Pt's age 86, wt 53.3 kg, SCr 0.89, CrCl 38.89, last ov w/ MA 04/17/19.

## 2019-06-22 ENCOUNTER — Other Ambulatory Visit: Payer: Self-pay | Admitting: Cardiovascular Disease

## 2019-06-22 ENCOUNTER — Other Ambulatory Visit: Payer: Self-pay | Admitting: Internal Medicine

## 2019-06-22 NOTE — Telephone Encounter (Signed)
Refill request for restoril, last seen 05-04-19, last filled 05-24-19.   Please advise.

## 2019-07-27 ENCOUNTER — Other Ambulatory Visit: Payer: Self-pay

## 2019-07-27 ENCOUNTER — Telehealth: Payer: Self-pay | Admitting: Internal Medicine

## 2019-07-27 MED ORDER — GLIPIZIDE ER 2.5 MG PO TB24: ORAL_TABLET | ORAL | 0 refills | Status: DC

## 2019-07-27 NOTE — Telephone Encounter (Signed)
Pt needs refill on glipizide. She called pharmacy and they do not have further prescriptions for her.

## 2019-07-27 NOTE — Telephone Encounter (Signed)
Refill request for restoril, last seen 05-04-19, last filled 06-23-19.  Please advise.

## 2019-07-29 ENCOUNTER — Other Ambulatory Visit: Payer: Self-pay

## 2019-08-03 ENCOUNTER — Encounter: Payer: Self-pay | Admitting: Internal Medicine

## 2019-08-03 ENCOUNTER — Ambulatory Visit (INDEPENDENT_AMBULATORY_CARE_PROVIDER_SITE_OTHER): Payer: Medicare HMO | Admitting: Internal Medicine

## 2019-08-03 ENCOUNTER — Other Ambulatory Visit: Payer: Self-pay

## 2019-08-03 VITALS — BP 118/80 | HR 98 | Temp 97.9°F | Ht 67.0 in | Wt 115.4 lb

## 2019-08-03 DIAGNOSIS — I872 Venous insufficiency (chronic) (peripheral): Secondary | ICD-10-CM | POA: Diagnosis not present

## 2019-08-03 DIAGNOSIS — E1321 Other specified diabetes mellitus with diabetic nephropathy: Secondary | ICD-10-CM

## 2019-08-03 DIAGNOSIS — F320 Major depressive disorder, single episode, mild: Secondary | ICD-10-CM | POA: Diagnosis not present

## 2019-08-03 DIAGNOSIS — R69 Illness, unspecified: Secondary | ICD-10-CM | POA: Diagnosis not present

## 2019-08-03 DIAGNOSIS — C801 Malignant (primary) neoplasm, unspecified: Secondary | ICD-10-CM | POA: Diagnosis not present

## 2019-08-03 DIAGNOSIS — I482 Chronic atrial fibrillation, unspecified: Secondary | ICD-10-CM | POA: Diagnosis not present

## 2019-08-03 MED ORDER — DICLOFENAC SODIUM 1 % EX GEL: 2.0000 g | Freq: Four times a day (QID) | CUTANEOUS | 5 refills | Status: AC

## 2019-08-03 NOTE — Patient Instructions (Addendum)
I give you permission to have a big bowl of ice cream or a milk shake (with added protein shake)  Every day   Eucerin Cerave Aveeno  Choose 1 moisturizer and use every time you shower    you should elevate legs when at home sitting whenever it is possible.  Consider buying knee highs with support or compression. Most department stores sell them.     I have refilled the voltaren gel for your feet   Return in 3 months

## 2019-08-03 NOTE — Assessment & Plan Note (Addendum)
Remains  well-controlled on current medications .  hemoglobin A1c is   < 6.0. avoiding metformin due to underweight status no reports of hypoglycemic episodes.  Patient is  Up to date on an annual eye exam and foot exam is up to date and normal .  Patient has early microalbuminuria. Patient is tolerating statin therapy for CAD risk reduction and on ACE/ARB for renal protection and hypertension .Marland Kitchen  Diclofenac gel for neuropathy per patient  Helpful.  Refilled,  Lab Results  Component Value Date   HGBA1C 5.9 (A) 05/04/2019   Lab Results  Component Value Date   MICROALBUR 3.9 (H) 10/14/2017

## 2019-08-03 NOTE — Assessment & Plan Note (Addendum)
Aggravated by grief over loss of sister and several in laws.  Not suicidal,  Not pessismistic.  Discussed symptoms and potential plans at length with her and son.   Continue remeron 45 mg daily

## 2019-08-03 NOTE — Assessment & Plan Note (Signed)
Very mild,  Suggested by exam.  Recommend leg elevation and use of compression stockings

## 2019-08-03 NOTE — Progress Notes (Signed)
Subjective:  Patient ID: Cheyenne Torres, female    DOB: 11-07-1931  Age: 86 y.o. MRN: [id number]  CC: The primary encounter diagnosis was Chronic atrial fibrillation ([place]). Diagnoses of Depression, major, single episode, mild ([place]), Other specified diabetes mellitus with diabetic nephropathy, without long-term current use of insulin ([place]), Venous insufficiency of both lower extremities, and Cancer ([place]) were also pertinent to this visit.  HPI Cheyenne Torres presents for 3 month follow up on type 2 DM, hyperlipidemia, depression and underweight.  This visit occurred during the SARS-CoV-2 public health emergency.  Safety protocols were in place, including screening questions prior to the visit, additional usage of staff PPE, and extensive cleaning of exam room while observing appropriate contact time as indicated for disinfecting solutions.    Patient has received both doses of the Ray 19 vaccine without complications.  Patient continues to mask when outside of the home except when walking in yard or at safe distances from others .  Patient denies any change in mood or development of unhealthy behaviors resuting from the pandemic's restriction of activities and socialization.     At last visit in January Cheyenne Torres depression and poor appetite was addressed with increase in mirtazapine dose to 45 mg   Has lost 2 lbs.  Several deaths in short period of time.denies abd pain,  "just can't think of anything I want to eat" (besides ice cream).  Diet and mood discussed     Using diclofenac  Gel on burning feet  Night .    Very mild fluid retention around ankles. Spider veins noted on exam . Denies orthopnea, dyspnea,   Outpatient Medications Prior to Visit  Medication Sig Dispense Refill  . atorvastatin (LIPITOR) 20 MG tablet Take 1 tablet by mouth once daily 90 tablet 0  . Biotin 10000 MCG TABS Take 10,000 mcg by mouth daily.    Marland Kitchen denosumab (PROLIA) 60 MG/ML SOLN injection Inject 60 mg into  the skin every 6 (six) months. Administer in upper arm, thigh, or abdomen 1 mL 0  . ELIQUIS 2.5 MG TABS tablet Take 1 tablet by mouth twice daily 60 tablet 6  . glipiZIDE (GLUCOTROL XL) 2.5 MG 24 hr tablet Take 1 tablet by mouth once daily with breakfast 90 tablet 0  . losartan (COZAAR) 100 MG tablet Take 1 tablet by mouth once daily 90 tablet 0  . Lysine 500 MG TABS Take 500 mg by mouth daily.    . metoprolol tartrate (LOPRESSOR) 50 MG tablet Take 2 tablets by mouth twice daily 120 tablet 4  . mirtazapine (REMERON) 45 MG tablet Take 1 tablet (45 mg total) by mouth at bedtime. 30 tablet 1  . pyridoxine (B-6) 500 MG tablet Take 1 tablet (500 mg total) by mouth daily. 90 tablet 1  . temazepam (RESTORIL) 7.5 MG capsule TAKE 1 CAPSULE BY MOUTH AT BEDTIME AS NEEDED FOR SLEEP 30 capsule 5  . Blood Pressure Monitor KIT Monitor BP once daily 1 each 0  . glucose blood (FREESTYLE LITE) test strip Use once daily to  check blood sugars   250.00  90 day supply 100 each 3   No facility-administered medications prior to visit.    Review of Systems;  Patient denies headache, fevers, malaise, unintentional weight loss, skin rash, eye pain, sinus congestion and sinus pain, sore throat, dysphagia,  hemoptysis , cough, dyspnea, wheezing, chest pain, palpitations, orthopnea, edema, abdominal pain, nausea, melena, diarrhea, constipation, flank pain, dysuria, hematuria, urinary  Frequency, nocturia, numbness,  tingling, seizures,  Focal weakness, Loss of consciousness,  Tremor, , and suicidal ideation.      Objective:  BP 118/80   Pulse 98   Temp 97.9 F (36.6 C) (Temporal)   Ht _0  (1.702 m)   Wt 115 lb 6.4 oz (52.3 kg)   SpO2 99%   BMI 18.07 kg/m   BP Readings from Last 3 Encounters:  08/03/19 118/80  05/04/19 (!) 178/90  04/17/19 102/68    Wt Readings from Last 3 Encounters:  08/03/19 115 lb 6.4 oz (52.3 kg)  05/04/19 117 lb 6.4 oz (53.3 kg)  04/17/19 118 lb (53.5 kg)    General appearance:  alert, cooperative and appears stated age Ears: normal TM's and external ear canals both ears Throat: lips, mucosa, and tongue normal; teeth and gums normal Neck: no adenopathy, no carotid bruit, supple, symmetrical, trachea midline and thyroid not enlarged, symmetric, no tenderness/mass/nodules Back: symmetric, no curvature. ROM normal. No CVA tenderness. Lungs: clear to auscultation bilaterally Heart: regular rate and rhythm, S1, S2 normal, no murmur, click, rub or gallop Abdomen: soft, non-tender; bowel sounds normal; no masses,  no organomegaly Pulses: 2+ and symmetric Skin: Skin color, texture, turgor normal. No rashes or lesions Lymph nodes: Cervical, supraclavicular, and axillary nodes normal.  Lab Results  Component Value Date   HGBA1C 5.9 (A) 05/04/2019   HGBA1C 6.7 (H) 10/24/2018   HGBA1C 6.4 04/07/2018    Lab Results  Component Value Date   CREATININE 0.89 05/04/2019   CREATININE 0.92 10/24/2018   CREATININE 1.05 04/07/2018    Lab Results  Component Value Date   WBC 9.9 05/04/2019   HGB 12.4 05/04/2019   HCT 36.8 05/04/2019   PLT 231.0 05/04/2019   GLUCOSE 124 (H) 05/04/2019   CHOL 131 05/04/2019   TRIG 134.0 05/04/2019   HDL 52.90 05/04/2019   LDLDIRECT 60.0 05/11/2016   LDLCALC 52 05/04/2019   ALT 21 05/04/2019   AST 23 05/04/2019   NA 141 05/04/2019   K 3.9 05/04/2019   CL 104 05/04/2019   CREATININE 0.89 05/04/2019   BUN 23 05/04/2019   CO2 27 05/04/2019   TSH 2.08 05/04/2019   HGBA1C 5.9 (A) 05/04/2019   MICROALBUR 3.9 (H) 10/14/2017    Assessment & Plan:   Problem List Items Addressed This Visit      Unprioritized   Depression, major, single episode, mild ([place])    Aggravated by grief over loss of sister and several in laws.  Not suicidal,  Not pessismistic.  Discussed symptoms and potential plans at length with Cheyenne Torres and son.   Continue remeron 45 mg daily       Diabetes mellitus with diabetic nephropathy ([place])    Remains  well-controlled on  current medications .  hemoglobin A1c is   < 6.0. avoiding metformin due to underweight status no reports of hypoglycemic episodes.  Patient is  Up to date on an annual eye exam and foot exam is up to date and normal .  Patient has early microalbuminuria. Patient is tolerating statin therapy for CAD risk reduction and on ACE/ARB for renal protection and hypertension .Marland Kitchen  Diclofenac gel for neuropathy per patient  Helpful.  Refilled,  Lab Results  Component Value Date   HGBA1C 5.9 (A) 05/04/2019   Lab Results  Component Value Date   MICROALBUR 3.9 (H) 10/14/2017         Venous insufficiency of both lower extremities    Very mild,  Suggested by exam.  Recommend  leg elevation and use of compression stockings      Chronic atrial fibrillation (HCC) - Primary   Cancer ([place])      I provided  30 minutes of  face-to-face time during this encounter reviewing patient's current problems and past surgeries, labs and imaging studies, providing counseling on the above mentioned problems , and coordination  of care . I am having Cheyenne Torres start on diclofenac Sodium. I am also having Cheyenne Torres maintain Cheyenne Torres glucose blood, Blood Pressure Monitor, denosumab, pyridoxine, atorvastatin, mirtazapine, metoprolol tartrate, Eliquis, losartan, temazepam, glipiZIDE, Lysine, and Biotin.  Meds ordered this encounter  Medications  . diclofenac Sodium (VOLTAREN) 1 % GEL    Sig: Apply 2 g topically 4 (four) times daily.    Dispense:  100 g    Refill:  5    There are no discontinued medications.  Follow-up: No follow-ups on file.   Crecencio Mc, MD

## 2019-08-04 ENCOUNTER — Other Ambulatory Visit: Payer: Self-pay | Admitting: Cardiovascular Disease

## 2019-08-18 ENCOUNTER — Telehealth: Payer: Self-pay

## 2019-08-18 NOTE — Telephone Encounter (Signed)
PA for diclofenac gel has been denied by [employer].

## 2019-08-18 NOTE — Telephone Encounter (Signed)
PA for diclofenac gel has been submitted on covermymeds.

## 2019-08-18 NOTE — Telephone Encounter (Signed)
It can be purchased without a prescription otc, if she has been using it and wants to continue it.

## 2019-09-07 ENCOUNTER — Other Ambulatory Visit: Payer: Self-pay | Admitting: Internal Medicine

## 2019-09-21 ENCOUNTER — Other Ambulatory Visit: Payer: Self-pay | Admitting: Internal Medicine

## 2019-09-21 ENCOUNTER — Other Ambulatory Visit: Payer: Self-pay | Admitting: Cardiovascular Disease

## 2019-10-12 ENCOUNTER — Telehealth: Payer: Self-pay | Admitting: Internal Medicine

## 2019-10-12 NOTE — Telephone Encounter (Signed)
Prolia approved and in refrigerator. Scheduled for 11/09/19 during FU appointment.

## 2019-10-27 ENCOUNTER — Encounter: Payer: Self-pay | Admitting: Cardiovascular Disease

## 2019-10-27 ENCOUNTER — Other Ambulatory Visit: Payer: Self-pay

## 2019-10-27 ENCOUNTER — Ambulatory Visit: Payer: Medicare HMO | Admitting: Cardiovascular Disease

## 2019-10-27 VITALS — BP 152/82 | HR 80 | Ht 67.0 in | Wt 113.5 lb

## 2019-10-27 DIAGNOSIS — I482 Chronic atrial fibrillation, unspecified: Secondary | ICD-10-CM | POA: Diagnosis not present

## 2019-10-27 DIAGNOSIS — I10 Essential (primary) hypertension: Secondary | ICD-10-CM

## 2019-10-27 NOTE — Patient Instructions (Signed)
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
## 2019-11-01 ENCOUNTER — Other Ambulatory Visit: Payer: Self-pay | Admitting: Internal Medicine

## 2019-11-07 ENCOUNTER — Other Ambulatory Visit: Payer: Self-pay | Admitting: Cardiovascular Disease

## 2019-11-09 ENCOUNTER — Ambulatory Visit (INDEPENDENT_AMBULATORY_CARE_PROVIDER_SITE_OTHER): Payer: Medicare HMO | Admitting: Internal Medicine

## 2019-11-09 ENCOUNTER — Other Ambulatory Visit: Payer: Self-pay

## 2019-11-09 ENCOUNTER — Ambulatory Visit: Payer: Medicare HMO | Admitting: Internal Medicine

## 2019-11-09 ENCOUNTER — Encounter: Payer: Self-pay | Admitting: Internal Medicine

## 2019-11-09 VITALS — BP 140/82 | HR 99 | Temp 97.5°F | Resp 16 | Ht 67.0 in | Wt 113.6 lb

## 2019-11-09 DIAGNOSIS — D6869 Other thrombophilia: Secondary | ICD-10-CM

## 2019-11-09 DIAGNOSIS — F320 Major depressive disorder, single episode, mild: Secondary | ICD-10-CM

## 2019-11-09 DIAGNOSIS — I482 Chronic atrial fibrillation, unspecified: Secondary | ICD-10-CM | POA: Diagnosis not present

## 2019-11-09 DIAGNOSIS — M810 Age-related osteoporosis without current pathological fracture: Secondary | ICD-10-CM

## 2019-11-09 DIAGNOSIS — E1321 Other specified diabetes mellitus with diabetic nephropathy: Secondary | ICD-10-CM

## 2019-11-09 DIAGNOSIS — I10 Essential (primary) hypertension: Secondary | ICD-10-CM | POA: Diagnosis not present

## 2019-11-09 DIAGNOSIS — R29898 Other symptoms and signs involving the musculoskeletal system: Secondary | ICD-10-CM

## 2019-11-09 DIAGNOSIS — R69 Illness, unspecified: Secondary | ICD-10-CM | POA: Diagnosis not present

## 2019-11-09 DIAGNOSIS — E559 Vitamin D deficiency, unspecified: Secondary | ICD-10-CM | POA: Diagnosis not present

## 2019-11-09 LAB — CBC WITH DIFFERENTIAL/PLATELET
Basophils Absolute: 0 10*3/uL (ref 0.0–0.1)
Basophils Relative: 0.5 % (ref 0.0–3.0)
Eosinophils Absolute: 0 10*3/uL (ref 0.0–0.7)
Eosinophils Relative: 0.7 % (ref 0.0–5.0)
HCT: 36.8 % (ref 36.0–46.0)
Hemoglobin: 12.4 g/dL (ref 12.0–15.0)
Lymphocytes Relative: 33.4 % (ref 12.0–46.0)
Lymphs Abs: 2.2 10*3/uL (ref 0.7–4.0)
MCHC: 33.6 g/dL (ref 30.0–36.0)
MCV: 89.4 fl (ref 78.0–100.0)
Monocytes Absolute: 0.4 10*3/uL (ref 0.1–1.0)
Monocytes Relative: 6.6 % (ref 3.0–12.0)
Neutro Abs: 3.9 10*3/uL (ref 1.4–7.7)
Neutrophils Relative %: 58.8 % (ref 43.0–77.0)
Platelets: 188 10*3/uL (ref 150.0–400.0)
RBC: 4.11 Mil/uL (ref 3.87–5.11)
RDW: 14.5 % (ref 11.5–15.5)
WBC: 6.6 10*3/uL (ref 4.0–10.5)

## 2019-11-09 LAB — COMPREHENSIVE METABOLIC PANEL
ALT: 26 U/L (ref 0–35)
AST: 27 U/L (ref 0–37)
Albumin: 4.4 g/dL (ref 3.5–5.2)
Alkaline Phosphatase: 61 U/L (ref 39–117)
BUN: 21 mg/dL (ref 6–23)
CO2: 31 mEq/L (ref 19–32)
Calcium: 9.6 mg/dL (ref 8.4–10.5)
Chloride: 98 mEq/L (ref 96–112)
Creatinine, Ser: 0.94 mg/dL (ref 0.40–1.20)
GFR: 56.47 mL/min — ABNORMAL LOW (ref >60.00)
Glucose, Bld: 133 mg/dL — ABNORMAL HIGH (ref 70–99)
Potassium: 4 mEq/L (ref 3.5–5.1)
Sodium: 137 mEq/L (ref 135–145)
Total Bilirubin: 0.5 mg/dL (ref 0.2–1.2)
Total Protein: 7 g/dL (ref 6.0–8.3)

## 2019-11-09 LAB — VITAMIN D 25 HYDROXY (VIT D DEFICIENCY, FRACTURES): VITD: 24.24 ng/mL — ABNORMAL LOW (ref 30.00–100.00)

## 2019-11-09 LAB — HEMOGLOBIN A1C: Hgb A1c MFr Bld: 6.1 % (ref 4.6–6.5)

## 2019-11-09 MED ORDER — DENOSUMAB 60 MG/ML ~~LOC~~ SOSY
60.0000 mg | PREFILLED_SYRINGE | Freq: Once | SUBCUTANEOUS | Status: AC
  Administered 2019-11-09: 60 mg via SUBCUTANEOUS

## 2019-11-09 NOTE — Assessment & Plan Note (Signed)
Improved with daily walking at home

## 2019-11-09 NOTE — Assessment & Plan Note (Signed)
No changes today to regimen  Lab Results  Component Value Date   CREATININE 0.94 11/09/2019   Lab Results  Component Value Date   NA 137 11/09/2019   K 4.0 11/09/2019   CL 98 11/09/2019   CO2 31 11/09/2019

## 2019-11-09 NOTE — Progress Notes (Signed)
Subjective:  Patient ID: Cheyenne Torres, female    DOB: 16-Jul-1931  Age: 86 y.o. MRN: [id number]  CC: The primary encounter diagnosis was Age-related osteoporosis without current pathological fracture. Diagnoses of Acquired thrombophilia ([place]), Other specified diabetes mellitus with diabetic nephropathy, without long-term current use of insulin ([place]), Vitamin D deficiency, Proximal leg weakness, Chronic atrial fibrillation ([place]), White coat syndrome with diagnosis of hypertension, and Depression, major, single episode, mild ([place]) were also pertinent to this visit.  HPI Cheyenne Torres presents for 6 month follow up on type 2 DM, hypertension. Chronic atrial  fib and hypothyroidism   T2DM:  She  feels generally well,  But is not  exercising regularly or trying to lose weight. Checking  blood sugars less than once daily at variable times, usually only if she feels she may be having a hypoglycemic event. .  BS have been under 130 fasting and < 150 post prandially.  Denies any recent hypoglyemic events.  Taking   medications as directed. Following a carbohydrate modified diet 6 days per week. Denies numbness, burning and tingling of extremities. Appetite is small  Anxiety:  Feeling anxious about her niece's financial negligence and failure to adapt to life without  her mother, who died over 6 months ago.   BP has been elevated at home   Foot exam done    Outpatient Medications Prior to Visit  Medication Sig Dispense Refill  . atorvastatin (LIPITOR) 20 MG tablet Take 1 tablet by mouth once daily 90 tablet 0  . Biotin 10000 MCG TABS Take 10,000 mcg by mouth daily.    Marland Kitchen denosumab (PROLIA) 60 MG/ML SOLN injection Inject 60 mg into the skin every 6 (six) months. Administer in upper arm, thigh, or abdomen 1 mL 0  . diclofenac Sodium (VOLTAREN) 1 % GEL Apply 2 g topically 4 (four) times daily. 100 g 5  . ELIQUIS 2.5 MG TABS tablet Take 1 tablet by mouth twice daily 60 tablet 6  . glipiZIDE  (GLUCOTROL XL) 2.5 MG 24 hr tablet Take 1 tablet by mouth once daily with breakfast 90 tablet 0  . losartan (COZAAR) 100 MG tablet Take 1 tablet by mouth once daily 90 tablet 0  . Lysine 500 MG TABS Take 500 mg by mouth daily.    . metoprolol tartrate (LOPRESSOR) 50 MG tablet Take 2 tablets by mouth twice daily 120 tablet 5  . mirtazapine (REMERON) 45 MG tablet TAKE 1 TABLET BY MOUTH AT BEDTIME 30 tablet 0  . pyridoxine (B-6) 500 MG tablet Take 1 tablet (500 mg total) by mouth daily. 90 tablet 1  . temazepam (RESTORIL) 7.5 MG capsule TAKE 1 CAPSULE BY MOUTH AT BEDTIME AS NEEDED FOR SLEEP 30 capsule 5  . Blood Pressure Monitor KIT Monitor BP once daily (Patient not taking: Reported on 10/27/2019) 1 each 0  . glucose blood (FREESTYLE LITE) test strip Use once daily to  check blood sugars   250.00  90 day supply (Patient not taking: Reported on 10/27/2019) 100 each 3   No facility-administered medications prior to visit.    Review of Systems;  Patient denies headache, fevers, malaise, unintentional weight loss, skin rash, eye pain, sinus congestion and sinus pain, sore throat, dysphagia,  hemoptysis , cough, dyspnea, wheezing, chest pain, palpitations, orthopnea, edema, abdominal pain, nausea, melena, diarrhea, constipation, flank pain, dysuria, hematuria, urinary  Frequency, nocturia, numbness, tingling, seizures,  Focal weakness, Loss of consciousness,  Tremor, insomnia, depression, anxiety, and suicidal ideation.  Objective:  BP 140/82 (BP Location: Right Arm, Patient Position: Sitting, Cuff Size: Normal)   Pulse 99   Temp (!) 97.5 F (36.4 C) (Oral)   Resp 16   Ht '5\' 7"'  (1.702 m)   Wt 113 lb 9.6 oz (51.5 kg)   SpO2 99%   BMI 17.79 kg/m   BP Readings from Last 3 Encounters:  11/09/19 140/82  10/27/19 (!) 152/82  08/03/19 118/80    Wt Readings from Last 3 Encounters:  11/09/19 113 lb 9.6 oz (51.5 kg)  10/27/19 113 lb 8 oz (51.5 kg)  08/03/19 115 lb 6.4 oz (52.3 kg)     General appearance: alert, cooperative and appears stated age Ears: normal TM's and external ear canals both ears Throat: lips, mucosa, and tongue normal; teeth and gums normal Neck: no adenopathy, no carotid bruit, supple, symmetrical, trachea midline and thyroid not enlarged, symmetric, no tenderness/mass/nodules Back: symmetric, no curvature. ROM normal. No CVA tenderness. Lungs: clear to auscultation bilaterally Heart: regular rate and rhythm, S1, S2 normal, no murmur, click, rub or gallop Abdomen: soft, non-tender; bowel sounds normal; no masses,  no organomegaly Pulses: 2+ and symmetric Skin: Skin color, texture, turgor normal. No rashes or lesions Lymph nodes: Cervical, supraclavicular, and axillary nodes normal.  Lab Results  Component Value Date   HGBA1C 6.1 11/09/2019   HGBA1C 5.9 (A) 05/04/2019   HGBA1C 6.7 (H) 10/24/2018    Lab Results  Component Value Date   CREATININE 0.94 11/09/2019   CREATININE 0.89 05/04/2019   CREATININE 0.92 10/24/2018    Lab Results  Component Value Date   WBC 6.6 11/09/2019   HGB 12.4 11/09/2019   HCT 36.8 11/09/2019   PLT 188.0 11/09/2019   GLUCOSE 133 (H) 11/09/2019   CHOL 131 05/04/2019   TRIG 134.0 05/04/2019   HDL 52.90 05/04/2019   LDLDIRECT 60.0 05/11/2016   LDLCALC 52 05/04/2019   ALT 26 11/09/2019   AST 27 11/09/2019   NA 137 11/09/2019   K 4.0 11/09/2019   CL 98 11/09/2019   CREATININE 0.94 11/09/2019   BUN 21 11/09/2019   CO2 31 11/09/2019   TSH 2.08 05/04/2019   HGBA1C 6.1 11/09/2019   MICROALBUR 3.9 (H) 10/14/2017     Assessment & Plan:   Problem List Items Addressed This Visit      Unprioritized   White coat syndrome with diagnosis of hypertension    No changes today to regimen  Lab Results  Component Value Date   CREATININE 0.94 11/09/2019   Lab Results  Component Value Date   NA 137 11/09/2019   K 4.0 11/09/2019   CL 98 11/09/2019   CO2 31 11/09/2019         Depression, major, single  episode, mild ([place])    complicated by anxiety.  counsellig given       Diabetes mellitus with diabetic nephropathy ([place])    Remains well-controlled on current medications.  hemoglobin A1c has been consistently at or  less than 7.0 . Patient is up-to-date on eye exams and foot exam is unchanged today. Patient  Has had  urine microalbumin to creatinine ratio done within the past year. Patient is tolerating statin therapy for CAD risk reduction and on ACE/ARB for reduction in proteinuria.   Lab Results  Component Value Date   HGBA1C 6.1 11/09/2019         Relevant Orders   Hemoglobin A1c (Completed)   Comprehensive metabolic panel (Completed)   Vitamin D deficiency   Relevant  Orders   VITAMIN D 25 Hydroxy (Vit-D Deficiency, Fractures) (Completed)   Proximal leg weakness    Improved with daily walking at home       Osteoporosis - Primary    Managed with Prolia injections       Chronic atrial fibrillation (HCC)    Rate controlled currently.        Acquired thrombophilia ([place])    Taking Eliquis without side effects       Relevant Orders   CBC with Differential/Platelet (Completed)      I provided  30 minutes of  face-to-face time during this encounter reviewing patient's current problems and past surgeries, labs and imaging studies, providing counseling on the above mentioned problems , and coordination  of care .  I have discontinued Raynelle Bring. Derwin's glucose blood and Blood Pressure Monitor. I am also having her maintain her denosumab, pyridoxine, Eliquis, temazepam, glipiZIDE, Lysine, Biotin, diclofenac Sodium, atorvastatin, losartan, mirtazapine, and metoprolol tartrate. We administered denosumab.  Meds ordered this encounter  Medications  . denosumab (PROLIA) injection 60 mg    Medications Discontinued During This Encounter  Medication Reason  . glucose blood (FREESTYLE LITE) test strip Error  . Blood Pressure Monitor KIT Error    Follow-up: No follow-ups on  file.   Crecencio Mc, MD

## 2019-11-09 NOTE — Progress Notes (Signed)
Patient presented for 6-month Prolia injection SQ to right arm. Patient tolerated well. 

## 2019-11-09 NOTE — Assessment & Plan Note (Signed)
complicated by anxiety.  counsellig given

## 2019-11-09 NOTE — Assessment & Plan Note (Signed)
Taking Eliquis without side effects

## 2019-11-09 NOTE — Assessment & Plan Note (Signed)
Remains well-controlled on current medications.  hemoglobin A1c has been consistently at or  less than 7.0 . Patient is up-to-date on eye exams and foot exam is unchanged today. Patient  Has had  urine microalbumin to creatinine ratio done within the past year. Patient is tolerating statin therapy for CAD risk reduction and on ACE/ARB for reduction in proteinuria.   Lab Results  Component Value Date   HGBA1C 6.1 11/09/2019

## 2019-11-09 NOTE — Assessment & Plan Note (Signed)
Managed with Prolia injections

## 2019-11-09 NOTE — Patient Instructions (Addendum)
Check your blood sugar 2 hours after you eat your canteloupe (and Izell [place] after he eats his  Watermelon).  If the blood sugar reading is < 160,   Ok to eat this fruit daily .  If it's higher,  We can adjust your medication    Check your blood pressure once a week.  If your blood pressure readings at home are 140/80 or less,  No adjustments are needed   Stop worrying about your niece.  It is not your responsibility to keep her happy or financially cared for

## 2019-11-09 NOTE — Assessment & Plan Note (Signed)
Rate controlled currently  

## 2019-11-28 ENCOUNTER — Other Ambulatory Visit: Payer: Self-pay | Admitting: Cardiovascular Disease

## 2019-11-28 ENCOUNTER — Other Ambulatory Visit: Payer: Self-pay | Admitting: Internal Medicine

## 2019-12-21 ENCOUNTER — Other Ambulatory Visit: Payer: Self-pay | Admitting: Internal Medicine

## 2019-12-21 ENCOUNTER — Other Ambulatory Visit: Payer: Self-pay | Admitting: Cardiovascular Disease

## 2019-12-21 NOTE — Telephone Encounter (Signed)
Refill request for Restoril and remeron, last seen 11-09-19.  Please advise.

## 2020-02-01 ENCOUNTER — Other Ambulatory Visit: Payer: Self-pay | Admitting: Internal Medicine

## 2020-02-02 ENCOUNTER — Other Ambulatory Visit: Payer: Self-pay | Admitting: Cardiovascular Disease

## 2020-02-02 NOTE — Telephone Encounter (Signed)
Refill request

## 2020-02-16 DIAGNOSIS — R69 Illness, unspecified: Secondary | ICD-10-CM | POA: Diagnosis not present

## 2020-03-07 ENCOUNTER — Other Ambulatory Visit: Payer: Self-pay | Admitting: Cardiovascular Disease

## 2020-03-11 DIAGNOSIS — S0083XA Contusion of other part of head, initial encounter: Secondary | ICD-10-CM | POA: Diagnosis not present

## 2020-03-11 DIAGNOSIS — S0003XA Contusion of scalp, initial encounter: Secondary | ICD-10-CM | POA: Diagnosis not present

## 2020-03-11 DIAGNOSIS — E119 Type 2 diabetes mellitus without complications: Secondary | ICD-10-CM | POA: Diagnosis not present

## 2020-03-11 DIAGNOSIS — I6782 Cerebral ischemia: Secondary | ICD-10-CM | POA: Diagnosis not present

## 2020-03-11 DIAGNOSIS — W01190A Fall on same level from slipping, tripping and stumbling with subsequent striking against furniture, initial encounter: Secondary | ICD-10-CM | POA: Diagnosis not present

## 2020-03-11 DIAGNOSIS — S06370A Contusion, laceration, and hemorrhage of cerebellum without loss of consciousness, initial encounter: Secondary | ICD-10-CM | POA: Diagnosis not present

## 2020-03-11 DIAGNOSIS — S199XXA Unspecified injury of neck, initial encounter: Secondary | ICD-10-CM | POA: Diagnosis not present

## 2020-03-11 DIAGNOSIS — R220 Localized swelling, mass and lump, head: Secondary | ICD-10-CM | POA: Diagnosis not present

## 2020-03-11 DIAGNOSIS — I4891 Unspecified atrial fibrillation: Secondary | ICD-10-CM | POA: Diagnosis not present

## 2020-03-11 DIAGNOSIS — G319 Degenerative disease of nervous system, unspecified: Secondary | ICD-10-CM | POA: Diagnosis not present

## 2020-03-11 DIAGNOSIS — E039 Hypothyroidism, unspecified: Secondary | ICD-10-CM | POA: Diagnosis not present

## 2020-03-11 DIAGNOSIS — I10 Essential (primary) hypertension: Secondary | ICD-10-CM | POA: Diagnosis not present

## 2020-03-11 DIAGNOSIS — S06330A Contusion and laceration of cerebrum, unspecified, without loss of consciousness, initial encounter: Secondary | ICD-10-CM | POA: Diagnosis not present

## 2020-03-11 DIAGNOSIS — Z7901 Long term (current) use of anticoagulants: Secondary | ICD-10-CM | POA: Diagnosis not present

## 2020-03-11 DIAGNOSIS — Z79899 Other long term (current) drug therapy: Secondary | ICD-10-CM | POA: Diagnosis not present

## 2020-03-11 DIAGNOSIS — M47812 Spondylosis without myelopathy or radiculopathy, cervical region: Secondary | ICD-10-CM | POA: Diagnosis not present

## 2020-03-11 DIAGNOSIS — M4312 Spondylolisthesis, cervical region: Secondary | ICD-10-CM | POA: Diagnosis not present

## 2020-03-14 ENCOUNTER — Telehealth: Payer: Self-pay | Admitting: Internal Medicine

## 2020-03-14 ENCOUNTER — Other Ambulatory Visit: Payer: Self-pay

## 2020-03-14 ENCOUNTER — Encounter: Payer: Self-pay | Admitting: Internal Medicine

## 2020-03-14 ENCOUNTER — Ambulatory Visit (INDEPENDENT_AMBULATORY_CARE_PROVIDER_SITE_OTHER): Payer: Medicare HMO | Admitting: Internal Medicine

## 2020-03-14 VITALS — BP 130/94 | HR 85 | Temp 97.4°F | Resp 15 | Ht 67.0 in | Wt 111.8 lb

## 2020-03-14 DIAGNOSIS — R29898 Other symptoms and signs involving the musculoskeletal system: Secondary | ICD-10-CM | POA: Diagnosis not present

## 2020-03-14 DIAGNOSIS — E1121 Type 2 diabetes mellitus with diabetic nephropathy: Secondary | ICD-10-CM

## 2020-03-14 DIAGNOSIS — S0990XD Unspecified injury of head, subsequent encounter: Secondary | ICD-10-CM | POA: Diagnosis not present

## 2020-03-14 DIAGNOSIS — D518 Other vitamin B12 deficiency anemias: Secondary | ICD-10-CM

## 2020-03-14 DIAGNOSIS — I614 Nontraumatic intracerebral hemorrhage in cerebellum: Secondary | ICD-10-CM

## 2020-03-14 DIAGNOSIS — E039 Hypothyroidism, unspecified: Secondary | ICD-10-CM | POA: Diagnosis not present

## 2020-03-14 DIAGNOSIS — D6869 Other thrombophilia: Secondary | ICD-10-CM

## 2020-03-14 NOTE — Patient Instructions (Signed)
I hope you will reconsider home Physical therapy to help strengthen your legs   You were very lucky.  ANY FALL should be reported to your son.  And the Eliquis should be STOPPED

## 2020-03-14 NOTE — Telephone Encounter (Signed)
Pt had a fall last week and was seen at [facility] for a Cerebellar contusion. Pt son said that they told her that she needed to follow up with Dr.Tullo this week they also wanted to discuss her memory loss

## 2020-03-14 NOTE — Progress Notes (Signed)
Subjective:  Patient ID: Cheyenne Torres, female    DOB: 12/15/1931  Age: 86 y.o. MRN: [id number]  CC: The primary encounter diagnosis was Other vitamin B12 deficiency anemia. Diagnoses of Acquired thrombophilia ([place]), Acquired hypothyroidism, Type 2 diabetes mellitus with diabetic nephropathy, without long-term current use of insulin ([place]), Cerebellar bleed ([place]), Closed head injury without loss of consciousness, subsequent encounter, and Proximal leg weakness were also pertinent to this visit.  HPI Cheyenne Torres presents for follow up ON A RECENT FALL. She is accompanied by her son.   This visit occurred during the SARS-CoV-2 public health emergency.  Safety protocols were in place, including screening questions prior to the visit, additional usage of staff PPE, and extensive cleaning of exam room while observing appropriate contact time as indicated for disinfecting solutions.    Patient has received both doses of the available COVID 19 vaccine without complications.  Patient continues to mask when outside of the home except when walking in yard or at safe distances from others .  Patient denies any change in mood or development of unhealthy behaviors resuting from the pandemic's restriction of activities and socialization.    Patient had a fall at home on [place] evening  Nov 12.   She did not slip or trip.  Her legs just gave out and she struck her face on wooden dresser in her bedroom.  She developed a large lump on the right side of her scalp, above her forehead,  And a small gash to her right cheek that bled profusely. Neither she nor her husband called her son , so he was unaware until he saw her on wednesday.  Face was bruised and swollen,  But she was mentating well and denied being in pain. She refused to be taken to ER.  She finally went to urgent care on Friday because of the amount of bruising noted to her face.  UC sent to [place] for evaluation.  Head and  maxillofacial CTs done,  ER physician noted a small left sided cerebellar bleed on head CT , chronicity uncertain. Marland Kitchen   No facial bones were fractured. Eliquis was not stopped. She was not admitted for observation since the injury occurred 4 days prior . Son notes that the ER nurse  Presumed that patient's repeated questions were a sign of dementia; but patient was actually quite frustrated as she waited 6 hours to see the physician.  Son has no notice of any memory loss or impaired thinking.  Patient admits that she has not been walking as much lately but denies pain and refuses PT   Acquired thrombophilia:  She is taking Eliquis for mitigation of embolic stroke risk due to chronic atrial fib .  This is her first fall    Outpatient Medications Prior to Visit  Medication Sig Dispense Refill  . atorvastatin (LIPITOR) 20 MG tablet Take 1 tablet by mouth once daily 90 tablet 1  . Biotin 10000 MCG TABS Take 10,000 mcg by mouth daily.    Marland Kitchen denosumab (PROLIA) 60 MG/ML SOLN injection Inject 60 mg into the skin every 6 (six) months. Administer in upper arm, thigh, or abdomen 1 mL 0  . diclofenac Sodium (VOLTAREN) 1 % GEL Apply 2 g topically 4 (four) times daily. 100 g 5  . ELIQUIS 2.5 MG TABS tablet Take 1 tablet by mouth twice daily 180 tablet 1  . glipiZIDE (GLUCOTROL XL) 2.5 MG 24 hr tablet Take 1 tablet  by mouth once daily with breakfast 90 tablet 0  . losartan (COZAAR) 100 MG tablet Take 1 tablet by mouth once daily 90 tablet 0  . Lysine 500 MG TABS Take 500 mg by mouth daily.    . metoprolol tartrate (LOPRESSOR) 50 MG tablet Take 2 tablets by mouth twice daily 120 tablet 5  . mirtazapine (REMERON) 45 MG tablet TAKE 1 TABLET BY MOUTH AT BEDTIME 30 tablet 5  . pyridoxine (B-6) 500 MG tablet Take 1 tablet (500 mg total) by mouth daily. 90 tablet 1  . temazepam (RESTORIL) 7.5 MG capsule TAKE 1 CAPSULE BY MOUTH AT BEDTIME AS NEEDED FOR SLEEP 30 capsule 5   No facility-administered medications prior to  visit.    Review of Systems;  Patient denies headache, fevers, malaise, unintentional weight loss, skin rash, eye pain, sinus congestion and sinus pain, sore throat, dysphagia,  hemoptysis , cough, dyspnea, wheezing, chest pain, palpitations, orthopnea, edema, abdominal pain, nausea, melena, diarrhea, constipation, flank pain, dysuria, hematuria, urinary  Frequency, nocturia, numbness, tingling, seizures,  Focal weakness, Loss of consciousness,  Tremor, insomnia, depression, anxiety, and suicidal ideation.      Objective:  BP (!) 130/94 (BP Location: Left Arm, Patient Position: Sitting, Cuff Size: Normal)   Pulse 85   Temp (!) 97.4 F (36.3 C) (Oral)   Resp 15   Ht 5\' 7"  (1.702 m)   Wt 111 lb 12.8 oz (50.7 kg)   SpO2 98%   BMI 17.51 kg/m   BP Readings from Last 3 Encounters:  03/14/20 (!) 130/94  11/09/19 140/82  10/27/19 (!) 152/82    Wt Readings from Last 3 Encounters:  03/14/20 111 lb 12.8 oz (50.7 kg)  11/09/19 113 lb 9.6 oz (51.5 kg)  10/27/19 113 lb 8 oz (51.5 kg)    General appearance: alert, cooperative and appears stated age Ears: normal TM's and external ear canals both ears Throat: lips, mucosa, and tongue normal; teeth and gums normal Face:  Large left sided hematoma above forehead,  extensive ecchymosis of left side of face.  Small healing gash to left cheek  Neck: no adenopathy, no carotid bruit, supple, symmetrical, trachea midline and thyroid not enlarged, symmetric, no tenderness/mass/nodules Back: symmetric, no curvature. ROM normal. No CVA tenderness. Lungs: clear to auscultation bilaterally Heart: regular rate and rhythm, S1, S2 normal, no murmur, click, rub or gallop Abdomen: soft, non-tender; bowel sounds normal; no masses,  no organomegaly Pulses: 2+ and symmetric Skin: Skin color, texture, turgor normal. No rashes or lesions Lymph nodes: Cervical, supraclavicular, and axillary nodes normal. Neuro: CNs 2-12 intact. DTRs 2+/4 in biceps,  brachioradialis, patellars and achilles. Muscle strength 5/5 in upper and lower exremities. Fine resting tremor bilaterally both hands cerebellar function normal. Romberg negative.  No pronator drift.   Gait normal.    Lab Results  Component Value Date   HGBA1C 6.1 11/09/2019   HGBA1C 5.9 (A) 05/04/2019   HGBA1C 6.7 (H) 10/24/2018    Lab Results  Component Value Date   CREATININE 0.94 11/09/2019   CREATININE 0.89 05/04/2019   CREATININE 0.92 10/24/2018    Lab Results  Component Value Date   WBC 6.6 11/09/2019   HGB 12.4 11/09/2019   HCT 36.8 11/09/2019   PLT 188.0 11/09/2019   GLUCOSE 133 (H) 11/09/2019   CHOL 131 05/04/2019   TRIG 134.0 05/04/2019   HDL 52.90 05/04/2019   LDLDIRECT 60.0 05/11/2016   LDLCALC 52 05/04/2019   ALT 26 11/09/2019   AST 27 11/09/2019  NA 137 11/09/2019   K 4.0 11/09/2019   CL 98 11/09/2019   CREATININE 0.94 11/09/2019   BUN 21 11/09/2019   CO2 31 11/09/2019   TSH 2.08 05/04/2019   HGBA1C 6.1 11/09/2019   MICROALBUR 3.9 (H) 10/14/2017    Assessment & Plan:   Problem List Items Addressed This Visit      Unprioritized   Acquired thrombophilia ([place])    Discussed the risk of a future SDH  Vs the risk of an embolic CVA. She has agreed to stop the Eliquis if she has even a minor fall in the future       B12 deficiency anemia - Primary   Relevant Orders   CBC with Differential/Platelet   Vitamin B12   Cerebellar bleed ([place])    Chronology unclear  regarding the timing in relation to the fall (causative vs consequence).  Given the absence of an occipital bruise on either side,  The small bleed noted on the head CT on the left side  may have been the cause of the fall       Diabetes mellitus with diabetic nephropathy ([place])   Relevant Orders   Hemoglobin A1c   Comprehensive metabolic panel   Head injury, closed, without LOC    Occurring while anticoagulated with Eliquis on Nov 12. She has a large goose egg on the left side of her crown,   With extensive ecchymosis covering the left side of her face.  Head CT noted a small cerebellar bleed of uncertain chronicity,  And no fractures. She has had no change in cognitive function.       Hypothyroid   Proximal leg weakness    She has refused PT.  Advised to increase walking and exercising at home.         I provided  40 minutes of  face-to-face time during this encounter reviewing patient's current problems and past surgeries, labs and imaging studies, providing counseling on the above mentioned problems , and coordination  of care .  I am having Cheyenne Torres maintain her denosumab, pyridoxine, Lysine, Biotin, diclofenac Sodium, metoprolol tartrate, atorvastatin, temazepam, mirtazapine, glipiZIDE, Eliquis, and losartan.  No orders of the defined types were placed in this encounter.   There are no discontinued medications.  Follow-up: No follow-ups on file.   Crecencio Mc, MD

## 2020-03-14 NOTE — Telephone Encounter (Signed)
Spoke with pt's son and scheduled pt for an appt this afternoon at 3:30pm.

## 2020-03-15 DIAGNOSIS — I614 Nontraumatic intracerebral hemorrhage in cerebellum: Secondary | ICD-10-CM | POA: Insufficient documentation

## 2020-03-15 LAB — CBC WITH DIFFERENTIAL/PLATELET
Basophils Absolute: 0.1 10*3/uL (ref 0.0–0.1)
Basophils Relative: 0.8 % (ref 0.0–3.0)
Eosinophils Absolute: 0.1 10*3/uL (ref 0.0–0.7)
Eosinophils Relative: 1.5 % (ref 0.0–5.0)
HCT: 38.5 % (ref 36.0–46.0)
Hemoglobin: 12.8 g/dL (ref 12.0–15.0)
Lymphocytes Relative: 37.8 % (ref 12.0–46.0)
Lymphs Abs: 3.2 10*3/uL (ref 0.7–4.0)
MCHC: 33.4 g/dL (ref 30.0–36.0)
MCV: 87.8 fl (ref 78.0–100.0)
Monocytes Absolute: 0.6 10*3/uL (ref 0.1–1.0)
Monocytes Relative: 7.1 % (ref 3.0–12.0)
Neutro Abs: 4.5 10*3/uL (ref 1.4–7.7)
Neutrophils Relative %: 52.8 % (ref 43.0–77.0)
Platelets: 242 10*3/uL (ref 150.0–400.0)
RBC: 4.38 Mil/uL (ref 3.87–5.11)
RDW: 14.7 % (ref 11.5–15.5)
WBC: 8.5 10*3/uL (ref 4.0–10.5)

## 2020-03-15 LAB — COMPREHENSIVE METABOLIC PANEL
ALT: 21 U/L (ref 0–35)
AST: 26 U/L (ref 0–37)
Albumin: 4.7 g/dL (ref 3.5–5.2)
Alkaline Phosphatase: 63 U/L (ref 39–117)
BUN: 23 mg/dL (ref 6–23)
CO2: 28 mEq/L (ref 19–32)
Calcium: 9.7 mg/dL (ref 8.4–10.5)
Chloride: 102 mEq/L (ref 96–112)
Creatinine, Ser: 0.85 mg/dL (ref 0.40–1.20)
GFR: 62.1 mL/min (ref >60.00)
Glucose, Bld: 117 mg/dL — ABNORMAL HIGH (ref 70–99)
Potassium: 5.1 mEq/L (ref 3.5–5.1)
Sodium: 138 mEq/L (ref 135–145)
Total Bilirubin: 0.7 mg/dL (ref 0.2–1.2)
Total Protein: 8.1 g/dL (ref 6.0–8.3)

## 2020-03-15 LAB — VITAMIN B12: Vitamin B-12: 216 pg/mL (ref 211–911)

## 2020-03-15 LAB — HEMOGLOBIN A1C: Hgb A1c MFr Bld: 6.3 % (ref 4.6–6.5)

## 2020-03-15 NOTE — Assessment & Plan Note (Addendum)
Occurring while anticoagulated with Eliquis on Nov 12. She has a large goose egg on the left side of her crown,  With extensive ecchymosis covering the left side of her face.  Head CT noted a small cerebellar bleed of uncertain chronicity,  And no fractures. She has had no change in cognitive function.

## 2020-03-15 NOTE — Assessment & Plan Note (Signed)
Discussed the risk of a future SDH  Vs the risk of an embolic CVA. She has agreed to stop the Eliquis if she has even a minor fall in the future

## 2020-03-15 NOTE — Assessment & Plan Note (Signed)
Chronology unclear  regarding the timing in relation to the fall (causative vs consequence).  Given the absence of an occipital bruise on either side,  The small bleed noted on the head CT on the left side  may have been the cause of the fall

## 2020-03-15 NOTE — Assessment & Plan Note (Signed)
She has refused PT.  Advised to increase walking and exercising at home.

## 2020-03-18 ENCOUNTER — Telehealth: Payer: Self-pay | Admitting: Internal Medicine

## 2020-03-18 NOTE — Telephone Encounter (Signed)
Pt son called and wanted to know if something could be called in for his mother she is having a sore throat or what would be the best over the counter medication to take

## 2020-03-18 NOTE — Telephone Encounter (Signed)
OTC meds for sore throat:  cepacol losenges,  gargle with salt water,  Benadryl if post nasal drip is playing a role

## 2020-03-18 NOTE — Telephone Encounter (Signed)
Will triage please

## 2020-03-18 NOTE — Telephone Encounter (Signed)
Called and spoke to [place]. Sherrice verbalized understanding and had no further questions.

## 2020-03-18 NOTE — Telephone Encounter (Signed)
Called and spoke to [place]. Teren states that she only had a sore throat during the night of 03/17/2020 and today she is experiencing a slight nasal drainage. She states that she commonly has sinus issues and her nasal drip started today on 03/18/2020. She is not experiencing any other symptoms.

## 2020-03-20 ENCOUNTER — Other Ambulatory Visit: Payer: Self-pay | Admitting: Internal Medicine

## 2020-03-20 DIAGNOSIS — D519 Vitamin B12 deficiency anemia, unspecified: Secondary | ICD-10-CM

## 2020-03-20 NOTE — Assessment & Plan Note (Signed)
Intrinsic factor negative in 2018.  Resume weekly injections and recheck folate,  IF ab.

## 2020-03-20 NOTE — Progress Notes (Signed)
We may have found a contributor to her recent fall. She is b12 deficient.  She should  return for a WEEKLY IM injection for 3 weeks . Additional labs need to be done on first visit. Depending on the the results we may be able to change to oral supplements.  If not, a family member r her pharmacist can  administer monthly shots if they are  comfortable  doing so.    Regards,   Deborra Medina, MD

## 2020-03-21 ENCOUNTER — Other Ambulatory Visit: Payer: Self-pay

## 2020-03-21 ENCOUNTER — Ambulatory Visit (INDEPENDENT_AMBULATORY_CARE_PROVIDER_SITE_OTHER): Payer: Medicare HMO

## 2020-03-21 DIAGNOSIS — D519 Vitamin B12 deficiency anemia, unspecified: Secondary | ICD-10-CM | POA: Diagnosis not present

## 2020-03-21 MED ORDER — CYANOCOBALAMIN 1000 MCG/ML IJ SOLN
1000.0000 ug | Freq: Once | INTRAMUSCULAR | Status: AC
  Administered 2020-03-21: 1000 ug via INTRAMUSCULAR

## 2020-03-21 NOTE — Progress Notes (Signed)
Patient presented for B 12 injection to right deltoid, patient voiced no concerns nor showed any signs of distress during injection. 

## 2020-03-22 LAB — CBC WITH DIFFERENTIAL/PLATELET
Basophils Absolute: 0.1 10*3/uL (ref 0.0–0.1)
Basophils Relative: 0.8 % (ref 0.0–3.0)
Eosinophils Absolute: 0.1 10*3/uL (ref 0.0–0.7)
Eosinophils Relative: 1.9 % (ref 0.0–5.0)
HCT: 36 % (ref 36.0–46.0)
Hemoglobin: 12.1 g/dL (ref 12.0–15.0)
Lymphocytes Relative: 31.7 % (ref 12.0–46.0)
Lymphs Abs: 2.3 10*3/uL (ref 0.7–4.0)
MCHC: 33.5 g/dL (ref 30.0–36.0)
MCV: 87.7 fl (ref 78.0–100.0)
Monocytes Absolute: 0.5 10*3/uL (ref 0.1–1.0)
Monocytes Relative: 7.1 % (ref 3.0–12.0)
Neutro Abs: 4.3 10*3/uL (ref 1.4–7.7)
Neutrophils Relative %: 58.5 % (ref 43.0–77.0)
Platelets: 193 10*3/uL (ref 150.0–400.0)
RBC: 4.1 Mil/uL (ref 3.87–5.11)
RDW: 15.1 % (ref 11.5–15.5)
WBC: 7.4 10*3/uL (ref 4.0–10.5)

## 2020-03-23 LAB — INTRINSIC FACTOR ANTIBODIES: Intrinsic Factor: POSITIVE — AB

## 2020-03-23 LAB — FOLATE RBC: RBC Folate: 717 ng/mL RBC (ref >280)

## 2020-03-24 NOTE — Progress Notes (Signed)
  Azaya's intrinsic factor antibody was positive, which means that  she has an antibody to  the receptor that allows the b12 to be absorbed in the stomach,  so she cannot absorb the b12 very well from your stomach.  So that means she will need to continue supplementing  with monthly injections (after 4 weeks of weekly injections)    or use daily sublingual liquid or tabs that dissolve under the tongue, Not switch to oral tablets. .  If you would like to learn how to give the injections  to yourself,   My nurse  can help you become comfortable doing this on your subsequent visits.  Regards,   Deborra Medina, MD

## 2020-03-28 ENCOUNTER — Other Ambulatory Visit: Payer: Self-pay

## 2020-03-28 ENCOUNTER — Ambulatory Visit (INDEPENDENT_AMBULATORY_CARE_PROVIDER_SITE_OTHER): Payer: Medicare HMO

## 2020-03-28 DIAGNOSIS — D518 Other vitamin B12 deficiency anemias: Secondary | ICD-10-CM | POA: Diagnosis not present

## 2020-03-28 MED ORDER — CYANOCOBALAMIN 1000 MCG/ML IJ SOLN
1000.0000 ug | Freq: Once | INTRAMUSCULAR | Status: AC
  Administered 2020-03-28: 1000 ug via INTRAMUSCULAR

## 2020-03-28 NOTE — Progress Notes (Signed)
Patient presented for B 12 injection to right deltoid, patient voiced no concerns nor showed any signs of distress during injection. 

## 2020-04-04 ENCOUNTER — Ambulatory Visit (INDEPENDENT_AMBULATORY_CARE_PROVIDER_SITE_OTHER): Payer: Medicare HMO

## 2020-04-04 ENCOUNTER — Other Ambulatory Visit: Payer: Self-pay

## 2020-04-04 DIAGNOSIS — D518 Other vitamin B12 deficiency anemias: Secondary | ICD-10-CM | POA: Diagnosis not present

## 2020-04-04 MED ORDER — CYANOCOBALAMIN 1000 MCG/ML IJ SOLN
1000.0000 ug | Freq: Once | INTRAMUSCULAR | Status: AC
  Administered 2020-04-04: 1000 ug via INTRAMUSCULAR

## 2020-04-04 NOTE — Progress Notes (Signed)
Patient presented for B 12 injection to right deltoid, patient voiced no concerns nor showed any signs of distress during injection. 

## 2020-04-11 ENCOUNTER — Other Ambulatory Visit: Payer: Self-pay

## 2020-04-11 ENCOUNTER — Ambulatory Visit (INDEPENDENT_AMBULATORY_CARE_PROVIDER_SITE_OTHER): Payer: Medicare HMO

## 2020-04-11 DIAGNOSIS — D518 Other vitamin B12 deficiency anemias: Secondary | ICD-10-CM | POA: Diagnosis not present

## 2020-04-11 MED ORDER — CYANOCOBALAMIN 1000 MCG/ML IJ SOLN
1000.0000 ug | Freq: Once | INTRAMUSCULAR | Status: AC
  Administered 2020-04-11: 1000 ug via INTRAMUSCULAR

## 2020-04-11 NOTE — Progress Notes (Signed)
Patient presented for B 12 injection to right deltoid, patient voiced no concerns nor showed any signs of distress during injection. 

## 2020-05-09 ENCOUNTER — Telehealth: Payer: Self-pay | Admitting: Internal Medicine

## 2020-05-09 NOTE — Telephone Encounter (Signed)
Patient Prolia is in office , but patient insurance has comeback twice denied to policy canceled so I call ed and received new ID number from patient and resubmitted insurance information to [place] awaiting their authorization , also contacted patient DPR and ask that insurance cars be upload to [facility] chart to make correct information given.

## 2020-05-09 NOTE — Telephone Encounter (Signed)
Authorization is not needed patient medication has been approved.

## 2020-05-13 ENCOUNTER — Other Ambulatory Visit: Payer: Self-pay

## 2020-05-13 ENCOUNTER — Encounter: Payer: Self-pay | Admitting: Internal Medicine

## 2020-05-13 ENCOUNTER — Ambulatory Visit (INDEPENDENT_AMBULATORY_CARE_PROVIDER_SITE_OTHER): Payer: Medicare HMO | Admitting: Internal Medicine

## 2020-05-13 DIAGNOSIS — D6869 Other thrombophilia: Secondary | ICD-10-CM

## 2020-05-13 DIAGNOSIS — E1321 Other specified diabetes mellitus with diabetic nephropathy: Secondary | ICD-10-CM | POA: Diagnosis not present

## 2020-05-13 DIAGNOSIS — R29898 Other symptoms and signs involving the musculoskeletal system: Secondary | ICD-10-CM | POA: Diagnosis not present

## 2020-05-13 DIAGNOSIS — F320 Major depressive disorder, single episode, mild: Secondary | ICD-10-CM

## 2020-05-13 DIAGNOSIS — R634 Abnormal weight loss: Secondary | ICD-10-CM

## 2020-05-13 DIAGNOSIS — D518 Other vitamin B12 deficiency anemias: Secondary | ICD-10-CM | POA: Diagnosis not present

## 2020-05-13 DIAGNOSIS — S0990XD Unspecified injury of head, subsequent encounter: Secondary | ICD-10-CM | POA: Diagnosis not present

## 2020-05-13 DIAGNOSIS — R69 Illness, unspecified: Secondary | ICD-10-CM | POA: Diagnosis not present

## 2020-05-13 NOTE — Patient Instructions (Addendum)
You have to keep moving!      You are cold because you don't generate any heat when you are sitting still  You have hip  pain from arthritis.  This is complicated by weak muscles !  Treat your pain :    You can take  up to 2000 mg of acetominophen (tylenol) every day safely  In divided doses (500 mg every 6 hours  Or 1000 mg every 12 hours.)   Please schedule a  Lab visit in late February.  We will give you your Prolia injection then,

## 2020-05-13 NOTE — Progress Notes (Signed)
Subjective:  Patient ID: Cheyenne Torres, female    DOB: Sep 05, 1931  Age: 87 y.o. MRN: [id number]  CC: Diagnoses of Other vitamin B12 deficiency anemia, Other specified diabetes mellitus with diabetic nephropathy, without long-term current use of insulin ([place]), Unintentional weight loss, Proximal leg weakness, Depression, major, single episode, mild ([place]), Closed head injury without loss of consciousness, subsequent encounter, and Acquired thrombophilia ([place]) were pertinent to this visit.  HPI Cheyenne Torres presents for FOLLOW UP ON MULTIPLE ISSUES INCLUDING B12 DEFICIENCY AND FREQUENT FALLS   This visit occurred during the SARS-CoV-2 public health emergency.  Safety protocols were in place, including screening questions prior to the visit, additional usage of staff PPE, and extensive cleaning of exam room while observing appropriate contact time as indicated for disinfecting solutions.   B12 DEFICIENT . Received 4 weekly injections,  Positive intrinsic factor ab noted. .  She states she has seen  no improvement in her energy  Level since starting the injections.  Her son notes that when her husband had pneumonia in late November she became motivated and efficient in caring for him ,  But since he has recovered she has resumed her sedentary lifestyle. She is not walking daily .  She complains of feeling cold  Constantly .  She has become very inactive.     Outpatient Medications Prior to Visit  Medication Sig Dispense Refill  . atorvastatin (LIPITOR) 20 MG tablet Take 1 tablet by mouth once daily 90 tablet 1  . Biotin 10000 MCG TABS Take 10,000 mcg by mouth daily.    Marland Kitchen denosumab (PROLIA) 60 MG/ML SOLN injection Inject 60 mg into the skin every 6 (six) months. Administer in upper arm, thigh, or abdomen 1 mL 0  . diclofenac Sodium (VOLTAREN) 1 % GEL Apply 2 g topically 4 (four) times daily. 100 g 5  . glipiZIDE (GLUCOTROL XL) 2.5 MG 24 hr tablet Take 1 tablet by mouth once daily with breakfast  90 tablet 0  . losartan (COZAAR) 100 MG tablet Take 1 tablet by mouth once daily 90 tablet 0  . Lysine 500 MG TABS Take 500 mg by mouth daily.    . metoprolol tartrate (LOPRESSOR) 50 MG tablet Take 2 tablets by mouth twice daily 120 tablet 5  . mirtazapine (REMERON) 45 MG tablet TAKE 1 TABLET BY MOUTH AT BEDTIME 30 tablet 5  . pyridoxine (B-6) 500 MG tablet Take 1 tablet (500 mg total) by mouth daily. 90 tablet 1  . temazepam (RESTORIL) 7.5 MG capsule TAKE 1 CAPSULE BY MOUTH AT BEDTIME AS NEEDED FOR SLEEP 30 capsule 5  . ELIQUIS 2.5 MG TABS tablet Take 1 tablet by mouth twice daily 180 tablet 1   No facility-administered medications prior to visit.    Review of Systems;  Patient denies headache, fevers, malaise, unintentional weight loss, skin rash, eye pain, sinus congestion and sinus pain, sore throat, dysphagia,  hemoptysis , cough, dyspnea, wheezing, chest pain, palpitations, orthopnea, edema, abdominal pain, nausea, melena, diarrhea, constipation, flank pain, dysuria, hematuria, urinary  Frequency, nocturia, numbness, tingling, seizures,  Focal weakness, Loss of consciousness,  Tremor, insomnia, depression, anxiety, and suicidal ideation.      Objective:  BP 120/80 (BP Location: Right Arm, Patient Position: Sitting, Cuff Size: Normal)   Pulse 90   Temp (!) 95.2 F (35.1 C) (Temporal)   Ht 5\' 7"  (1.702 m)   Wt 106 lb 3.2 oz (48.2 kg)   SpO2 99%   BMI 16.63 kg/m  BP Readings from Last 3 Encounters:  05/13/20 120/80  03/14/20 (!) 130/94  11/09/19 140/82    Wt Readings from Last 3 Encounters:  05/13/20 106 lb 3.2 oz (48.2 kg)  03/14/20 111 lb 12.8 oz (50.7 kg)  11/09/19 113 lb 9.6 oz (51.5 kg)    General appearance: alert, cooperative and appears stated age Ears: normal TM's and external ear canals both ears Throat: lips, mucosa, and tongue normal; teeth and gums normal Neck: no adenopathy, no carotid bruit, supple, symmetrical, trachea midline and thyroid not enlarged,  symmetric, no tenderness/mass/nodules Back: symmetric, no curvature. ROM normal. No CVA tenderness. Lungs: clear to auscultation bilaterally Heart: regular rate and rhythm, S1, S2 normal, no murmur, click, rub or gallop Abdomen: soft, non-tender; bowel sounds normal; no masses,  no organomegaly Pulses: 2+ and symmetric Skin: Skin color, texture, turgor normal. No rashes or lesions Lymph nodes: Cervical, supraclavicular, and axillary nodes normal.  Lab Results  Component Value Date   HGBA1C 6.3 03/14/2020   HGBA1C 6.1 11/09/2019   HGBA1C 5.9 (A) 05/04/2019    Lab Results  Component Value Date   CREATININE 0.85 03/14/2020   CREATININE 0.94 11/09/2019   CREATININE 0.89 05/04/2019    Lab Results  Component Value Date   WBC 7.4 03/21/2020   HGB 12.1 03/21/2020   HCT 36.0 03/21/2020   PLT 193.0 03/21/2020   GLUCOSE 117 (H) 03/14/2020   CHOL 131 05/04/2019   TRIG 134.0 05/04/2019   HDL 52.90 05/04/2019   LDLDIRECT 60.0 05/11/2016   LDLCALC 52 05/04/2019   ALT 21 03/14/2020   AST 26 03/14/2020   NA 138 03/14/2020   K 5.1 03/14/2020   CL 102 03/14/2020   CREATININE 0.85 03/14/2020   BUN 23 03/14/2020   CO2 28 03/14/2020   TSH 2.08 05/04/2019   HGBA1C 6.3 03/14/2020   MICROALBUR 3.9 (H) 10/14/2017    DG HIP UNILAT WITH PELVIS 2-3 VIEWS LEFT  Result Date: 02/07/2018 CLINICAL DATA:  Left hip pain, atraumatic. EXAM: DG HIP (WITH OR WITHOUT PELVIS) 2-3V LEFT COMPARISON:  None. FINDINGS: Mild acetabular spurring on both sides. No definite or asymmetric joint narrowing. There is no evidence of fracture or bone lesion. Osteopenia. Intact pelvic ring. IMPRESSION: 1. Symmetric degenerative hip spurring without definite joint narrowing. 2. No acute finding. Electronically Signed   By: Monte Fantasia M.D.   On: 02/07/2018 16:35    Assessment & Plan:   Problem List Items Addressed This Visit      Unprioritized   Acquired thrombophilia ([place])    Secondary to atrial fibrillation.   Eliquis has been discontinued due to cerebellar bleed noted on Nov 2021 head CT following a fall.        B12 deficiency anemia    Continue monthly injections at the office       Depression, major, single episode, mild ([place])    complicated by anxiety. She notes improved mood since husband has recovered from pneumonia .  continue remeron and mirtazipine       Diabetes mellitus with diabetic nephropathy ([place])    Remains well-controlled on current medications.  hemoglobin A1c has been consistently at or  less than 7.0 . Patient is up-to-date on eye exams and foot exam is unchanged today. Patient  Has had  urine microalbumin to creatinine ratio done within the past year. Patient is tolerating statin therapy for CAD risk reduction and on ACE/ARB for reduction in proteinuria.   Lab Results  Component Value Date   HGBA1C  6.3 03/14/2020         Head injury, closed, without LOC    Occurring while anticoagulated with Eliquis on Nov 12.   Head CT noted a small cerebellar bleed of uncertain chronicity,  And no fractures. Eliquis has been discontinued       Proximal leg weakness    Secondary to deconditioning.  Encouraged to start walking daily .  Home PT offered again but deferred       Unintentional weight loss     I have reviewed her diet and recommended that she increase her protein and fat intake while monitoring her carbohydrates.           I provided  30 minutes of  face-to-face time during this encounter reviewing patient's current problems and past surgeries, labs and imaging studies, providing counseling on the above mentioned problems , and coordination  of care .   I have discontinued Raynelle Bring. Heberlein's Eliquis. I am also having her maintain her denosumab, pyridoxine, Lysine, Biotin, diclofenac Sodium, metoprolol tartrate, atorvastatin, temazepam, mirtazapine, glipiZIDE, and losartan.  No orders of the defined types were placed in this encounter.   Medications Discontinued  During This Encounter  Medication Reason  . ELIQUIS 2.5 MG TABS tablet     Follow-up: Return in about 5 weeks (around 06/17/2020).   Crecencio Mc, MD

## 2020-05-15 ENCOUNTER — Encounter: Payer: Self-pay | Admitting: Internal Medicine

## 2020-05-15 NOTE — Assessment & Plan Note (Signed)
Continue monthly injections at the office

## 2020-05-15 NOTE — Assessment & Plan Note (Signed)
Remains well-controlled on current medications.  hemoglobin A1c has been consistently at or  less than 7.0 . Patient is up-to-date on eye exams and foot exam is unchanged today. Patient  Has had  urine microalbumin to creatinine ratio done within the past year. Patient is tolerating statin therapy for CAD risk reduction and on ACE/ARB for reduction in proteinuria.   Lab Results  Component Value Date   HGBA1C 6.3 03/14/2020

## 2020-05-15 NOTE — Assessment & Plan Note (Signed)
Occurring while anticoagulated with Eliquis on Nov 12.   Head CT noted a small cerebellar bleed of uncertain chronicity,  And no fractures. Eliquis has been discontinued

## 2020-05-15 NOTE — Assessment & Plan Note (Signed)
I have reviewed her diet and recommended that she increase her protein and fat intake while monitoring her carbohydrates.  

## 2020-05-15 NOTE — Assessment & Plan Note (Addendum)
complicated by anxiety. She notes improved mood since husband has recovered from pneumonia .  continue remeron and mirtazipine

## 2020-05-15 NOTE — Assessment & Plan Note (Signed)
Secondary to atrial fibrillation.  Eliquis has been discontinued due to cerebellar bleed noted on Nov 2021 head CT following a fall.

## 2020-05-15 NOTE — Assessment & Plan Note (Signed)
Secondary to deconditioning.  Encouraged to start walking daily .  Home PT offered again but deferred  

## 2020-05-16 ENCOUNTER — Other Ambulatory Visit: Payer: Self-pay | Admitting: Cardiovascular Disease

## 2020-05-16 ENCOUNTER — Other Ambulatory Visit: Payer: Self-pay | Admitting: Internal Medicine

## 2020-05-17 ENCOUNTER — Other Ambulatory Visit: Payer: Self-pay | Admitting: Internal Medicine

## 2020-05-17 MED ORDER — CYANOCOBALAMIN 1000 MCG/ML IJ SOLN: 1000.0000 ug | INTRAMUSCULAR | 4 refills | Status: DC

## 2020-05-17 MED ORDER — "SYRINGE 25G X 1"" 3 ML MISC": 0 refills | Status: AC

## 2020-05-17 NOTE — Telephone Encounter (Signed)
The prolia should have been scheduled at the same time as her  lab appointment (as I stated on the after visit summary) so can you f address this and confirm the date with mr Mcwherter the son?

## 2020-06-14 ENCOUNTER — Ambulatory Visit: Payer: Medicare HMO

## 2020-06-15 ENCOUNTER — Other Ambulatory Visit: Payer: Self-pay | Admitting: Cardiovascular Disease

## 2020-06-17 ENCOUNTER — Encounter: Payer: Self-pay | Admitting: Internal Medicine

## 2020-06-17 ENCOUNTER — Ambulatory Visit (INDEPENDENT_AMBULATORY_CARE_PROVIDER_SITE_OTHER): Payer: Medicare HMO | Admitting: Internal Medicine

## 2020-06-17 ENCOUNTER — Other Ambulatory Visit: Payer: Self-pay

## 2020-06-17 VITALS — BP 122/88 | HR 83 | Temp 97.8°F | Resp 15 | Ht 67.0 in | Wt 107.0 lb

## 2020-06-17 DIAGNOSIS — R682 Dry mouth, unspecified: Secondary | ICD-10-CM | POA: Diagnosis not present

## 2020-06-17 DIAGNOSIS — F320 Major depressive disorder, single episode, mild: Secondary | ICD-10-CM

## 2020-06-17 DIAGNOSIS — R69 Illness, unspecified: Secondary | ICD-10-CM | POA: Diagnosis not present

## 2020-06-17 DIAGNOSIS — R29898 Other symptoms and signs involving the musculoskeletal system: Secondary | ICD-10-CM | POA: Diagnosis not present

## 2020-06-17 DIAGNOSIS — E1321 Other specified diabetes mellitus with diabetic nephropathy: Secondary | ICD-10-CM

## 2020-06-17 DIAGNOSIS — M810 Age-related osteoporosis without current pathological fracture: Secondary | ICD-10-CM | POA: Diagnosis not present

## 2020-06-17 LAB — POCT GLYCOSYLATED HEMOGLOBIN (HGB A1C): Hemoglobin A1C: 5.8 % — AB (ref 4.0–5.6)

## 2020-06-17 MED ORDER — DENOSUMAB 60 MG/ML ~~LOC~~ SOSY
60.0000 mg | PREFILLED_SYRINGE | Freq: Once | SUBCUTANEOUS | Status: AC
  Administered 2020-06-17: 60 mg via SUBCUTANEOUS

## 2020-06-17 NOTE — Patient Instructions (Signed)
Your diabetes is under [place] to start being more active if you want to remain AT HOME!  DRINK MORE WATER.  WHENEVER YOUR MOUTH FEELS DRY  WALK FOR TEN MINUTES EVERY 2-3 HOURS.    REWARD YOURSELF FOR WALKING WITH A COOKIE!  RETURN IN 3 MONTHS

## 2020-06-17 NOTE — Progress Notes (Signed)
Subjective:  Patient ID: Cheyenne Torres, female    DOB: 1932/04/18  Age: 87 y.o. MRN: [id number]  CC: The primary encounter diagnosis was Other specified diabetes mellitus with diabetic nephropathy, without long-term current use of insulin ([place]). Diagnoses of Age-related osteoporosis without current pathological fracture, Depression, major, single episode, mild ([place]), Dry mouth, unspecified, and Proximal leg weakness were also pertinent to this visit.  HPI Cheyenne Torres presents for FOLLOW UP on underweight, depression and diabetes   This visit occurred during the SARS-CoV-2 public health emergency.  Safety protocols were in place, including screening questions prior to the visit, additional usage of staff PPE, and extensive cleaning of exam room while observing appropriate contact time as indicated for disinfecting solutions.     T2DM:  She  feels generally well,  But is not walking regularly.  She is underweight  Checking  blood sugars less than once daily at variable times, usually only if she feels she may be having a hypoglycemic event. .  BS have been under 130 fasting and < 150 post prandially.  Denies any recent hypoglyemic events.  Taking   medications as directed. Following a carbohydrate modified diet 6 days per week. Denies numbness, burning and tingling of extremities. Appetite is poor despite maximal dose of remeron and d/c of metformin over a year ago .   Proximal muscle weakness:  She has difficulty rising from a chair and spends most of her day bundled under a blanket on the couch   Outpatient Medications Prior to Visit  Medication Sig Dispense Refill  . atorvastatin (LIPITOR) 20 MG tablet Take 1 tablet by mouth once daily 90 tablet 1  . Biotin 10000 MCG TABS Take 10,000 mcg by mouth daily.    . cyanocobalamin (,VITAMIN B-12,) 1000 MCG/ML injection Inject 1 mL (1,000 mcg total) into the muscle every 30 (thirty) days. 3 mL 4  . denosumab (PROLIA) 60 MG/ML SOLN injection  Inject 60 mg into the skin every 6 (six) months. Administer in upper arm, thigh, or abdomen 1 mL 0  . diclofenac Sodium (VOLTAREN) 1 % GEL Apply 2 g topically 4 (four) times daily. 100 g 5  . ELIQUIS 2.5 MG TABS tablet Take 2.5 mg by mouth 2 (two) times daily.    Marland Kitchen glipiZIDE (GLUCOTROL XL) 2.5 MG 24 hr tablet Take 1 tablet by mouth once daily with breakfast 90 tablet 0  . losartan (COZAAR) 100 MG tablet Take 1 tablet by mouth once daily 90 tablet 0  . Lysine 500 MG TABS Take 500 mg by mouth daily.    . metoprolol tartrate (LOPRESSOR) 50 MG tablet Take 2 tablets by mouth twice daily 120 tablet 1  . mirtazapine (REMERON) 45 MG tablet TAKE 1 TABLET BY MOUTH AT BEDTIME 30 tablet 5  . pyridoxine (B-6) 500 MG tablet Take 1 tablet (500 mg total) by mouth daily. 90 tablet 1  . Syringe/Needle, Disp, (SYRINGE 3CC/25GX1") 25G X 1" 3 ML MISC Use for b12 injections 50 each 0  . temazepam (RESTORIL) 7.5 MG capsule TAKE 1 CAPSULE BY MOUTH AT BEDTIME AS NEEDED FOR SLEEP 30 capsule 5   No facility-administered medications prior to visit.    Review of Systems;  Patient denies headache, fevers, malaise, unintentional weight loss, skin rash, eye pain, sinus congestion and sinus pain, sore throat, dysphagia,  hemoptysis , cough, dyspnea, wheezing, chest pain, palpitations, orthopnea, edema, abdominal pain, nausea, melena, diarrhea, constipation, flank pain, dysuria, hematuria, urinary  Frequency, nocturia, numbness, tingling, seizures,  Focal weakness, Loss of consciousness,  Tremor, untreated insomnia, depression, anxiety, and suicidal ideation.      Objective:  BP 122/88 (BP Location: Right Arm, Patient Position: Sitting, Cuff Size: Normal)   Pulse 83   Temp 97.8 F (36.6 C) (Oral)   Resp 15   Ht 5\' 7"  (1.702 m)   Wt 107 lb (48.5 kg)   SpO2 99%   BMI 16.76 kg/m   BP Readings from Last 3 Encounters:  06/17/20 122/88  05/13/20 120/80  03/14/20 (!) 130/94    Wt Readings from Last 3 Encounters:   06/17/20 107 lb (48.5 kg)  05/13/20 106 lb 3.2 oz (48.2 kg)  03/14/20 111 lb 12.8 oz (50.7 kg)    General appearance: alert, cooperative and appears stated age Ears: normal TM's and external ear canals both ears Throat: lips, mucosa, and tongue normal; teeth and gums normal Neck: no adenopathy, no carotid bruit, supple, symmetrical, trachea midline and thyroid not enlarged, symmetric, no tenderness/mass/nodules Back: symmetric, no curvature. ROM normal. No CVA tenderness. Lungs: clear to auscultation bilaterally Heart: regular rate and rhythm, S1, S2 normal, no murmur, click, rub or gallop Abdomen: soft, non-tender; bowel sounds normal; no masses,  no organomegaly Pulses: 2+ and symmetric Skin: Skin color, texture, turgor normal. No rashes or lesions Lymph nodes: Cervical, supraclavicular, and axillary nodes normal.  Lab Results  Component Value Date   HGBA1C 5.8 (A) 06/17/2020   HGBA1C 6.3 03/14/2020   HGBA1C 6.1 11/09/2019    Lab Results  Component Value Date   CREATININE 0.85 03/14/2020   CREATININE 0.94 11/09/2019   CREATININE 0.89 05/04/2019    Lab Results  Component Value Date   WBC 7.4 03/21/2020   HGB 12.1 03/21/2020   HCT 36.0 03/21/2020   PLT 193.0 03/21/2020   GLUCOSE 117 (H) 03/14/2020   CHOL 131 05/04/2019   TRIG 134.0 05/04/2019   HDL 52.90 05/04/2019   LDLDIRECT 60.0 05/11/2016   LDLCALC 52 05/04/2019   ALT 21 03/14/2020   AST 26 03/14/2020   NA 138 03/14/2020   K 5.1 03/14/2020   CL 102 03/14/2020   CREATININE 0.85 03/14/2020   BUN 23 03/14/2020   CO2 28 03/14/2020   TSH 2.08 05/04/2019   HGBA1C 5.8 (A) 06/17/2020   MICROALBUR 3.9 (H) 10/14/2017    DG HIP UNILAT WITH PELVIS 2-3 VIEWS LEFT  Result Date: 02/07/2018 CLINICAL DATA:  Left hip pain, atraumatic. EXAM: DG HIP (WITH OR WITHOUT PELVIS) 2-3V LEFT COMPARISON:  None. FINDINGS: Mild acetabular spurring on both sides. No definite or asymmetric joint narrowing. There is no evidence of  fracture or bone lesion. Osteopenia. Intact pelvic ring. IMPRESSION: 1. Symmetric degenerative hip spurring without definite joint narrowing. 2. No acute finding. Electronically Signed   By: Monte Fantasia M.D.   On: 02/07/2018 16:35    Assessment & Plan:   Problem List Items Addressed This Visit      Unprioritized   Depression, major, single episode, mild ([place])    complicated by anxiety. She notes improved mood since husband has recovered from pneumonia .  continue remeron      Relevant Orders   AMB Referral to [facility] Coordin[facility]   Diabetes mellitus with diabetic nephropathy ([place]) - Primary    Remains well-controlled on current medications.  hemoglobin A1c has been consistently at or  less than 6.0 . Patient is up-to-date on eye exams and foot exam is unchanged today. Patient  Has had  urine microalbumin to creatinine ratio done  within the past year. Patient is tolerating statin therapy for CAD risk reduction and on ACE/ARB for reduction in proteinuria.   Lab Results  Component Value Date   HGBA1C 5.8 (A) 06/17/2020         Relevant Orders   POCT HgB A1C (Completed)   Dry mouth, unspecified    Reviewed medications..  No contributors.  Reviewed hydration needs.  She does not drink enough water.       Osteoporosis   Proximal leg weakness    Secondary to deconditioning.  Encouraged to start walking daily .  Home PT offered again but deferred       Relevant Orders   AMB Referral to [place]      I am having Raynelle Bring. Owens Shark maintain her denosumab, pyridoxine, Lysine, Biotin, diclofenac Sodium, atorvastatin, temazepam, mirtazapine, losartan, glipiZIDE, SYRINGE 3CC/25GX1", cyanocobalamin, metoprolol tartrate, and Eliquis. We administered denosumab.  Meds ordered this encounter  Medications  . denosumab (PROLIA) injection 60 mg    There are no discontinued medications.  Follow-up: Return in about 3 months (around 09/14/2020).   Crecencio Mc,  MD

## 2020-06-19 NOTE — Assessment & Plan Note (Signed)
complicated by anxiety. She notes improved mood since husband has recovered from pneumonia .  continue remeron

## 2020-06-19 NOTE — Assessment & Plan Note (Signed)
Secondary to deconditioning.  Encouraged to start walking daily .  Home PT offered again but deferred

## 2020-06-19 NOTE — Assessment & Plan Note (Signed)
Remains well-controlled on current medications.  hemoglobin A1c has been consistently at or  less than 6.0 . Patient is up-to-date on eye exams and foot exam is unchanged today. Patient  Has had  urine microalbumin to creatinine ratio done within the past year. Patient is tolerating statin therapy for CAD risk reduction and on ACE/ARB for reduction in proteinuria.   Lab Results  Component Value Date   HGBA1C 5.8 (A) 06/17/2020

## 2020-06-19 NOTE — Assessment & Plan Note (Signed)
Reviewed medications..  No contributors.  Reviewed hydration needs.  She does not drink enough water.

## 2020-06-20 ENCOUNTER — Telehealth: Payer: Self-pay | Admitting: *Deleted

## 2020-06-20 ENCOUNTER — Telehealth: Payer: Self-pay

## 2020-06-20 NOTE — Chronic Care Management (AMB) (Signed)
  Chronic Care Management   Note  06/20/2020 Name: Cheyenne Torres MRN: [id number] DOB: [birth year]  Cheyenne Torres is a 87 y.o. year old female who is a primary care patient of Derrel Nip, Aris Everts, MD. I reached out to [name] by phone today in response to a referral sent by [name]'s PCP, Crecencio Mc, MD     [name] was given information about Chronic Care Management services today including:  1. CCM service includes personalized support from designated clinical staff supervised by her physician, including individualized plan of care and coordination with other care providers 2. 24/7 contact phone numbers for assistance for urgent and routine care needs. 3. Service will only be billed when office clinical staff spend 20 minutes or more in a month to coordinate care. 4. Only one practitioner may furnish and bill the service in a calendar month. 5. The patient may stop CCM services at any time (effective at the end of the month) by phone call to the office staff. 6. The patient will be responsible for cost sharing (co-pay) of up to 20% of the service fee (after annual deductible is met).  Patient did not agree to enrollment in care management services and does not wish to consider at this time.  Follow up plan: Marland Kitchen The care management team is available to follow up with the patient after provider conversation with the patient regarding recommendation for care management engagement and subsequent re-referral to the care management team.   [facility]

## 2020-06-20 NOTE — Progress Notes (Signed)
ERROR

## 2020-07-04 ENCOUNTER — Other Ambulatory Visit: Payer: Self-pay | Admitting: Cardiovascular Disease

## 2020-07-04 ENCOUNTER — Telehealth: Payer: Self-pay

## 2020-07-04 ENCOUNTER — Other Ambulatory Visit: Payer: Self-pay | Admitting: Internal Medicine

## 2020-07-04 MED ORDER — MIRTAZAPINE 45 MG PO TABS: 45.0000 mg | ORAL_TABLET | Freq: Every day | ORAL | 5 refills | Status: AC

## 2020-07-04 NOTE — Addendum Note (Signed)
Addended by: Crecencio Mc on: 07/04/2020 01:47 PM   Modules accepted: Orders

## 2020-07-04 NOTE — Telephone Encounter (Signed)
Refilled

## 2020-07-04 NOTE — Telephone Encounter (Signed)
RX Refill:restoril Last Seen:06-17-20 Last ordered:12-21-19

## 2020-07-08 ENCOUNTER — Other Ambulatory Visit: Payer: Self-pay | Admitting: Internal Medicine

## 2020-07-08 MED ORDER — TEMAZEPAM 7.5 MG PO CAPS: 7.5000 mg | ORAL_CAPSULE | Freq: Every evening | ORAL | 5 refills | Status: AC | PRN

## 2020-07-12 ENCOUNTER — Other Ambulatory Visit: Payer: Self-pay | Admitting: Internal Medicine

## 2020-07-18 ENCOUNTER — Telehealth: Payer: Self-pay

## 2020-07-18 NOTE — Telephone Encounter (Signed)
PA for Temazepam has been submitted on covermymeds. 

## 2020-07-19 NOTE — Telephone Encounter (Signed)
Doxepin is contraindicated because she is taking mirtazipine for depression/anorexia

## 2020-07-19 NOTE — Telephone Encounter (Signed)
Appeal form has been completed and placed in quick sign folder for signature.

## 2020-07-19 NOTE — Telephone Encounter (Signed)
Temazepam has been denied by Medicare because she has not had a trial run of Doxepin 3 mg or 6 mg.

## 2020-07-19 NOTE — Telephone Encounter (Signed)
I will submit an appeal.

## 2020-07-21 ENCOUNTER — Other Ambulatory Visit: Payer: Self-pay | Admitting: Cardiovascular Disease

## 2020-07-28 ENCOUNTER — Other Ambulatory Visit: Payer: Self-pay

## 2020-07-28 ENCOUNTER — Ambulatory Visit: Payer: Medicare HMO | Admitting: Cardiovascular Disease

## 2020-07-28 ENCOUNTER — Encounter: Payer: Self-pay | Admitting: Cardiovascular Disease

## 2020-07-28 VITALS — BP 140/80 | HR 93 | Ht 67.0 in | Wt 111.0 lb

## 2020-07-28 DIAGNOSIS — I4821 Permanent atrial fibrillation: Secondary | ICD-10-CM

## 2020-07-28 DIAGNOSIS — I10 Essential (primary) hypertension: Secondary | ICD-10-CM | POA: Diagnosis not present

## 2020-07-28 DIAGNOSIS — R600 Localized edema: Secondary | ICD-10-CM

## 2020-07-28 MED ORDER — LOSARTAN POTASSIUM 100 MG PO TABS: 100.0000 mg | ORAL_TABLET | Freq: Every day | ORAL | 1 refills | Status: AC
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
## 2020-07-28 NOTE — Patient Instructions (Signed)

## 2020-08-02 DIAGNOSIS — E119 Type 2 diabetes mellitus without complications: Secondary | ICD-10-CM | POA: Diagnosis not present

## 2020-08-02 DIAGNOSIS — H524 Presbyopia: Secondary | ICD-10-CM | POA: Diagnosis not present

## 2020-08-02 LAB — HM DIABETES EYE EXAM

## 2020-08-05 ENCOUNTER — Other Ambulatory Visit: Payer: Self-pay

## 2020-08-05 MED ORDER — GLIPIZIDE ER 2.5 MG PO TB24: 2.5000 mg | ORAL_TABLET | Freq: Every day | ORAL | 1 refills | Status: AC

## 2020-08-10 ENCOUNTER — Ambulatory Visit: Payer: Medicare HMO

## 2020-08-22 ENCOUNTER — Other Ambulatory Visit: Payer: Self-pay | Admitting: Cardiovascular Disease

## 2020-08-22 NOTE — Telephone Encounter (Signed)
51F, 50.3KG, SCR 0.85 03/14/20, LOVW/ARIDA 07/28/20

## 2020-08-22 NOTE — Telephone Encounter (Signed)
Refill Request.  

## 2020-09-12 ENCOUNTER — Other Ambulatory Visit: Payer: Self-pay | Admitting: Cardiovascular Disease

## 2020-09-28 ENCOUNTER — Other Ambulatory Visit: Payer: Self-pay

## 2020-09-28 ENCOUNTER — Encounter: Payer: Self-pay | Admitting: Internal Medicine

## 2020-09-28 ENCOUNTER — Ambulatory Visit (INDEPENDENT_AMBULATORY_CARE_PROVIDER_SITE_OTHER): Payer: Medicare HMO | Admitting: Internal Medicine

## 2020-09-28 VITALS — BP 128/82 | HR 83 | Temp 97.3°F | Resp 15 | Ht 67.0 in | Wt 110.0 lb

## 2020-09-28 DIAGNOSIS — E039 Hypothyroidism, unspecified: Secondary | ICD-10-CM | POA: Diagnosis not present

## 2020-09-28 DIAGNOSIS — R69 Illness, unspecified: Secondary | ICD-10-CM | POA: Diagnosis not present

## 2020-09-28 DIAGNOSIS — K5909 Other constipation: Secondary | ICD-10-CM | POA: Diagnosis not present

## 2020-09-28 DIAGNOSIS — F320 Major depressive disorder, single episode, mild: Secondary | ICD-10-CM

## 2020-09-28 DIAGNOSIS — R29898 Other symptoms and signs involving the musculoskeletal system: Secondary | ICD-10-CM | POA: Diagnosis not present

## 2020-09-28 DIAGNOSIS — E1321 Other specified diabetes mellitus with diabetic nephropathy: Secondary | ICD-10-CM | POA: Diagnosis not present

## 2020-09-28 DIAGNOSIS — E559 Vitamin D deficiency, unspecified: Secondary | ICD-10-CM | POA: Diagnosis not present

## 2020-09-28 DIAGNOSIS — I482 Chronic atrial fibrillation, unspecified: Secondary | ICD-10-CM | POA: Diagnosis not present

## 2020-09-28 LAB — COMPREHENSIVE METABOLIC PANEL
ALT: 20 U/L (ref 0–35)
AST: 21 U/L (ref 0–37)
Albumin: 4.3 g/dL (ref 3.5–5.2)
Alkaline Phosphatase: 75 U/L (ref 39–117)
BUN: 27 mg/dL — ABNORMAL HIGH (ref 6–23)
CO2: 26 mEq/L (ref 19–32)
Calcium: 9.7 mg/dL (ref 8.4–10.5)
Chloride: 103 mEq/L (ref 96–112)
Creatinine, Ser: 1.11 mg/dL (ref 0.40–1.20)
GFR: 44.91 mL/min — ABNORMAL LOW (ref >60.00)
Glucose, Bld: 96 mg/dL (ref 70–99)
Potassium: 5 mEq/L (ref 3.5–5.1)
Sodium: 139 mEq/L (ref 135–145)
Total Bilirubin: 0.6 mg/dL (ref 0.2–1.2)
Total Protein: 7.2 g/dL (ref 6.0–8.3)

## 2020-09-28 LAB — TSH: TSH: 4.25 u[IU]/mL (ref 0.35–4.50)

## 2020-09-28 LAB — HEMOGLOBIN A1C: Hgb A1c MFr Bld: 6.1 % (ref 4.6–6.5)

## 2020-09-28 LAB — VITAMIN D 25 HYDROXY (VIT D DEFICIENCY, FRACTURES): VITD: 19.97 ng/mL — ABNORMAL LOW (ref 30.00–100.00)

## 2020-09-28 MED ORDER — CYANOCOBALAMIN 1000 MCG/ML IJ SOLN: 1000.0000 ug | INTRAMUSCULAR | 3 refills | Status: AC

## 2020-09-28 NOTE — Patient Instructions (Addendum)
Your blood pressure and blood sugars are fine.  Continue your current medications for now until you hear from me about your labs   Continue b12 injections every  2 to 4 weeks  To keep level up. It is harmless   You should start taking 2000 Ius of Vitamin D3 daily    whe you have this chest pain:  1) What are you  Doing  At the time it occurs ?  2) What did you recently eat?  3)  Does it come with nausea,  Shortness  Of breath ,  Left arm or jaw pain?  4) does it go away with tums or rolaids?   For constipation ,   Increase water intake gradually to a goal of 60 ounces daily    You can use the following laxatives daily if needed   Bulk  forming laxatives   Ok to use daily :  Citrucel, benefiber, metamucil, Fibercon, miralax  Stool softener (docusate ; there's only one) :  ok to use daily  In the evenin    Avoid Stimulant laxatives (Ex Lax,  Correctol,  Senna,  Dulcolax)  :  avoid on a daily basis, not more than 2/weel   Cathartic laxatives ( MOM,  Mag citrate, Lactulose ) : not more than 2 /week

## 2020-09-28 NOTE — Progress Notes (Signed)
Subjective:  Patient ID: Cheyenne Torres, female    DOB: January 21, 1932  Age: 87 y.o. MRN: [id number]  CC: The primary encounter diagnosis was Acquired hypothyroidism. Diagnoses of Vitamin D deficiency, Other specified diabetes mellitus with diabetic nephropathy, without long-term current use of insulin ([place]), Depression, major, single episode, mild ([place]), Proximal leg weakness, Chronic atrial fibrillation ([place]), and Other constipation were also pertinent to this visit.  HPI Cheyenne Torres presents for follow up on type 2 DM and other issues.  This visit occurred during the SARS-CoV-2 public health emergency.  Safety protocols were in place, including screening questions prior to the visit, additional usage of staff PPE, and extensive cleaning of exam room while observing appropriate contact time as indicated for disinfecting solutions.   T2DM:  She  feels generally well,  But is not  exercising regularly .  Appetite is poor.  Checking  blood sugars   once daily at variable times,  BS have been under 110  fasting .  She denies any recent hypoglyemic events.  Taking   medications as directed. Following a carbohydrate modified diet 6 days per week. Denies numbness, burning and tingling of extremities..    Eye exam in April.   fastings < 110.    Reports episodes of SSCP lasting 1 minute occurring every 1-2 weeks.  Described as "stinging" ,  Occurring at rest  Resolving spontaneously, not accompanied by diaphoresis, dyspnea  Jaw pain or arm pain.  Constipation  , takes dulcolax then ends up with diarrhea.  Water intake is < 48 ounces of water daily    Outpatient Medications Prior to Visit  Medication Sig Dispense Refill  . atorvastatin (LIPITOR) 20 MG tablet Take 1 tablet by mouth once daily 90 tablet 0  . Biotin 10000 MCG TABS Take 10,000 mcg by mouth daily.    Marland Kitchen denosumab (PROLIA) 60 MG/ML SOLN injection Inject 60 mg into the skin every 6 (six) months. Administer in upper arm, thigh, or abdomen 1  mL 0  . diclofenac Sodium (VOLTAREN) 1 % GEL Apply 2 g topically 4 (four) times daily. 100 g 5  . ELIQUIS 2.5 MG TABS tablet Take 1 tablet by mouth twice daily 180 tablet 1  . glipiZIDE (GLUCOTROL XL) 2.5 MG 24 hr tablet Take 1 tablet (2.5 mg total) by mouth daily with breakfast. 100 tablet 1  . losartan (COZAAR) 100 MG tablet Take 1 tablet (100 mg total) by mouth daily. 100 tablet 1  . Lysine 500 MG TABS Take 500 mg by mouth daily.    . metoprolol tartrate (LOPRESSOR) 50 MG tablet Take 2 tablets by mouth twice daily 120 tablet 5  . mirtazapine (REMERON) 45 MG tablet Take 1 tablet (45 mg total) by mouth at bedtime. 30 tablet 5  . pyridoxine (B-6) 500 MG tablet Take 1 tablet (500 mg total) by mouth daily. 90 tablet 1  . Syringe/Needle, Disp, (SYRINGE 3CC/25GX1") 25G X 1" 3 ML MISC Use for b12 injections 50 each 0  . temazepam (RESTORIL) 7.5 MG capsule Take 1 capsule (7.5 mg total) by mouth at bedtime as needed for sleep. 30 capsule 5  . cyanocobalamin (,VITAMIN B-12,) 1000 MCG/ML injection Inject 1 mL (1,000 mcg total) into the muscle every 30 (thirty) days. 3 mL 4   No facility-administered medications prior to visit.    Review of Systems;  Patient denies headache, fevers, malaise, unintentional weight loss, skin rash, eye pain, sinus congestion and sinus pain, sore throat, dysphagia,  hemoptysis , cough,  dyspnea, wheezing, chest pain, palpitations, orthopnea, edema, abdominal pain, nausea, melena, diarrhea, constipation, flank pain, dysuria, hematuria, urinary  Frequency, nocturia, numbness, tingling, seizures,  Focal weakness, Loss of consciousness,  Tremor, insomnia, depression, anxiety, and suicidal ideation.      Objective:  BP 128/82 (BP Location: Right Arm, Patient Position: Sitting, Cuff Size: Normal)   Pulse 83   Temp (!) 97.3 F (36.3 C) (Temporal)   Resp 15   Ht 5\' 7"  (1.702 m)   Wt 110 lb (49.9 kg)   SpO2 98%   BMI 17.23 kg/m   BP Readings from Last 3 Encounters:   09/28/20 128/82  07/28/20 140/80  06/17/20 122/88    Wt Readings from Last 3 Encounters:  09/28/20 110 lb (49.9 kg)  07/28/20 111 lb (50.3 kg)  06/17/20 107 lb (48.5 kg)    General appearance: alert, cooperative and appears stated age Ears: normal TM's and external ear canals both ears Throat: lips, mucosa, and tongue normal; teeth and gums normal Neck: no adenopathy, no carotid bruit, supple, symmetrical, trachea midline and thyroid not enlarged, symmetric, no tenderness/mass/nodules Back: symmetric, no curvature. ROM normal. No CVA tenderness. Lungs: clear to auscultation bilaterally Heart: regular rate and rhythm, S1, S2 normal, no murmur, click, rub or gallop Abdomen: soft, non-tender; bowel sounds normal; no masses,  no organomegaly Pulses: 2+ and symmetric Skin: Skin color, texture, turgor normal. No rashes or lesions Lymph nodes: Cervical, supraclavicular, and axillary nodes normal.  Lab Results  Component Value Date   HGBA1C 6.1 09/28/2020   HGBA1C 5.8 (A) 06/17/2020   HGBA1C 6.3 03/14/2020    Lab Results  Component Value Date   CREATININE 1.11 09/28/2020   CREATININE 0.85 03/14/2020   CREATININE 0.94 11/09/2019    Lab Results  Component Value Date   WBC 7.4 03/21/2020   HGB 12.1 03/21/2020   HCT 36.0 03/21/2020   PLT 193.0 03/21/2020   GLUCOSE 96 09/28/2020   CHOL 131 05/04/2019   TRIG 134.0 05/04/2019   HDL 52.90 05/04/2019   LDLDIRECT 60.0 05/11/2016   LDLCALC 52 05/04/2019   ALT 20 09/28/2020   AST 21 09/28/2020   NA 139 09/28/2020   K 5.0 09/28/2020   CL 103 09/28/2020   CREATININE 1.11 09/28/2020   BUN 27 (H) 09/28/2020   CO2 26 09/28/2020   TSH 4.25 09/28/2020   HGBA1C 6.1 09/28/2020   MICROALBUR 3.9 (H) 10/14/2017    DG HIP UNILAT WITH PELVIS 2-3 VIEWS LEFT  Result Date: 02/07/2018 CLINICAL DATA:  Left hip pain, atraumatic. EXAM: DG HIP (WITH OR WITHOUT PELVIS) 2-3V LEFT COMPARISON:  None. FINDINGS: Mild acetabular spurring on both  sides. No definite or asymmetric joint narrowing. There is no evidence of fracture or bone lesion. Osteopenia. Intact pelvic ring. IMPRESSION: 1. Symmetric degenerative hip spurring without definite joint narrowing. 2. No acute finding. Electronically Signed   By: Monte Fantasia M.D.   On: 02/07/2018 16:35    Assessment & Plan:   Problem List Items Addressed This Visit      Unprioritized   Chronic atrial fibrillation ([place])    Rate controlled currently. Embolic risk managed with Eliqius        Constipation     Encouraged to  Increase her water intake to 60 ounces daily gradually and  Add   miralax, metamucil, fibercon, or citrucel daily to supplement her fiber. She was advised that she  can also combine them daily with colace       Depression, major, single  episode, mild ([place])    complicated by anxiety. She notes improved mood since husband has recovered from pneumonia .  continue remeron      Diabetes mellitus with diabetic nephropathy ([place])    Remains well-controlled on minimal dose of glipzide,  Metformin c/i due to underweight .   hemoglobin A1c has been consistently at or  less than 6.5 . Patient is up-to-date on eye exams and foot exam is unchanged today. Patient  Has had  urine microalbumin to creatinine ratio done within the past year. Patient is tolerating statin therapy for CAD risk reduction and on ACE/ARB for reduction in proteinuria.   Lab Results  Component Value Date   HGBA1C 6.1 09/28/2020         Relevant Orders   Hemoglobin A1c (Completed)   Comprehensive metabolic panel (Completed)   Hypothyroid - Primary   Relevant Orders   TSH (Completed)   Proximal leg weakness    Encouraged to increase her time spent walking daily       Vitamin D deficiency    Recurrently low.  Resume Drisdol      Relevant Orders   VITAMIN D 25 Hydroxy (Vit-D Deficiency, Fractures) (Completed)      I have changed [place] C. Vallas's cyanocobalamin. I am also having her start  on ergocalciferol. Additionally, I am having her maintain her denosumab, pyridoxine, Lysine, Biotin, diclofenac Sodium, SYRINGE 3CC/25GX1", mirtazapine, temazepam, losartan, glipiZIDE, metoprolol tartrate, Eliquis, and atorvastatin.  Meds ordered this encounter  Medications  . cyanocobalamin (,VITAMIN B-12,) 1000 MCG/ML injection    Sig: Inject 1 mL (1,000 mcg total) into the muscle every 14 (fourteen) days.    Dispense:  6 mL    Refill:  3  . ergocalciferol (VITAMIN D2) 1.25 MG (50000 UT) capsule    Sig: Take 1 capsule (50,000 Units total) by mouth once a week.    Dispense:  12 capsule    Refill:  3    Medications Discontinued During This Encounter  Medication Reason  . cyanocobalamin (,VITAMIN B-12,) 1000 MCG/ML injection Reorder    Follow-up: Return in about 6 months (around 03/30/2021) for follow up diabetes.   Crecencio Mc, MD

## 2020-09-29 DIAGNOSIS — K5900 Constipation, unspecified: Secondary | ICD-10-CM | POA: Insufficient documentation

## 2020-09-29 MED ORDER — ERGOCALCIFEROL 1.25 MG (50000 UT) PO CAPS: 50000.0000 [IU] | ORAL_CAPSULE | ORAL | 3 refills | Status: AC

## 2020-09-29 NOTE — Assessment & Plan Note (Signed)
  Encouraged to  Increase her water intake to 60 ounces daily gradually and  Add   miralax, metamucil, fibercon, or citrucel daily to supplement her fiber. She was advised that she  can also combine them daily with colace

## 2020-09-29 NOTE — Assessment & Plan Note (Signed)
Rate controlled currently. Embolic risk managed with Eliqius

## 2020-09-29 NOTE — Assessment & Plan Note (Signed)
Recurrently low.  Resume Drisdol

## 2020-09-29 NOTE — Assessment & Plan Note (Signed)
complicated by anxiety. She notes improved mood since husband has recovered from pneumonia .  continue remeron

## 2020-09-29 NOTE — Assessment & Plan Note (Signed)
Encouraged to increase her time spent walking daily

## 2020-09-29 NOTE — Assessment & Plan Note (Addendum)
Remains well-controlled on minimal dose of glipzide,  Metformin c/i due to underweight .   hemoglobin A1c has been consistently at or  less than 6.5 . Patient is up-to-date on eye exams and foot exam is unchanged today. Patient  Has had  urine microalbumin to creatinine ratio done within the past year. Patient is tolerating statin therapy for CAD risk reduction and on ACE/ARB for reduction in proteinuria.   Lab Results  Component Value Date   HGBA1C 6.1 09/28/2020

## 2020-10-02 ENCOUNTER — Other Ambulatory Visit: Payer: Self-pay | Admitting: Internal Medicine

## 2020-11-02 ENCOUNTER — Telehealth: Payer: Self-pay | Admitting: Internal Medicine

## 2020-11-02 NOTE — Telephone Encounter (Signed)
FYI

## 2020-11-02 NOTE — Telephone Encounter (Signed)
Patient needed to ask DPR about transportation for Prolia injection she will call back to schedule on or after 11/10/20, please schedule patient on or after 11/10/20

## 2020-11-02 NOTE — Telephone Encounter (Signed)
PT son set up apt for PT online to have Prolia Injection for the Sept 8. PT son wanted to relay this info to [place] so we were aware.

## 2020-12-03 DIAGNOSIS — J9600 Acute respiratory failure, unspecified whether with hypoxia or hypercapnia: Secondary | ICD-10-CM | POA: Diagnosis not present

## 2020-12-03 DIAGNOSIS — I610 Nontraumatic intracerebral hemorrhage in hemisphere, subcortical: Secondary | ICD-10-CM | POA: Diagnosis not present

## 2020-12-03 DIAGNOSIS — Z7901 Long term (current) use of anticoagulants: Secondary | ICD-10-CM | POA: Diagnosis not present

## 2020-12-03 DIAGNOSIS — R0902 Hypoxemia: Secondary | ICD-10-CM | POA: Diagnosis not present

## 2020-12-03 DIAGNOSIS — I4891 Unspecified atrial fibrillation: Secondary | ICD-10-CM | POA: Diagnosis not present

## 2020-12-03 DIAGNOSIS — J9690 Respiratory failure, unspecified, unspecified whether with hypoxia or hypercapnia: Secondary | ICD-10-CM | POA: Diagnosis not present

## 2020-12-03 DIAGNOSIS — R0602 Shortness of breath: Secondary | ICD-10-CM | POA: Diagnosis not present

## 2020-12-03 DIAGNOSIS — S0003XA Contusion of scalp, initial encounter: Secondary | ICD-10-CM | POA: Diagnosis not present

## 2020-12-03 DIAGNOSIS — I629 Nontraumatic intracranial hemorrhage, unspecified: Secondary | ICD-10-CM | POA: Diagnosis not present

## 2020-12-03 DIAGNOSIS — R4020 Unspecified coma: Secondary | ICD-10-CM | POA: Diagnosis not present

## 2020-12-03 DIAGNOSIS — E1165 Type 2 diabetes mellitus with hyperglycemia: Secondary | ICD-10-CM | POA: Diagnosis not present

## 2020-12-03 DIAGNOSIS — J439 Emphysema, unspecified: Secondary | ICD-10-CM | POA: Diagnosis not present

## 2020-12-03 DIAGNOSIS — R404 Transient alteration of awareness: Secondary | ICD-10-CM | POA: Diagnosis not present

## 2020-12-03 DIAGNOSIS — R4182 Altered mental status, unspecified: Secondary | ICD-10-CM | POA: Diagnosis not present

## 2020-12-03 DIAGNOSIS — Z20822 Contact with and (suspected) exposure to covid-19: Secondary | ICD-10-CM | POA: Diagnosis not present

## 2020-12-03 DIAGNOSIS — Z66 Do not resuscitate: Secondary | ICD-10-CM | POA: Diagnosis not present

## 2020-12-03 DIAGNOSIS — R739 Hyperglycemia, unspecified: Secondary | ICD-10-CM | POA: Diagnosis not present

## 2020-12-03 DIAGNOSIS — T68XXXA Hypothermia, initial encounter: Secondary | ICD-10-CM | POA: Diagnosis not present

## 2020-12-03 DIAGNOSIS — X58XXXA Exposure to other specified factors, initial encounter: Secondary | ICD-10-CM | POA: Diagnosis not present

## 2020-12-08 ENCOUNTER — Telehealth: Payer: Self-pay | Admitting: Internal Medicine

## 2020-12-08 NOTE — Telephone Encounter (Signed)
Patients son called in to cancel his mothers appointment due to her passing away on Saturday.

## 2020-12-09 ENCOUNTER — Other Ambulatory Visit: Payer: Self-pay | Admitting: Cardiovascular Disease

## 2020-12-29 DEATH — deceased

## 2021-01-05 ENCOUNTER — Ambulatory Visit: Payer: Medicare HMO | Admitting: Cardiovascular Disease

## 2021-01-05 ENCOUNTER — Ambulatory Visit: Payer: Medicare HMO | Admitting: Internal Medicine
